# Patient Record
Sex: Female | Born: 1935
Health system: Southern US, Community
[De-identification: ages and names within clinical notes are randomized; demographics above are authoritative.]

## PROBLEM LIST (undated history)

## (undated) DIAGNOSIS — G629 Polyneuropathy, unspecified: Secondary | ICD-10-CM

## (undated) DIAGNOSIS — R350 Frequency of micturition: Secondary | ICD-10-CM

## (undated) DIAGNOSIS — R269 Unspecified abnormalities of gait and mobility: Secondary | ICD-10-CM

## (undated) DIAGNOSIS — E7800 Pure hypercholesterolemia, unspecified: Secondary | ICD-10-CM

## (undated) DIAGNOSIS — F419 Anxiety disorder, unspecified: Secondary | ICD-10-CM

## (undated) DIAGNOSIS — I10 Essential (primary) hypertension: Secondary | ICD-10-CM

## (undated) HISTORY — DX: Pure hypercholesterolemia, unspecified: E78.00

## (undated) HISTORY — DX: Polyneuropathy, unspecified: G62.9

## (undated) HISTORY — DX: Frequency of micturition: R35.0

## (undated) HISTORY — DX: Unspecified abnormalities of gait and mobility: R26.9

## (undated) HISTORY — DX: Essential (primary) hypertension: I10

## (undated) HISTORY — PX: CARPAL TUNNEL RELEASE: SHX101

## (undated) HISTORY — PX: TONSILLECTOMY AND ADENOIDECTOMY: SHX28

## (undated) HISTORY — PX: OTHER SURGICAL HISTORY: SHX169

## (undated) HISTORY — DX: Anxiety disorder, unspecified: F41.9

## (undated) HISTORY — PX: GANGLION CYST EXCISION: SHX1691

## (undated) HISTORY — PX: EYE SURGERY: SHX253

---

## 2002-02-15 ENCOUNTER — Other Ambulatory Visit: Admission: RE | Admit: 2002-02-15 | Discharge: 2002-02-15 | Payer: Self-pay | Admitting: Unknown Physician Specialty

## 2006-04-28 ENCOUNTER — Ambulatory Visit: Payer: Self-pay | Admitting: Internal Medicine

## 2006-04-28 ENCOUNTER — Ambulatory Visit (HOSPITAL_COMMUNITY): Admission: RE | Admit: 2006-04-28 | Discharge: 2006-04-28 | Payer: Self-pay | Admitting: Internal Medicine

## 2013-03-08 ENCOUNTER — Ambulatory Visit: Payer: Medicare Other | Admitting: Neurology

## 2013-03-08 ENCOUNTER — Encounter: Payer: Self-pay | Admitting: Neurology

## 2013-03-08 ENCOUNTER — Ambulatory Visit (INDEPENDENT_AMBULATORY_CARE_PROVIDER_SITE_OTHER): Payer: Medicare Other | Admitting: Neurology

## 2013-03-08 VITALS — BP 161/81 | HR 60 | Ht 60.0 in | Wt 213.0 lb

## 2013-03-08 DIAGNOSIS — R209 Unspecified disturbances of skin sensation: Secondary | ICD-10-CM

## 2013-03-08 DIAGNOSIS — Z90710 Acquired absence of both cervix and uterus: Secondary | ICD-10-CM

## 2013-03-08 DIAGNOSIS — R269 Unspecified abnormalities of gait and mobility: Secondary | ICD-10-CM

## 2013-03-08 DIAGNOSIS — R200 Anesthesia of skin: Secondary | ICD-10-CM

## 2013-03-08 NOTE — Progress Notes (Signed)
[facility]  PATIENT: Nicole Yoder DOB: [birth year]  HISTORICAL    Nicole Yoder is a 77 years old right-handed Caucasian female, referred by her primary care physician Dr. Manuella Ghazi,  and podiatrist Dr. Blanch Media for evaluation of bilateral feet paresthesia, worsening gait difficulty  She had a past medical history of obesity, hypertension, hyperlipidemia, chronic bilateral shoulder pain, limited range of motion of bilateral shoulder  In 2009, she presented with bilateral feet paresthesia numbness tingling of her feet, she was seen by local neurologists, reported abnormal nerve conduction EMG, consistent with a sensory predominant peripheral neuropathy, there was no evidence of diabetes, no etiology found,  Over the past 5 years, her feet paresthesia has gradually getting worse, not involving her whole feet, numbness tingling burning pain, getting worse after standing for 20-30 minutes, or walking.  She also has low back pain, but denies shooting pain to bilateral lower extremity, past one year, she also complains of bilateral hand swelling, joints pain, difficulty to make a tight grip, bilateral fingertips paresthesia,  She has urinary urgency, nocturnal frequent urination, which has improved after taking VESIcare, is no longer taking that,  She complains of neck pain, cracking sounds in her neck when she turns around, reported abnormal MRI of cervical more than 2 years ago, I do not have the report  REVIEW OF SYSTEMS: Full 14 system review of systems performed and notable only for fatigue, spinning sensation, itching, snoring, feeling hot, cold, flushing, joints pain, swelling, cramps, achy muscles,numbness, weakness, dizziness, passing out, snoring, anxiety, too much sleep, decreased energy   ALLERGIES: Allergies  Allergen Reactions  . Celebrex (Celecoxib)   . Codeine   . Ivp Dye (Iodinated Diagnostic Agents)   . Lyrica (Pregabalin)   . Neurontin (Gabapentin)   .  Penicillins   . Statins   . Sulfa Antibiotics   . Tramadol     HOME MEDICATIONS:  PAST MEDICAL HISTORY: Past Medical History  Diagnosis Date  . High blood pressure   . High cholesterol   . Anxiety        Hypothyroidism  PAST SURGICAL HISTORY: Past Surgical History  Procedure Laterality Date  . Eye surgery    . Tonsillectomy and adenoidectomy    . Carpal tunnel release Bilateral   . Ganglion cyst excision    . Vaginal hysterectomy    . Broken wrist    . Broken ribs      FAMILY HISTORY: History reviewed. No pertinent family history.  SOCIAL HISTORY:  History   Social History  . Marital Status: Single    Spouse Name: N/A    Number of Children: 0  . Years of Education: 12   Occupational History  .      retired   Social History Main Topics  . Smoking status: Never Smoker   . Smokeless tobacco: Never Used  . Alcohol Use: No  . Drug Use: No  . Sexually Active: Not on file   Other Topics Concern  . Not on file   Social History Narrative   Patient is retired and lives at home with her friend. Patient has a high school education. Patient is from [place].      Right handed.     PHYSICAL EXAM  Filed Vitals:   03/08/13 1007  BP: 161/81  Pulse: 60  Height: 5' (1.524 m)  Weight: 213 lb (96.616 kg)    Not recorded    Body mass index is 41.6 kg/(m^2).  PHYSICAL EXAMINATOINS:  Generalized: In no acute distress  Neck: Supple, no carotid bruits   Cardiac: Regular rate rhythm  Pulmonary: Clear to auscultation bilaterally  Musculoskeletal: No deformity  Neurological examination  Mentation: Alert oriented to time, place, history taking, and causual conversation, obese  Cranial nerve II-XII: Pupils were equal round reactive to light extraocular movements were full, visual field were full on confrontational test. facial sensation and strength were normal. hearing was intact to finger rubbing bilaterally. Uvula tongue midline.  head turning and  shoulder shrug and were normal and symmetric.Tongue protrusion into cheek strength was normal.  Motor: limited range of motion of bilateral shoulder, no significant bilateral upper and lower extremity proximal and distal weakness  Sensory: length dependent decreased fine touch, pinprick to distal shin,  Absent toe vibratory sensation, vibratory sensation is present at ankle level, and preserved proprioception at toes.  Coordination: Normal finger to nose, heel-to-shin bilaterally there was no truncal ataxia  Gait: Rising up from seated position without assistance, normal stance, wide based, cautious, unsteady gait, Romberg signs: Negative  Deep tendon reflexes: Brachioradialis 2/2, biceps 2/2, triceps 2/2, patellar 2/2, Achilles 2/2, plantar responses were flexor bilaterally.     Meds ordered this encounter  Medications  . sertraline (ZOLOFT) 100 MG tablet    Sig: Take 100 mg by mouth daily.  Marland Kitchen atenolol (TENORMIN) 25 MG tablet    Sig: Take 25 mg by mouth daily.  . Cholecalciferol (VITAMIN D PO)    Sig: Take by mouth daily.  . naproxen (NAPROSYN) 500 MG tablet    Sig: Take 500 mg by mouth 2 (two) times daily with a meal.  . levothyroxine (SYNTHROID, LEVOTHROID) 75 MCG tablet    Sig: Take 75 mcg by mouth daily before breakfast.  . omeprazole (PRILOSEC) 20 MG capsule    Sig: Take 20 mg by mouth daily.  . diclofenac sodium (VOLTAREN) 1 % GEL    Sig: Apply topically as directed.   Assessment and plan:  77 years old Caucasian female, with past medical history of hypertension, obesity hyperlipidemia, presenting with more than 5 years history of bilateral feet paresthesia, progressively worsening, over past 1 year, she also complains of worsening gait difficulty, on examination, she has obesity, length dependent sensory changes, but hyperreflexia,  1 her gait difficulty multifactor , including aging, obesity, deconditioning, peripheral neuropathy, likely a component of cervical spondylitic  myelopathy.  2 complete evaluation with MRI of cervical,  3 EMG nerve conduction study 4.  physical therapy . 5 laboratory evaluations .   [facility] [address] [place], [place] [address] [phone]

## 2013-03-17 DIAGNOSIS — R269 Unspecified abnormalities of gait and mobility: Secondary | ICD-10-CM

## 2013-03-19 ENCOUNTER — Other Ambulatory Visit: Payer: Self-pay | Admitting: Diagnostic Neuroimaging

## 2013-03-19 DIAGNOSIS — Z90710 Acquired absence of both cervix and uterus: Secondary | ICD-10-CM

## 2013-03-20 ENCOUNTER — Encounter (INDEPENDENT_AMBULATORY_CARE_PROVIDER_SITE_OTHER): Payer: Medicare Other | Admitting: Radiology

## 2013-03-20 ENCOUNTER — Ambulatory Visit (INDEPENDENT_AMBULATORY_CARE_PROVIDER_SITE_OTHER): Payer: Medicare Other | Admitting: Neurology

## 2013-03-20 DIAGNOSIS — Z90710 Acquired absence of both cervix and uterus: Secondary | ICD-10-CM

## 2013-03-20 DIAGNOSIS — R209 Unspecified disturbances of skin sensation: Secondary | ICD-10-CM

## 2013-03-20 DIAGNOSIS — R269 Unspecified abnormalities of gait and mobility: Secondary | ICD-10-CM

## 2013-03-20 DIAGNOSIS — R200 Anesthesia of skin: Secondary | ICD-10-CM

## 2013-03-20 DIAGNOSIS — Z0289 Encounter for other administrative examinations: Secondary | ICD-10-CM

## 2013-03-20 NOTE — Progress Notes (Signed)
Quick Note:  Will review film and discuss findings on follow up visit. ______

## 2013-03-21 ENCOUNTER — Telehealth: Payer: Self-pay | Admitting: Neurology

## 2013-03-21 NOTE — Procedures (Signed)
History of present illness:  77 years old female, presenting with more than one year history of bilateral feet paresthesia, nerve conduction EMG to evaluate peripheral neuropathy, or lumbosacral radiculopathy  Nerve conduction study: Bilateral sural sensory responses were normal, right side has normal snap amplitude, left-sided has mildly decreased snap amplitude. Bilateral tibial motor responses were normal. Bilateral peroneal toEDB motor response showed borderline or mildly decreased C. map amplitude. Left median, ulnar sensory and motor responses were normal.  Electromyography:  Selected needle examination was performed at left lower extremity muscles, and that lumbosacral paraspinal muscles.  Needle examination of left tibialis anterior, tibialis posterior, medial gastrocnemius, vastus lateralis, biceps femoris short head, was normal,  There was no spontaneous activity at left lumbosacral paraspinal muscles left L4, L5, S1  In conclusion:  This is a slight abnormal study, there is evidence of slight length dependent axonal peripheral neuropathy, there was no evidence of left lumbosacral radiculopathy.

## 2013-03-21 NOTE — Telephone Encounter (Signed)
Laboratory evaluation reviewed, there was normal CBC, elevated cholesterol 254, LDL 166, normal CMP: TSH, free T4.

## 2013-03-22 ENCOUNTER — Telehealth: Payer: Self-pay | Admitting: Neurology

## 2013-03-22 LAB — IFE AND PE, SERUM
Albumin SerPl Elph-Mcnc: 3.7 g/dL (ref 3.2–5.6)
Albumin/Glob SerPl: 1.4 (ref 0.7–2.0)
Alpha2 Glob SerPl Elph-Mcnc: 0.7 g/dL (ref 0.4–1.2)
B-Globulin SerPl Elph-Mcnc: 1 g/dL (ref 0.6–1.3)
IgM (Immunoglobulin M), Srm: 91 mg/dL (ref 40–230)
Total Protein: 6.5 g/dL (ref 6.0–8.5)

## 2013-03-22 LAB — FOLATE: Folate: 11.1 ng/mL (ref >3.0)

## 2013-03-22 LAB — RPR: RPR: NONREACTIVE

## 2013-03-22 LAB — VITAMIN B12: Vitamin B-12: 218 pg/mL (ref 211–946)

## 2013-03-22 LAB — ANA: Anti Nuclear Antibody(ANA): NEGATIVE

## 2013-03-22 NOTE — Telephone Encounter (Signed)
Please call patient, lab showed mildly elevated crp, otherwise normal, not significant findings.  MRI cervical, degenerative disease, no change in treatment plan

## 2013-03-23 ENCOUNTER — Encounter: Payer: Self-pay | Admitting: Neurology

## 2013-03-26 ENCOUNTER — Telehealth: Payer: Self-pay | Admitting: Neurology

## 2013-03-26 NOTE — Telephone Encounter (Signed)
I called and gave the results of MRI, and labs to sister of pt, Comer Locket.  Pt is to call back if needed.   No other tests at this time.

## 2013-04-25 ENCOUNTER — Other Ambulatory Visit: Payer: Self-pay

## 2013-07-10 ENCOUNTER — Ambulatory Visit (INDEPENDENT_AMBULATORY_CARE_PROVIDER_SITE_OTHER): Payer: Medicare Other | Admitting: Neurology

## 2013-07-10 ENCOUNTER — Telehealth: Payer: Self-pay | Admitting: Neurology

## 2013-07-10 ENCOUNTER — Encounter: Payer: Self-pay | Admitting: Neurology

## 2013-07-10 VITALS — BP 179/76 | HR 65 | Ht 61.0 in | Wt 215.0 lb

## 2013-07-10 DIAGNOSIS — R209 Unspecified disturbances of skin sensation: Secondary | ICD-10-CM

## 2013-07-10 DIAGNOSIS — R269 Unspecified abnormalities of gait and mobility: Secondary | ICD-10-CM

## 2013-07-10 DIAGNOSIS — R200 Anesthesia of skin: Secondary | ICD-10-CM

## 2013-07-10 NOTE — Progress Notes (Signed)
[facility]  PATIENT: Nicole Yoder DOB: [birth year]  HISTORICAL    Symphoni is a 77 years old right-handed Caucasian female, referred by her primary care physician Dr. Manuella Ghazi,  and podiatrist Dr. Blanch Media for evaluation of bilateral feet paresthesia, worsening gait difficulty  She had a past medical history of obesity, hypertension, hyperlipidemia, chronic bilateral shoulder pain, limited range of motion of bilateral shoulder  In 2009, she presented with bilateral feet paresthesia numbness tingling of her feet, she was seen by local neurologists, reported abnormal nerve conduction EMG, consistent with a sensory predominant peripheral neuropathy, there was no evidence of diabetes, no etiology found,  Over the past 5 years, her feet paresthesia has gradually getting worse, not involving her whole feet, numbness tingling burning pain, getting worse after standing for 20-30 minutes, or walking.  She also has low back pain, but denies shooting pain to bilateral lower extremity, past one year, she also complains of bilateral hand swelling, joints pain, difficulty to make a tight grip, bilateral fingertips paresthesia,  She has urinary urgency, nocturnal frequent urination, which has improved after taking VESIcare, is no longer taking that,  She complains of neck pain, cracking sounds in her neck when she turns around, reported abnormal MRI of cervical more than 2 years ago, I do not have the report  UPDATE 07/10/2013:  I have reviewed MRI cervical with her,  C5-6: uncovertebral joint hypertrophy and facet hypertrophy with moderate spinal stenosis and severe biforaminal foraminal stenosis.  C4-5, C6-7: uncovertebral joint hypertrophy and facet hypertrophy with mild-moderate spinal stenosis and severe biforaminal foraminal stenosis.  C3-4: uncovertebral joint hypertrophy and facet hypertrophy with mild spinal stenosis and severe biforaminal foraminal stenosis  She continued to  have mild gait difficulty, left foot hurts.  She denies bowel and bladder incontinence.   REVIEW OF SYSTEMS: Full 14 system review of systems performed and notable only for fatigue, spinning sensation, itching, snoring, feeling hot, cold, flushing, joints pain, swelling, cramps, achy muscles,numbness, weakness, dizziness, passing out, snoring, anxiety, too much sleep, decreased energy   ALLERGIES: Allergies  Allergen Reactions  . Celebrex [Celecoxib]   . Codeine   . Ivp Dye [Iodinated Diagnostic Agents]   . Lyrica [Pregabalin]   . Neurontin [Gabapentin]   . Penicillins   . Statins   . Sulfa Antibiotics   . Tramadol     HOME MEDICATIONS:  PAST MEDICAL HISTORY: Past Medical History  Diagnosis Date  . High blood pressure   . High cholesterol   . Anxiety        Hypothyroidism  PAST SURGICAL HISTORY: Past Surgical History  Procedure Laterality Date  . Eye surgery    . Tonsillectomy and adenoidectomy    . Carpal tunnel release Bilateral   . Ganglion cyst excision    . Vaginal hysterectomy    . Broken wrist    . Broken ribs      FAMILY HISTORY: History reviewed. No pertinent family history.  SOCIAL HISTORY:  History   Social History  . Marital Status: Single    Spouse Name: N/A    Number of Children: 0  . Years of Education: 12   Occupational History  .      retired   Social History Main Topics  . Smoking status: Never Smoker   . Smokeless tobacco: Never Used  . Alcohol Use: No  . Drug Use: No  . Sexual Activity: Not on file   Other Topics Concern  . Not on file   Social History  Narrative   Patient is retired and lives at home with her friend. Patient has a high school education. Patient is from [place].      Right handed.'   Caffeine- sometimes tea.     PHYSICAL EXAM  Filed Vitals:   07/10/13 1130  BP: 179/76  Pulse: 65  Height: 5\' 1"  (1.549 m)  Weight: 215 lb (97.523 kg)    Not recorded    Body mass index is 40.64  kg/(m^2).  PHYSICAL EXAMINATOINS:  Generalized: In no acute distress  Neck: Supple, no carotid bruits   Cardiac: Regular rate rhythm  Pulmonary: Clear to auscultation bilaterally  Musculoskeletal: No deformity  Neurological examination  Mentation: Alert oriented to time, place, history taking, and causual conversation, obese  Cranial nerve II-XII: Pupils were equal round reactive to light extraocular movements were full, visual field were full on confrontational test. facial sensation and strength were normal. hearing was intact to finger rubbing bilaterally. Uvula tongue midline.  head turning and shoulder shrug and were normal and symmetric.Tongue protrusion into cheek strength was normal.  Motor: limited range of motion of bilateral shoulder, no significant bilateral upper and lower extremity proximal and distal weakness  Sensory: length dependent decreased fine touch, pinprick to distal shin,  Absent toe vibratory sensation, vibratory sensation is present at ankle level, and preserved proprioception at toes.  Coordination: Normal finger to nose, heel-to-shin bilaterally there was no truncal ataxia  Gait: Rising up from seated position without assistance, normal stance, wide based, cautious, unsteady gait, Romberg signs: Negative  Deep tendon reflexes: Brachioradialis 3/3  biceps 3/3, triceps 2/2, patellar 3/3, Achilles trace , plantar responses were flexor bilaterally.     Meds ordered this encounter  Medications  . clindamycin (CLEOCIN) 150 MG capsule    Sig: Take 150 mg by mouth daily.   Assessment and plan:  77 years old Caucasian female, with past medical history of hypertension, obesity, hyperlipidemia, presenting with more than 5 years history of bilateral feet paresthesia, progressively worsening, over past 1 year, she also complains of worsening gait difficulty, on examination, she has obesity, length dependent sensory changes, but hyperreflexia, there is evidence  of cervical spondylitic disease, mild to moderate canal stenosis,   1 her gait difficulty multifactor , including aging, obesity, deconditioning, peripheral neuropathy, component of cervical spondylitic myelopathy.  2 . Continue morrhuate exercise, 3 laboratory showed multilevel B12, she should take B12 supplement.      [facility] [address], [place], [place] [address] [phone]

## 2013-07-10 NOTE — Telephone Encounter (Signed)
Please call patient for low b12, she needs overcounter b12 supplement.

## 2013-07-11 NOTE — Telephone Encounter (Signed)
I have called [facility],  she should take over counter vitamin B12 supplement. 1000 microgram one tab qday.

## 2013-07-20 ENCOUNTER — Encounter: Payer: Self-pay | Admitting: Nurse Practitioner

## 2013-07-20 DIAGNOSIS — R269 Unspecified abnormalities of gait and mobility: Secondary | ICD-10-CM

## 2013-07-24 NOTE — Telephone Encounter (Signed)
I called and LMVM for pt to return call.  Continue PT at [facility]?

## 2013-07-25 NOTE — Telephone Encounter (Signed)
Pt returned call yesterday.   I called today and no answer.

## 2013-07-25 NOTE — Telephone Encounter (Signed)
Pt returned call.  She had PT at [facility] and did note some improvement while doing this.   She is asking about more PT.  Looking at Dr. Rhea Belton note she did want her to continue.  Insurance coverage?  I told pt that order can be placed and sent to [facility].  They will confirm if this would be covered.  She was ok to proceed.

## 2013-07-26 ENCOUNTER — Other Ambulatory Visit: Payer: Self-pay

## 2013-07-30 ENCOUNTER — Telehealth: Payer: Self-pay | Admitting: Neurology

## 2013-07-30 NOTE — Telephone Encounter (Signed)
Called and left VM message for more information

## 2013-08-11 ENCOUNTER — Encounter: Payer: Self-pay | Admitting: Nurse Practitioner

## 2014-01-08 ENCOUNTER — Ambulatory Visit (INDEPENDENT_AMBULATORY_CARE_PROVIDER_SITE_OTHER): Payer: Medicare Other | Admitting: Nurse Practitioner

## 2014-01-08 ENCOUNTER — Ambulatory Visit: Payer: Medicare Other | Admitting: Nurse Practitioner

## 2014-01-08 ENCOUNTER — Encounter: Payer: Self-pay | Admitting: Nurse Practitioner

## 2014-01-08 VITALS — BP 151/67 | HR 65 | Ht 61.0 in | Wt 218.0 lb

## 2014-01-08 DIAGNOSIS — R209 Unspecified disturbances of skin sensation: Secondary | ICD-10-CM

## 2014-01-08 DIAGNOSIS — R200 Anesthesia of skin: Secondary | ICD-10-CM

## 2014-01-08 DIAGNOSIS — M4802 Spinal stenosis, cervical region: Secondary | ICD-10-CM | POA: Insufficient documentation

## 2014-01-08 DIAGNOSIS — R269 Unspecified abnormalities of gait and mobility: Secondary | ICD-10-CM

## 2014-01-08 NOTE — Patient Instructions (Signed)
Will set up for PT for cervical region Will set up for sleep study Continue to use cane for ambulation F/U in 6 months

## 2014-01-08 NOTE — Progress Notes (Signed)
[facility]  PATIENT: Nicole Yoder DOB: 1936/06/19   REASON FOR VISIT: Follow up for gait abnormality, peripheral neuropathy, cervical stenosis   HISTORY OF PRESENT ILLNESS: Nicole Yoder, 78 year old female returns for followup. She was last seen in this office 07/10/2013 by  Dr. Krista Blue. She has a five-year history of bilateral feet paresthesias and more difficulty with her gait EMG consistent with sensory predominant peripheral neuropathy without evidence of diabetes, no etiology was found. She has also had low vitamin B12 level and is taking a supplement. She also complains of neck stiffness and has had some problems with her driving and the ability to turn her neck . Her MRI of the neck  reviewed by Dr. Krista Blue shows mild to moderate cervical stenosis. She denies bowel incontinence, she has some stress incontinence. Her paresthesias in the feet are about the same. She had a fall this month and is now using a cane. She gets no regular exercise. She also complains of daytime drowsiness and says she snores at night. She claims she had a sleep study 10 years ago and was told that she needed to lose weight for treatment. She returns for reevaluation  HISTORY: evaluation of bilateral feet paresthesia, worsening gait difficulty  She had a past medical history of obesity, hypertension, hyperlipidemia, chronic bilateral shoulder pain, limited range of motion of bilateral shoulder  In 2009, she presented with bilateral feet paresthesia numbness tingling of her feet, she was seen by local neurologists, reported abnormal nerve conduction EMG, consistent with a sensory predominant peripheral neuropathy, there was no evidence of diabetes, no etiology found,  Over the past 5 years, her feet paresthesia has gradually getting worse, not involving her whole feet, numbness tingling burning pain, getting worse after standing for 20-30 minutes, or walking.  She also has low back pain, but denies  shooting pain to bilateral lower extremity, past one year, she also complains of bilateral hand swelling, joints pain, difficulty to make a tight grip, bilateral fingertips paresthesia,  She has urinary urgency, nocturnal frequent urination, which has improved after taking VESIcare, is no longer taking that,  She complains of neck pain, cracking sounds in her neck when she turns around, reported abnormal MRI of cervical more than 2 years ago, I do not have the report  UPDATE 07/10/2013:  I have reviewed MRI cervical with her, C5-6: uncovertebral joint hypertrophy and facet hypertrophy with moderate spinal stenosis and severe biforaminal foraminal stenosis. C4-5, C6-7: uncovertebral joint hypertrophy and facet hypertrophy with mild-moderate spinal stenosis and severe biforaminal foraminal stenosis. C3-4: uncovertebral joint hypertrophy and facet hypertrophy with mild spinal stenosis and severe biforaminal foraminal stenosis  She continued to have mild gait difficulty, left foot hurts. She denies bowel and bladder incontinence.   REVIEW OF SYSTEMS: Full 14 system review of systems performed and notable only for those listed, all others are neg:  Constitutional: Fatigue and Cardiovascular: Leg swelling Ear/Nose/Throat: N/A  Skin: N/A  Eyes: N/A  Respiratory: N/A  Gastroitestinal: Stress incontinence  Hematology/Lymphatic: N/A  Endocrine: Intolerance to heat  Musculoskeletal: Joint pain walking difficulty, neck pain  Allergy/Immunology: N/A  Neurological: Numbness, weakness Psychiatric: N/A   ALLERGIES: Allergies  Allergen Reactions  . Prednisone Other (See Comments)    High blood pressure   . Celebrex [Celecoxib]   . Codeine   . Ivp Dye [Iodinated Diagnostic Agents]   . Lyrica [Pregabalin]   . Neurontin [Gabapentin]   . Penicillins   . Statins   . Sulfa Antibiotics   .  Tramadol     HOME MEDICATIONS: Outpatient Prescriptions Prior to Visit  Medication Sig Dispense Refill  .  atenolol (TENORMIN) 25 MG tablet Take 25 mg by mouth daily.      . Cholecalciferol (VITAMIN D PO) Take by mouth daily.      . diclofenac sodium (VOLTAREN) 1 % GEL Apply topically as directed.      Marland Kitchen levothyroxine (SYNTHROID, LEVOTHROID) 75 MCG tablet Take 75 mcg by mouth daily before breakfast.      . naproxen (NAPROSYN) 500 MG tablet Take 500 mg by mouth 2 (two) times daily with a meal.      . omeprazole (PRILOSEC) 20 MG capsule Take 20 mg by mouth daily.      . sertraline (ZOLOFT) 100 MG tablet Take 100 mg by mouth daily.      . clindamycin (CLEOCIN) 150 MG capsule Take 150 mg by mouth daily.       No facility-administered medications prior to visit.    PAST MEDICAL HISTORY: Past Medical History  Diagnosis Date  . High blood pressure   . High cholesterol   . Anxiety     PAST SURGICAL HISTORY: Past Surgical History  Procedure Laterality Date  . Eye surgery    . Tonsillectomy and adenoidectomy    . Carpal tunnel release Bilateral   . Ganglion cyst excision    . Vaginal hysterectomy    . Broken wrist    . Broken ribs      FAMILY HISTORY: History reviewed. No pertinent family history.  SOCIAL HISTORY: History   Social History  . Marital Status: Single    Spouse Name: N/A    Number of Children: 0  . Years of Education: 12   Occupational History  .      retired   Social History Main Topics  . Smoking status: Never Smoker   . Smokeless tobacco: Never Used  . Alcohol Use: No  . Drug Use: No  . Sexual Activity: Not on file   Other Topics Concern  . Not on file   Social History Narrative   Patient is retired and lives at home with her friend. Patient has a high school education. Patient is from [place].      Right handed.'   Caffeine- sometimes tea.     PHYSICAL EXAM  Filed Vitals:   01/08/14 0913  BP: 151/67  Pulse: 65  Height: 5\' 1"  (1.549 m)  Weight: 218 lb (98.884 kg)   Body mass index is 41.21 kg/(m^2).  Generalized: Well developed, morbidly  obese female in no acute distress  Head: normocephalic and atraumatic,. mallopatti 4 Neck: Decreased range of motion , no carotid bruits , neck size 17.  Cardiac: Regular rate rhythm, no murmur  Musculoskeletal: No deformity   Neurological examination   Mentation: Alert oriented to time, place, history taking. Follows all commands speech and language fluent. ESS 12.   Cranial nerve II-XII: Pupils were equal round reactive to light extraocular movements were full, visual field were full on confrontational test. Facial sensation and strength were normal. hearing was intact to finger rubbing bilaterally. Uvula tongue midline. Tongue protrusion into cheek strength was normal. Motor: Limited range of motion of bilateral shoulder,  normal bulk and tone, full strength in the BUE, BLE, . No focal weakness Sensory: decreased fine touch and pinprick to distal shin. Absent vibratory to ankles and preserved proprioception.  Coordination: finger-nose-finger, heel-to-shin bilaterally, no dysmetria Reflexes: Brachioradialis 3/3, biceps 3/3, triceps 2/2, patellar 2/2, Achilles trace  plantar responses were flexor bilaterally. Gait and Station: Rising up from seated position without assistance, wide based  stance,  cautious unsteady gait, Romberg negative  DIAGNOSTIC DATA (LABS, IMAGING, TESTING)  ASSESSMENT AND PLAN  78 y.o. year old female  has a past medical history of High blood pressure; High cholesterol; and obesity, bilateral feet paresthesias which have worsened over 5 years with length dependent sensory changes,  hyperreflexia, gait abnormality, daytime drowsiness and cervical spondylitic disease mild to moderate canal stenosis.  Will set up for PT for cervical region Will set up for sleep study Continue to use cane for ambulation F/U in 6 months Nicole Yoder, [facility], [facility] [address] [place], [place] [address] [phone]

## 2014-01-10 ENCOUNTER — Encounter: Payer: Self-pay | Admitting: Nurse Practitioner

## 2014-01-14 ENCOUNTER — Encounter: Payer: Self-pay | Admitting: Nurse Practitioner

## 2014-01-17 ENCOUNTER — Telehealth: Payer: Self-pay | Admitting: Neurology

## 2014-01-17 DIAGNOSIS — G4733 Obstructive sleep apnea (adult) (pediatric): Secondary | ICD-10-CM

## 2014-01-17 DIAGNOSIS — R400 Somnolence: Secondary | ICD-10-CM

## 2014-01-17 NOTE — Telephone Encounter (Signed)
Sleep study request review: This patient has an underlying medical history of BC, pressure, hyperlipidemia, and is referred by Dr. Krista Blue and Cecille Rubin for an attended sleep study due to a report of snoring, prior diagnosis of sleep apnea and excessive daytime somnolence with an Epworth of 19. I will order a split-night sleep study and see the patient in sleep medicine consultation afterwards. Star Age, MD, PhD [facility])

## 2014-01-17 NOTE — Telephone Encounter (Signed)
Nicole Sauer Martin,NP/Dr. Krista Blue.. refers patient for attended sleep study.  Height: 5'1  Weight: 218 lbs.  BMI: 41.21  Past Medical History:  High blood pressure  High cholesterol  Anxiety   Sleep Symptoms:  She also complains of daytime drowsiness and says she snores at night. She claims she had a sleep study 10 years ago and was told that she needed to lose weight for treatment.  Epworth Score: 19 ( I called the patient to get her score).  Medications: atenolol (TENORMIN) 25 MG tablet  Take 25 mg by mouth daily.  .  Cholecalciferol (VITAMIN D PO)  Take by mouth daily.  .  diclofenac sodium (VOLTAREN) 1 % GEL  Apply topically as directed.  Marland Kitchen  levothyroxine (SYNTHROID, LEVOTHROID) 75 MCG tablet  Take 75 mcg by mouth daily before breakfast.  .  naproxen (NAPROSYN) 500 MG tablet  Take 500 mg by mouth 2 (two) times daily with a meal.  .  omeprazole (PRILOSEC) 20 MG capsule  Take 20 mg by mouth daily.  .  sertraline (ZOLOFT) 100 MG tablet  Take 100 mg by mouth daily.  .  clindamycin (CLEOCIN) 150 MG capsule  Take 150 mg by mouth daily.     Insurance: Medicare/[employer]  [name]'s Assessment and Plan: 78 y.o. year old female has a past medical history of High blood pressure; High cholesterol; and obesity, bilateral feet paresthesias which have worsened over 5 years with length dependent sensory changes, hyperreflexia, gait abnormality, daytime drowsiness and cervical spondylitic disease mild to moderate canal stenosis.   Will set up for PT for cervical region  Will set up for sleep study  Continue to use cane for ambulation  F/U in 6 months      Please review patient information and submit instructions for scheduling and orders for sleep technologist.  Thank you!

## 2014-02-06 ENCOUNTER — Telehealth: Payer: Self-pay | Admitting: Nurse Practitioner

## 2014-02-06 NOTE — Telephone Encounter (Signed)
Called [place] at [facility] and she stated that she just wanted Hoyle Sauer, NP be aware that pt's neck is getting better and they are going to continue to work with her but they believe pt might have a rotator cuff tear. They also stated that they had faxed over paperwork and wanted to know if our office has received it yet. FYI

## 2014-02-06 NOTE — Telephone Encounter (Signed)
Reviewed PT POC and faxed back

## 2014-02-06 NOTE — Telephone Encounter (Signed)
Pt is seen there and her neck is getting better but they think she has a rotator cuff tear please call

## 2014-02-14 ENCOUNTER — Telehealth: Payer: Self-pay | Admitting: Nurse Practitioner

## 2014-02-14 NOTE — Telephone Encounter (Signed)
Pt called to cancel her sleep study appointment because she says that her primary care physician told her that she didn't need it.  Advised patient that notification would be sent to the referring provider.

## 2014-06-25 ENCOUNTER — Encounter (INDEPENDENT_AMBULATORY_CARE_PROVIDER_SITE_OTHER): Payer: Self-pay | Admitting: *Deleted

## 2014-07-10 ENCOUNTER — Ambulatory Visit (INDEPENDENT_AMBULATORY_CARE_PROVIDER_SITE_OTHER): Payer: Self-pay | Admitting: Internal Medicine

## 2014-07-11 ENCOUNTER — Ambulatory Visit (INDEPENDENT_AMBULATORY_CARE_PROVIDER_SITE_OTHER): Payer: Medicare Other | Admitting: Nurse Practitioner

## 2014-07-11 ENCOUNTER — Encounter: Payer: Self-pay | Admitting: Nurse Practitioner

## 2014-07-11 VITALS — BP 128/65 | HR 64 | Temp 97.0°F | Ht 61.0 in | Wt 212.0 lb

## 2014-07-11 DIAGNOSIS — R269 Unspecified abnormalities of gait and mobility: Secondary | ICD-10-CM

## 2014-07-11 DIAGNOSIS — M4802 Spinal stenosis, cervical region: Secondary | ICD-10-CM

## 2014-07-11 DIAGNOSIS — R200 Anesthesia of skin: Secondary | ICD-10-CM

## 2014-07-11 NOTE — Patient Instructions (Addendum)
I would like you to start back with Physical Therapy for Gait and balance training.  You may do this in [place].    Continue to be very careful and use your cane at all times for safety.  Please see Dr. Derenda Mis for your hot flashes, as it is unusual to start having them again at your age.  Follow up with Dr. Krista Blue in 3-4 months, sooner as needed.

## 2014-07-11 NOTE — Progress Notes (Signed)
PATIENT: Nicole Yoder DOB: [birth year]  REASON FOR VISIT: Follow up for gait abnormality, peripheral neuropathy, cervical stenosis  HISTORY FROM: patient  HISTORY OF PRESENT ILLNESS: Nicole Yoder, 78 year old female returns for followup. She was last seen in this office 07/10/2013 by Dr. Krista Blue. She has a five-year history of bilateral feet paresthesias and more difficulty with her gait EMG consistent with sensory predominant peripheral neuropathy without evidence of diabetes, no etiology was found.   UPDATE 07/11/14 (LL): Since last visit patient went through physical therapy for her neck stiffness and decreased range of motion which she thinks helps somewhat. The therapist thought she may have a left rotator cuff tear. She was seen by orthopedics who told her she has arthritis in the joint. She continues to feel very off balance and is using a cane now at all times. She cancelled her sleep study because she states that her PCP told her she didn't need it. She states that she really does not want to do it anyway. She states that if she is on her feet prolonged period of time that she feels like her feet will give out under her. She has burning pain in the left foot more than the right but only during the day, not during the nighttime when she's in bed. She states that she sleeps very well. She states she does not exercise, in the past she enjoyed water aerobics classes but now does not want to participate because she fears she could not control her urine incontinence. She denies any fall since last visit. She states she started experiencing hot flashes about a month ago which are intermittent but puzzling because she had gone many years without them and has been off hormone replacement for many years as well. Her TSH was checked recently and normal.  UPDATE 01/08/14 (CM): She has also had low vitamin B12 level and is taking a supplement. She also complains of neck stiffness and has had some  problems with her driving and the ability to turn her neck . Her MRI of the neck reviewed by Dr. Krista Blue shows mild to moderate cervical stenosis. She denies bowel incontinence, she has some stress incontinence. Her paresthesias in the feet are about the same. She had a fall this month and is now using a cane. She gets no regular exercise. She also complains of daytime drowsiness and says she snores at night. She claims she had a sleep study 10 years ago and was told that she needed to lose weight for treatment. She returns for reevaluation.  UPDATE 07/10/2013 (YY):  I have reviewed MRI cervical with her, C5-6: uncovertebral joint hypertrophy and facet hypertrophy with moderate spinal stenosis and severe biforaminal foraminal stenosis. C4-5, C6-7: uncovertebral joint hypertrophy and facet hypertrophy with mild-moderate spinal stenosis and severe biforaminal foraminal stenosis. C3-4: uncovertebral joint hypertrophy and facet hypertrophy with mild spinal stenosis and severe biforaminal foraminal stenosis  She continued to have mild gait difficulty, left foot hurts. She denies bowel and bladder incontinence.   HISTORY: evaluation of bilateral feet paresthesia, worsening gait difficulty  She had a past medical history of obesity, hypertension, hyperlipidemia, chronic bilateral shoulder pain, limited range of motion of bilateral shoulder  In 2009, she presented with bilateral feet paresthesia numbness tingling of her feet, she was seen by local neurologists, reported abnormal nerve conduction EMG, consistent with a sensory predominant peripheral neuropathy, there was no evidence of diabetes, no etiology found,  Over the past 5 years, her feet paresthesia has  gradually getting worse, not involving her whole feet, numbness tingling burning pain, getting worse after standing for 20-30 minutes, or walking.  She also has low back pain, but denies shooting pain to bilateral lower extremity, past one year, she also  complains of bilateral hand swelling, joints pain, difficulty to make a tight grip, bilateral fingertips paresthesia,  She has urinary urgency, nocturnal frequent urination, which has improved after taking VESIcare, is no longer taking that,  She complains of neck pain, cracking sounds in her neck when she turns around, reported abnormal MRI of cervical more than 2 years ago, I do not have the report   REVIEW OF SYSTEMS: Full 14 system review of systems performed and notable only for:    ALLERGIES: Allergies  Allergen Reactions  . Prednisone Other (See Comments)    High blood pressure   . Celebrex [Celecoxib]   . Codeine   . Ivp Dye [Iodinated Diagnostic Agents]   . Lyrica [Pregabalin]   . Neurontin [Gabapentin]   . Penicillins   . Statins   . Sulfa Antibiotics   . Tramadol     HOME MEDICATIONS: Outpatient Prescriptions Prior to Visit  Medication Sig Dispense Refill  . atenolol (TENORMIN) 25 MG tablet Take 25 mg by mouth daily.      . Cholecalciferol (VITAMIN D PO) Take 1,000 Units by mouth daily.       . Cyanocobalamin (VITAMIN B 12) 100 MCG LOZG Take 1,000 mcg by mouth daily.      . diclofenac sodium (VOLTAREN) 1 % GEL Apply topically as directed.      Marland Kitchen levothyroxine (SYNTHROID, LEVOTHROID) 75 MCG tablet Take 75 mcg by mouth daily before breakfast.      . sertraline (ZOLOFT) 100 MG tablet Take 100 mg by mouth daily.      Marland Kitchen allopurinol (ZYLOPRIM) 300 MG tablet       . naproxen (NAPROSYN) 500 MG tablet Take 500 mg by mouth 2 (two) times daily with a meal.      . omeprazole (PRILOSEC) 20 MG capsule Take 20 mg by mouth daily.       No facility-administered medications prior to visit.    PHYSICAL EXAM Filed Vitals:   07/11/14 0959  BP: 128/65  Pulse: 64  Temp: 97 F (36.1 C)  TempSrc: Oral  Height: 5\' 1"  (1.549 m)  Weight: 212 lb (96.163 kg)   Body mass index is 40.08 kg/(m^2).  Generalized: Well developed, morbidly obese female in no acute distress  Head:  normocephalic and atraumatic,. mallopatti 4  Neck: Decreased range of motion, no carotid bruits , neck size 17.  Cardiac: Regular rate rhythm, no murmur  Musculoskeletal: No deformity   Neurological examination  Mentation: Alert oriented to time, place, history taking. Follows all commands speech and language fluent.  Cranial nerve II-XII: Pupils were equal round reactive to light extraocular movements were full, visual field were full on confrontational test. Facial sensation and strength were normal. hearing was intact to finger rubbing bilaterally. Uvula tongue midline. Tongue protrusion into cheek strength was normal.  Motor: Limited range of motion of bilateral shoulder, normal bulk and tone, full strength in the BUE, BLE, . No focal weakness  Sensory: decreased fine touch and pinprick to distal shin. Absent vibratory to ankles and preserved proprioception.  Coordination: finger-nose-finger, heel-to-shin bilaterally, no dysmetria  Reflexes: Brachioradialis 3/3, biceps 3/3, triceps 2/2, patellar 2/2, Achilles trace plantar responses were flexor bilaterally.  Gait and Station: Rising up from seated position without assistance, wide  based stance, cautious unsteady gait, Romberg negative   ASSESSMENT: 78 y.o. year old female has a past medical history of High blood pressure; High cholesterol; and obesity, bilateral feet paresthesias which have worsened over 5 years with length dependent sensory changes, hyperreflexia, gait abnormality, daytime drowsiness and cervical spondylitic disease mild to moderate canal stenosis.   PLAN: I would like you to start back with Physical Therapy for Gait and balance training.  Continue to be very careful and use your cane at all times for safety. Please see your gynecologist Dr. Derenda Mis for your hot flashes, as it is unusual to start having them again at your age. Follow up with Dr. Krista Blue in 3-4 months, sooner as needed.  Orders Placed This Encounter    Procedures  . AMB referral to gait training for fall prevention   LYNN E. LAM, MSN, FNP-BC, A/GNP-C 07/11/2014, 11:06 AM [facility] [address] [place], [place] [address] [phone]  Note: This document was prepared with digital dictation and possible smart phrase technology. Any transcriptional errors that result from this process are unintentional.

## 2014-07-12 ENCOUNTER — Telehealth: Payer: Self-pay | Admitting: Nurse Practitioner

## 2014-07-12 NOTE — Telephone Encounter (Signed)
Patient calling to check on the status of her physical therapy referral to [facility], please return call and advise.

## 2014-07-18 NOTE — Telephone Encounter (Signed)
Spoke with sister (Irene-DPR) and inform that the referral to [facility] hospital has been sent on 07/15/14 and she said that she would give message to patient

## 2014-07-30 ENCOUNTER — Ambulatory Visit (INDEPENDENT_AMBULATORY_CARE_PROVIDER_SITE_OTHER): Payer: Medicare Other | Admitting: Internal Medicine

## 2014-07-30 ENCOUNTER — Encounter (INDEPENDENT_AMBULATORY_CARE_PROVIDER_SITE_OTHER): Payer: Self-pay | Admitting: Internal Medicine

## 2014-07-30 ENCOUNTER — Other Ambulatory Visit (INDEPENDENT_AMBULATORY_CARE_PROVIDER_SITE_OTHER): Payer: Self-pay | Admitting: *Deleted

## 2014-07-30 ENCOUNTER — Encounter (INDEPENDENT_AMBULATORY_CARE_PROVIDER_SITE_OTHER): Payer: Self-pay | Admitting: *Deleted

## 2014-07-30 VITALS — BP 134/70 | HR 72 | Temp 98.1°F | Ht 61.0 in | Wt 212.8 lb

## 2014-07-30 DIAGNOSIS — R1314 Dysphagia, pharyngoesophageal phase: Secondary | ICD-10-CM | POA: Insufficient documentation

## 2014-07-30 DIAGNOSIS — R1310 Dysphagia, unspecified: Secondary | ICD-10-CM

## 2014-07-30 NOTE — Progress Notes (Signed)
   Subjective:    Patient ID: Nicole Yoder, female    DOB: July 11, 1936, 78 y.o.   MRN: [id number]  HPI Referred to our office bvy Dr. Adriana Reams for dysphagia. She tell me she is having trouble swallowing her pills. Occasionally she will have trouble swallowing breads.  She tells me she has no trouble swallowing meats, though she rarely eats it. Symptoms for about a year.  She does have acid reflux 2-3 times a week at least. She avoids fried foods and spicy foods. Appetite is good.She has lost about 8-9 pounds over the past 8 months unintentional. . No abdominal pain.  She usually a BM daily.     Review of Systems Past Medical History  Diagnosis Date  . High blood pressure   . High cholesterol   . Anxiety     Past Surgical History  Procedure Laterality Date  . Eye surgery    . Tonsillectomy and adenoidectomy    . Carpal tunnel release Bilateral   . Ganglion cyst excision    . Broken wrist    . Broken ribs      Allergies  Allergen Reactions  . Prednisone Other (See Comments)    High blood pressure   . Celebrex [Celecoxib]   . Codeine   . Ivp Dye [Iodinated Diagnostic Agents]   . Lyrica [Pregabalin]   . Neurontin [Gabapentin]   . Penicillins   . Statins   . Sulfa Antibiotics   . Tramadol     Current Outpatient Prescriptions on File Prior to Visit  Medication Sig Dispense Refill  . atenolol (TENORMIN) 25 MG tablet Take 25 mg by mouth daily.    . Cholecalciferol (VITAMIN D PO) Take 1,000 Units by mouth daily.     . Cyanocobalamin (VITAMIN B 12) 100 MCG LOZG Take 1,000 mcg by mouth daily.    . diclofenac sodium (VOLTAREN) 1 % GEL Apply topically as directed.    Marland Kitchen levothyroxine (SYNTHROID, LEVOTHROID) 75 MCG tablet Take 75 mcg by mouth daily before breakfast.    . naproxen sodium (ALEVE) 220 MG tablet Take 440 mg by mouth 2 (two) times daily with a meal.     . sertraline (ZOLOFT) 100 MG tablet Take 100 mg by mouth daily.     No current facility-administered  medications on file prior to visit.        Objective:   Physical Exam  Filed Vitals:   07/30/14 1104  Height: 5\' 1"  (1.549 m)  Weight: 212 lb 12.8 oz (96.525 kg)   Alert and oriented. Skin warm and dry. Oral mucosa is moist.   . Sclera anicteric, conjunctivae is pink. Thyroid not enlarged. No cervical lymphadenopathy. Lungs clear. Heart regular rate and rhythm.  Abdomen is soft. Bowel sounds are positive. No hepatomegaly. No abdominal masses felt. No tenderness.  No edema to lower extremities.        Assessment & Plan:  Solid and pill dysphagia. Web needs to be ruled out. EGD/ED GERD Continue the Omeprazole daily 30 minutes before breakfast.

## 2014-07-30 NOTE — Patient Instructions (Signed)
EGD/ED. The risks and benefits such as perforation, bleeding, and infection were reviewed with the patient and is agreeable. 

## 2014-08-22 ENCOUNTER — Ambulatory Visit (HOSPITAL_COMMUNITY)
Admission: RE | Admit: 2014-08-22 | Discharge: 2014-08-22 | Disposition: A | Discharge status: Home / Self Care | Payer: Medicare Other | Source: Ambulatory Visit | Attending: Internal Medicine | Admitting: Internal Medicine

## 2014-08-22 ENCOUNTER — Encounter (HOSPITAL_COMMUNITY)
Admission: RE | Disposition: A | Discharge status: Home / Self Care | Payer: Self-pay | Source: Ambulatory Visit | Attending: Internal Medicine

## 2014-08-22 ENCOUNTER — Encounter (HOSPITAL_COMMUNITY): Payer: Self-pay | Admitting: *Deleted

## 2014-08-22 DIAGNOSIS — I10 Essential (primary) hypertension: Secondary | ICD-10-CM | POA: Diagnosis not present

## 2014-08-22 DIAGNOSIS — K210 Gastro-esophageal reflux disease with esophagitis: Secondary | ICD-10-CM | POA: Insufficient documentation

## 2014-08-22 DIAGNOSIS — K2960 Other gastritis without bleeding: Secondary | ICD-10-CM | POA: Diagnosis not present

## 2014-08-22 DIAGNOSIS — R1310 Dysphagia, unspecified: Secondary | ICD-10-CM | POA: Insufficient documentation

## 2014-08-22 DIAGNOSIS — Z79899 Other long term (current) drug therapy: Secondary | ICD-10-CM | POA: Diagnosis not present

## 2014-08-22 DIAGNOSIS — K2971 Gastritis, unspecified, with bleeding: Secondary | ICD-10-CM

## 2014-08-22 DIAGNOSIS — K289 Gastrojejunal ulcer, unspecified as acute or chronic, without hemorrhage or perforation: Secondary | ICD-10-CM

## 2014-08-22 DIAGNOSIS — K449 Diaphragmatic hernia without obstruction or gangrene: Secondary | ICD-10-CM

## 2014-08-22 DIAGNOSIS — E780 Pure hypercholesterolemia: Secondary | ICD-10-CM | POA: Insufficient documentation

## 2014-08-22 DIAGNOSIS — K2210 Ulcer of esophagus without bleeding: Secondary | ICD-10-CM | POA: Diagnosis not present

## 2014-08-22 SURGERY — EGD (ESOPHAGOGASTRODUODENOSCOPY)
Anesthesia: Moderate Sedation

## 2014-08-22 MED ORDER — STERILE WATER FOR IRRIGATION IR SOLN
Status: DC | PRN
  Administered 2014-08-22: 15:00:00

## 2014-08-22 MED ORDER — BUTAMBEN-TETRACAINE-BENZOCAINE 2-2-14 % EX AERO
INHALATION_SPRAY | CUTANEOUS | Status: DC | PRN
  Administered 2014-08-22: 2 via TOPICAL

## 2014-08-22 MED ORDER — MEPERIDINE HCL 50 MG/ML IJ SOLN
INTRAMUSCULAR | Status: AC
  Filled 2014-08-22: qty 1

## 2014-08-22 MED ORDER — PANTOPRAZOLE SODIUM 40 MG PO TBEC: 40.0000 mg | DELAYED_RELEASE_TABLET | Freq: Every day | ORAL | Status: DC

## 2014-08-22 MED ORDER — MIDAZOLAM HCL 5 MG/5ML IJ SOLN
INTRAMUSCULAR | Status: AC
  Filled 2014-08-22: qty 10

## 2014-08-22 MED ORDER — SODIUM CHLORIDE 0.9 % IV SOLN
INTRAVENOUS | Status: DC
  Administered 2014-08-22: 1000 mL via INTRAVENOUS

## 2014-08-22 MED ORDER — MIDAZOLAM HCL 5 MG/5ML IJ SOLN
INTRAMUSCULAR | Status: DC | PRN
  Administered 2014-08-22 (×2): 2 mg via INTRAVENOUS
  Administered 2014-08-22: 1 mg via INTRAVENOUS

## 2014-08-22 MED ORDER — MEPERIDINE HCL 50 MG/ML IJ SOLN
INTRAMUSCULAR | Status: DC | PRN
  Administered 2014-08-22: 25 mg via INTRAVENOUS

## 2014-08-22 NOTE — H&P (Signed)
Nicole Yoder is an 78 y.o. female.   Chief Complaint: Patient is here for EGD and ED. 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 This symptom has occurred frequently over the last 6 months. She's not had any episode of food impaction. She has heartburn only with certain foods. She has good appetite. She has however cutback and has lost about 10 pounds this year. She denies melena or rectal bleeding.  Past Medical History  Diagnosis Date  . High blood pressure   . High cholesterol   . Anxiety     Past Surgical History  Procedure Laterality Date  . Eye surgery    . Tonsillectomy and adenoidectomy    . Carpal tunnel release Bilateral   . Ganglion cyst excision    . Broken wrist    . Broken ribs      History reviewed. No pertinent family history. Social History:  reports that she has never smoked. She has never used smokeless tobacco. She reports that she does not drink alcohol or use illicit drugs.  Allergies:  Allergies  Allergen Reactions  . Prednisone Other (See Comments)    High blood pressure   . Celebrex [Celecoxib]   . Codeine   . Ivp Dye [Iodinated Diagnostic Agents]   . Lyrica [Pregabalin]   . Neurontin [Gabapentin]   . Penicillins   . Statins   . Sulfa Antibiotics   . Tramadol     Medications Prior to Admission  Medication Sig Dispense Refill  . acetaminophen (TYLENOL) 500 MG tablet Take 650 mg by mouth 2 (two) times daily.    Marland Kitchen atenolol (TENORMIN) 25 MG tablet Take 25 mg by mouth daily.    . Cholecalciferol (VITAMIN D PO) Take 1,000 Units by mouth daily.     . Cyanocobalamin (VITAMIN B 12) 100 MCG LOZG Take 1,000 mcg by mouth daily.    . diclofenac sodium (VOLTAREN) 1 % GEL Apply topically as directed.    Marland Kitchen levothyroxine (SYNTHROID, LEVOTHROID) 75 MCG tablet Take 75 mcg by mouth daily before breakfast.    . naproxen sodium (ALEVE) 220 MG tablet Take 440 mg by mouth  2 (two) times daily with a meal.     . sertraline (ZOLOFT) 100 MG tablet Take 100 mg by mouth daily.      No results found for this or any previous visit (from the past 48 hour(s)). No results found.  ROS  Blood pressure 110/79, pulse 64, temperature 97.7 F (36.5 C), temperature source Oral, resp. rate 20, height 5\' 1"  (1.549 m), weight 211 lb (95.709 kg), SpO2 98 %. Physical Exam  Constitutional: She appears well-developed and well-nourished.  HENT:  Mouth/Throat: Oropharynx is clear and moist.  Eyes: Conjunctivae are normal. No scleral icterus.  Neck: No thyromegaly present.  Cardiovascular: Normal rate, regular rhythm and normal heart sounds.   No murmur heard. Respiratory: Effort normal and breath sounds normal.  GI: Soft. She exhibits no distension and no mass. There is no tenderness.  Musculoskeletal: She exhibits no edema.  Lymphadenopathy:    She has no cervical adenopathy.  Neurological: She is alert.  Skin: Skin is warm and dry.     Assessment/Plan Solid food dysphagia. EGD and ED.  REHMAN,NAJEEB U 08/22/2014, 3:12 PM

## 2014-08-22 NOTE — Discharge Instructions (Signed)
Resume usual medications and diet. Pantoprazole 40 mg by mouth 30 minutes before breakfast daily. Can take Colace 200 mg by mouth daily and Metamucil 4 g by mouth daily at bedtime. No driving for 24 hours. Physician will call with CLOtest result.  Gastrointestinal Endoscopy, Care After Refer to this sheet in the next few weeks. These instructions provide you with information on caring for yourself after your procedure. Your caregiver may also give you more specific instructions. Your treatment has been planned according to current medical practices, but problems sometimes occur. Call your caregiver if you have any problems or questions after your procedure. HOME CARE INSTRUCTIONS  If you were given medicine to help you relax (sedative), do not drive, operate machinery, or sign important documents for 24 hours.  Avoid alcohol and hot or warm beverages for the first 24 hours after the procedure.  Only take over-the-counter or prescription medicines for pain, discomfort, or fever as directed by your caregiver. You may resume taking your normal medicines unless your caregiver tells you otherwise. Ask your caregiver when you may resume taking medicines that may cause bleeding, such as aspirin, clopidogrel, or warfarin.  You may return to your normal diet and activities on the day after your procedure, or as directed by your caregiver. Walking may help to reduce any bloated feeling in your abdomen.  Drink enough fluids to keep your urine clear or pale yellow.  You may gargle with salt water if you have a sore throat. SEEK IMMEDIATE MEDICAL CARE IF:  You have severe nausea or vomiting.  You have severe abdominal pain, abdominal cramps that last longer than 6 hours, or abdominal swelling (distention).  You have severe shoulder or back pain.  You have trouble swallowing.  You have shortness of breath, your breathing is shallow, or you are breathing faster than normal.  You have a fever or a  rapid heartbeat.  You vomit blood or material that looks like coffee grounds.  You have bloody, black, or tarry stools. MAKE SURE YOU:  Understand these instructions.  Will watch your condition.  Will get help right away if you are not doing well or get worse. Document Released: 04/20/2004 Document Revised: 01/21/2014 Document Reviewed: 12/07/2011 [facility] Patient Information 2015 [place], [place]. This information is not intended to replace advice given to you by your health care provider. Make sure you discuss any questions you have with your health care provider.

## 2014-08-22 NOTE — Op Note (Signed)
EGD PROCEDURE REPORT  PATIENT:  Nicole Yoder  MR#:  [id number] Birthdate:  1936/01/30, 78 y.o., female Endoscopist:  Dr. Rogene Houston, MD Referred By:  Dr. Barrington Ellison, MD Procedure Date: 08/22/2014  Procedure:   EGD with ED  Indications:  Patient is 78 year old Caucasian female who presents with intermittent solid food dysphagia. She has heartburn only with certain foods. She denies nausea vomiting abdominal pain or melena.            Informed Consent:  The risks, benefits, alternatives & imponderables which include, but are not limited to, bleeding, infection, perforation, drug reaction and potential missed lesion have been reviewed.  The potential for biopsy, lesion removal, esophageal dilation, etc. have also been discussed.  Questions have been answered.  All parties agreeable.  Please see history & physical in medical record for more information.  Medications:  Demerol 25 mg IV Versed 5 mg IV Cetacaine spray topically for oropharyngeal anesthesia  Description of procedure:  The endoscope was introduced through the mouth and advanced to the second portion of the duodenum without difficulty or limitations. The mucosal surfaces were surveyed very carefully during advancement of the scope and upon withdrawal.  Findings:  Esophagus:  Mucosa of the esophagus was normal. Single linear erosion noted at GE junction. GEJ:  40 cm Hiatus:  38 cm Stomach:  Stomach was empty and distended very well with insufflation. Folds in the proximal stomach were normal. Examination mucosa at gastric body was normal. Patchy and linear erythema noted antral mucosa along with scar at angularis and prepyloric region. Two small ulcers about 3 mm in size also noted in prepyloric region and appeared to be healing. Pyloric channel was patent. Fundus and cardia were unremarkable. Duodenum:  Normal bulbar and post bulbar mucosa.  Therapeutic/Diagnostic Maneuvers Performed:   Esophagus was dilated by passing  56 [place] Maloney dilator to full insertion. Esophageal mucosa was examined post dilation and no mucosal disruption noted. Antral biopsy was taken for CLOtest and endoscope was removed.   Complications:  None  Impression: Erosive reflux esophagitis and small sliding hiatal hernia without evidence of ring or stricture. Antral gastritis with two small scars and two healing ulcers. Antral biopsy taken for CLOtest. Esophagus dilated by passing 56 [place] Maloney dilator to full insertion but no mucosal disruption induced.  Comment; Since patient does not have Schatzki's ring or stricture we may be dealing with esophageal motility disorder. If she does not respond to esophageal dilation will consider further evaluation.  Recommendations:  Continue anti-reflux measures. Pantoprazole 40 mg by mouth every morning. I would be contacting patient with CLOtest results.   Paz Fuentes U  08/22/2014  3:40 PM  CC: Dr. Monico Blitz, MD & Dr. Rayne Du ref. provider found CC  Dr.Ewian Redmond Pulling, MD

## 2014-08-23 LAB — CLOTEST (H. PYLORI), BIOPSY: Helicobacter screen: NEGATIVE — AB

## 2014-10-01 ENCOUNTER — Telehealth (INDEPENDENT_AMBULATORY_CARE_PROVIDER_SITE_OTHER): Payer: Self-pay | Admitting: Internal Medicine

## 2014-10-01 NOTE — Telephone Encounter (Signed)
Feels better. Swallowing is better.

## 2014-10-15 ENCOUNTER — Encounter: Payer: Self-pay | Admitting: Neurology

## 2014-10-15 ENCOUNTER — Ambulatory Visit (INDEPENDENT_AMBULATORY_CARE_PROVIDER_SITE_OTHER): Payer: Medicare Other | Admitting: Neurology

## 2014-10-15 VITALS — BP 162/85 | HR 64 | Ht 61.0 in | Wt 211.0 lb

## 2014-10-15 DIAGNOSIS — R200 Anesthesia of skin: Secondary | ICD-10-CM

## 2014-10-15 DIAGNOSIS — M4802 Spinal stenosis, cervical region: Secondary | ICD-10-CM

## 2014-10-15 DIAGNOSIS — R269 Unspecified abnormalities of gait and mobility: Secondary | ICD-10-CM

## 2014-10-15 DIAGNOSIS — N3941 Urge incontinence: Secondary | ICD-10-CM

## 2014-10-15 NOTE — Progress Notes (Signed)
PATIENT: Nicole Yoder DOB: 06-May-1936  REASON FOR VISIT: Follow up for gait abnormality, peripheral neuropathy, cervical stenosis  HISTORY FROM: patient  HISTORY OF PRESENT ILLNESS: evaluation of bilateral feet paresthesia, worsening gait difficulty, she is referred by her primary care physician Dr. Manuella Ghazi. She lives with her sister, drive here herself.  She had a past medical history of obesity, hypertension, hyperlipidemia, chronic bilateral shoulder pain, limited range of motion of bilateral shoulder  In 2009, she presented with bilateral feet paresthesia numbness tingling of her feet, she was seen by local neurologists, reported abnormal nerve conduction EMG, consistent with a sensory predominant peripheral neuropathy, there was no evidence of diabetes, no etiology found,  Over the past 5 years, her feet paresthesia has gradually getting worse, now involving her whole feet, numbness tingling burning pain, getting worse after standing for 20-30 minutes, or walking.  She also has low back pain, but denies shooting pain to bilateral lower extremity, past one year, she also complains of bilateral hand swelling, joints pain, difficulty to make a tight grip, bilateral fingertips paresthesia,  She has urinary urgency, nocturnal frequent urination, which has improved after taking VESIcare, is no longer taking that,  She complains of neck pain, cracking sounds in her neck when she turns around, reported abnormal MRI of cervical more than 2 years ago, I do not have the report   MRI cervical June 2014, C5-6: uncovertebral joint hypertrophy and facet hypertrophy with moderate spinal stenosis and severe biforaminal foraminal stenosis. C4-5, C6-7: uncovertebral joint hypertrophy and facet hypertrophy with mild-moderate spinal stenosis and severe biforaminal foraminal stenosis. C3-4: uncovertebral joint hypertrophy and facet hypertrophy with mild spinal stenosis and severe biforaminal foraminal  stenosis  She continued to have mild gait difficulty, left foot hurts.    UPDATE Jan 26th 2016:  She complains of bilateral feet swelling, heavy, occasionally left foot pulsating achy pain, she denies significant low back pain, mild urinary urgency,   REVIEW OF SYSTEMS: Full 14 system review of systems performed and notable only for: As above    ALLERGIES: Allergies  Allergen Reactions  . Prednisone Other (See Comments)    High blood pressure   . Celebrex [Celecoxib]   . Codeine   . Ivp Dye [Iodinated Diagnostic Agents]   . Lyrica [Pregabalin]   . Neurontin [Gabapentin]   . Penicillins   . Statins   . Sulfa Antibiotics   . Tramadol     HOME MEDICATIONS: Outpatient Prescriptions Prior to Visit  Medication Sig Dispense Refill  . acetaminophen (TYLENOL) 500 MG tablet Take 650 mg by mouth 2 (two) times daily.    Marland Kitchen atenolol (TENORMIN) 25 MG tablet Take 25 mg by mouth daily.    . Cholecalciferol (VITAMIN D PO) Take 1,000 Units by mouth daily.     . Cyanocobalamin (VITAMIN B 12) 100 MCG LOZG Take 1,000 mcg by mouth daily.    . diclofenac sodium (VOLTAREN) 1 % GEL Apply topically as directed.    Marland Kitchen levothyroxine (SYNTHROID, LEVOTHROID) 75 MCG tablet Take 75 mcg by mouth daily before breakfast.    . sertraline (ZOLOFT) 100 MG tablet Take 100 mg by mouth daily.    . pantoprazole (PROTONIX) 40 MG tablet Take 1 tablet (40 mg total) by mouth daily before breakfast. 30 tablet 5   No facility-administered medications prior to visit.    PHYSICAL EXAM Filed Vitals:   10/15/14 1044  BP: 162/85  Pulse: 64  Height: 5\' 1"  (1.549 m)  Weight: 211 lb (95.709  kg)   Body mass index is 39.89 kg/(m^2).  Generalized: Well developed, morbidly obese female in no acute distress  Head: normocephalic and atraumatic,. mallopatti 4  Neck: Decreased range of motion, no carotid bruits , neck size 17.  Cardiac: Regular rate rhythm, no murmur  Musculoskeletal: No deformity   Neurological examination   Mentation: Alert oriented to time, place, history taking. Follows all commands speech and language fluent.  Cranial nerve II-XII: Pupils were equal round reactive to light extraocular movements were full, visual field were full on confrontational test. Facial sensation and strength were normal. hearing was intact to finger rubbing bilaterally. Uvula tongue midline. Tongue protrusion into cheek strength was normal.  Motor: Limited range of motion of bilateral shoulder, she has bilateral foot swelling, left worse, mild bilateral toe extension weakness, left worse, Sensory: decreased fine touch and pinprick to distal shin. Absent vibratory to ankles and preserved proprioception.  Coordination: finger-nose-finger, heel-to-shin bilaterally, no dysmetria  Reflexes: Brachioradialis 3/3, biceps 3/3, triceps 2/2, patellar 2/2, Achilles trace plantar responses were flexor bilaterally.  Gait and Station: Rising up from seated position without assistance, wide based stance, cautious unsteady gait, Romberg negative   ASSESSMENT: 79 y.o. year old female has a past medical history of High blood pressure; High cholesterol; and obesity, bilateral feet paresthesias which have worsened over 5 years with length dependent sensory changes, hyperreflexia, gait abnormality, mild bilateral toe extension weakness, left worse than right,  Differentiation diagnosis of her gait difficulty including lumbar stenosis, aging, deconditioning, obesity, MRI of lumbar Return to clinic in 1 month  Orders Placed This Encounter  Procedures  . MR Lumbar Spine Wo Contrast    Return in about 1 month (around 11/15/2014).    Marcial Pacas, M.D. Ph.D.  [facility] [place], [place] [address] Phone: [phone] Fax:      [phone]

## 2014-10-22 DIAGNOSIS — R269 Unspecified abnormalities of gait and mobility: Secondary | ICD-10-CM

## 2014-10-25 ENCOUNTER — Other Ambulatory Visit: Payer: Self-pay | Admitting: Diagnostic Neuroimaging

## 2014-10-25 ENCOUNTER — Telehealth: Payer: Self-pay | Admitting: Neurology

## 2014-10-25 DIAGNOSIS — R200 Anesthesia of skin: Secondary | ICD-10-CM

## 2014-10-25 DIAGNOSIS — M4802 Spinal stenosis, cervical region: Secondary | ICD-10-CM

## 2014-10-25 DIAGNOSIS — R269 Unspecified abnormalities of gait and mobility: Secondary | ICD-10-CM

## 2014-10-25 DIAGNOSIS — N3941 Urge incontinence: Secondary | ICD-10-CM

## 2014-10-25 NOTE — Telephone Encounter (Signed)
Spoke to Bland - she is aware of her MRI results and expressed understanding.

## 2014-10-25 NOTE — Telephone Encounter (Signed)
Michelle: Please call patient, MRI lumbar showed degenerative disease, at different levels, most severe at L5-S1,L4-5 I will review MRI films at her follow up visit, no change at treatment plan now.    MRI lumbar spine (without) demonstrating: 1. At L3-4: disc bulging and facet hypertrophy with mild spinal stenosis and mild biforaminal stenosis  2. At L4-5: disc bulging and facet hypertrophy with mild spinal stenosis and mild right and moderate left foraminal stenosis  3. At L5-S1: disc bulging and facet hypertrophy with severe right and moderate left foraminal stenosis  4. Degenerative spondylosis and disc disease from L2-3 to L5-S1.

## 2014-11-18 ENCOUNTER — Ambulatory Visit: Payer: Medicare Other | Admitting: Neurology

## 2014-11-21 ENCOUNTER — Encounter: Payer: Self-pay | Admitting: Neurology

## 2014-11-21 ENCOUNTER — Ambulatory Visit (INDEPENDENT_AMBULATORY_CARE_PROVIDER_SITE_OTHER): Payer: Medicare Other | Admitting: Neurology

## 2014-11-21 VITALS — BP 154/80 | HR 68 | Ht 61.0 in | Wt 206.0 lb

## 2014-11-21 DIAGNOSIS — M4802 Spinal stenosis, cervical region: Secondary | ICD-10-CM

## 2014-11-21 DIAGNOSIS — R200 Anesthesia of skin: Secondary | ICD-10-CM

## 2014-11-21 DIAGNOSIS — M4806 Spinal stenosis, lumbar region: Secondary | ICD-10-CM | POA: Diagnosis not present

## 2014-11-21 DIAGNOSIS — R269 Unspecified abnormalities of gait and mobility: Secondary | ICD-10-CM | POA: Diagnosis not present

## 2014-11-21 DIAGNOSIS — M48061 Spinal stenosis, lumbar region without neurogenic claudication: Secondary | ICD-10-CM

## 2014-11-21 NOTE — Progress Notes (Signed)
PATIENT: Nicole Yoder DOB: 1936-03-15  REASON FOR VISIT: Follow up for gait abnormality, peripheral neuropathy, cervical stenosis  HISTORY FROM: patient  HISTORY OF PRESENT ILLNESS: evaluation of bilateral feet paresthesia, worsening gait difficulty, she is referred by her primary care physician Dr. Manuella Ghazi. She lives with her sister, drive here herself.  She had a past medical history of obesity, hypertension, hyperlipidemia, chronic bilateral shoulder pain, limited range of motion of bilateral shoulder   In 2009, she presented with bilateral feet paresthesia numbness tingling of her feet, she was seen by local neurologists, reported abnormal nerve conduction EMG, consistent with a sensory predominant peripheral neuropathy, there was no evidence of diabetes, no etiology found,  Over the past 5 years, her feet paresthesia has gradually getting worse, now involving her whole feet, numbness tingling burning pain, getting worse after standing for 20-30 minutes, or walking.  She also has low back pain, but denies shooting pain to bilateral lower extremity, past one year, she also complains of bilateral hand swelling, joints pain, difficulty to make a tight grip, bilateral fingertips paresthesia,  She has urinary urgency, nocturnal frequent urination, which has improved after taking VESIcare, is no longer taking that,  She complains of neck pain, cracking sounds in her neck when she turns around, reported abnormal MRI of cervical more than 2 years ago, I do not have the report   MRI cervical June 2014, C5-6: uncovertebral joint hypertrophy and facet hypertrophy with moderate spinal stenosis and severe biforaminal foraminal stenosis. C4-5, C6-7: uncovertebral joint hypertrophy and facet hypertrophy with mild-moderate spinal stenosis and severe biforaminal foraminal stenosis. C3-4: uncovertebral joint hypertrophy and facet hypertrophy with mild spinal stenosis and severe biforaminal foraminal  stenosis  She continued to have mild gait difficulty, left foot hurts.    UPDATE Jan 26th 2016:  She complains of bilateral feet swelling, heavy, occasionally left foot pulsating achy pain, she denies significant low back pain, mild urinary urgency,   UPDATE November 21 2014: MRI lumbar Feb 2016:At L3-4: disc bulging and facet hypertrophy with mild spinal stenosis and mild biforaminal stenosis, L4-5: disc bulging and facet hypertrophy with mild spinal stenosis and mild right and moderate left foraminal stenosis  L5-S1: disc bulging and facet hypertrophy with severe right and moderate left foraminal stenosis. Degenerative spondylosis and disc disease from L2-3 to L5-S1.     REVIEW OF SYSTEMS: Full 14 system review of systems performed and notable only for: As above    ALLERGIES: Allergies  Allergen Reactions  . Prednisone Other (See Comments)    High blood pressure   . Celebrex [Celecoxib]   . Codeine   . Ivp Dye [Iodinated Diagnostic Agents]   . Lyrica [Pregabalin]   . Neurontin [Gabapentin]   . Penicillins   . Statins   . Sulfa Antibiotics   . Tramadol     HOME MEDICATIONS: Outpatient Prescriptions Prior to Visit  Medication Sig Dispense Refill  . acetaminophen (TYLENOL) 500 MG tablet Take 650 mg by mouth 2 (two) times daily.    Marland Kitchen atenolol (TENORMIN) 25 MG tablet Take 25 mg by mouth daily.    . Cholecalciferol (VITAMIN D PO) Take 1,000 Units by mouth daily.     . Cyanocobalamin (VITAMIN B 12) 100 MCG LOZG Take 1,000 mcg by mouth daily.    . diclofenac sodium (VOLTAREN) 1 % GEL Apply topically as directed.    Marland Kitchen levothyroxine (SYNTHROID, LEVOTHROID) 75 MCG tablet Take 75 mcg by mouth daily before breakfast.    . mirabegron  ER (MYRBETRIQ) 25 MG TB24 tablet Take 25 mg by mouth daily.    . sertraline (ZOLOFT) 100 MG tablet Take 100 mg by mouth daily.     No facility-administered medications prior to visit.    PHYSICAL EXAM Filed Vitals:   11/21/14 1025  BP: 154/80  Pulse:  68  Height: 5\' 1"  (1.549 m)  Weight: 206 lb (93.441 kg)   Body mass index is 38.94 kg/(m^2).  PHYSICAL EXAMNIATION:  Gen: NAD, conversant, well nourised, obese, well groomed                     Cardiovascular: Regular rate rhythm, no peripheral edema, warm, nontender. Eyes: Conjunctivae clear without exudates or hemorrhage Neck: Supple, no carotid bruise. Pulmonary: Clear to auscultation bilaterally   NEUROLOGICAL EXAM:  MENTAL STATUS: Speech:    Speech is normal; fluent and spontaneous with normal comprehension.  Cognition:    The patient is oriented to person, place, and time;     recent and remote memory intact;     language fluent;     normal attention, concentration,     fund of knowledge.  CRANIAL NERVES: CN II: Visual fields are full to confrontation. Fundoscopic exam is normal with sharp discs and no vascular changes. Venous pulsations are present bilaterally. Pupils are 4 mm and briskly reactive to light. Visual acuity is 20/20 bilaterally. CN III, IV, VI: extraocular movement are normal. No ptosis. CN V: Facial sensation is intact to pinprick in all 3 divisions bilaterally. Corneal responses are intact.  CN VII: Face is symmetric with normal eye closure and smile. CN VIII: Hearing is normal to rubbing fingers CN IX, X: Palate elevates symmetrically. Phonation is normal. CN XI: Head turning and shoulder shrug are intact CN XII: Tongue is midline with normal movements and no atrophy.  MOTOR: There is no pronator drift of out-stretched arms. Muscle bulk and tone are normal. Muscle strength is normal.   Shoulder abduction Shoulder external rotation Elbow flexion Elbow extension Wrist flexion Wrist extension Finger abduction Hip flexion Knee flexion Knee extension Ankle dorsi flexion Ankle plantar flexion  R 5 5 5 5 5 5 5 5 5 5 5 5   L 5 5 5 5 5 5 5 5 5 5 5 5     REFLEXES: Reflexes are 2+ and symmetric at the biceps, triceps, knees, and ankles. Plantar responses are  flexor.  SENSORY: Light touch, pinprick, position sense, and vibration sense are intact in fingers and toes.  COORDINATION: Rapid alternating movements and fine finger movements are intact. There is no dysmetria on finger-to-nose and heel-knee-shin. There are no abnormal or extraneous movements.   GAIT/STANCE: Posture is normal. Gait is steady with normal steps, base, arm swing, and turning. Heel and toe walking are normal. Tandem gait is normal.  Romberg is absent.     ASSESSMENT: 79 y.o. year old female has a past medical history of High blood pressure; High cholesterol; and obesity, bilateral feet paresthesias which have worsened since 2011 with length dependent sensory changes, hyperreflexia, gait abnormality, mild bilateral toe extension weakness, left worse than right,  Differentiation diagnosis of her gait difficulty including lumbar stenosis, aging, deconditioning, obesity, MRI of lumbar Return to clinic in 1 month     Marcial Pacas, M.D. Ph.D.  [facility] [place], [place] [address] Phone: [phone] Fax:      [phone]

## 2014-11-26 ENCOUNTER — Telehealth: Payer: Self-pay | Admitting: Neurology

## 2014-11-26 NOTE — Telephone Encounter (Signed)
Tammy from [employer] is calling stating she contacted pt and pt states she does not want to try PT at this time.  Just wanted to inform you of this information.  No call back is needed.

## 2015-05-27 ENCOUNTER — Ambulatory Visit: Payer: Medicare Other | Admitting: Nurse Practitioner

## 2015-09-30 DIAGNOSIS — H40053 Ocular hypertension, bilateral: Secondary | ICD-10-CM | POA: Diagnosis not present

## 2015-11-18 DIAGNOSIS — M542 Cervicalgia: Secondary | ICD-10-CM | POA: Diagnosis not present

## 2015-11-18 DIAGNOSIS — I10 Essential (primary) hypertension: Secondary | ICD-10-CM | POA: Diagnosis not present

## 2015-11-18 DIAGNOSIS — M129 Arthropathy, unspecified: Secondary | ICD-10-CM | POA: Diagnosis not present

## 2015-11-18 DIAGNOSIS — Z789 Other specified health status: Secondary | ICD-10-CM | POA: Diagnosis not present

## 2016-01-22 ENCOUNTER — Ambulatory Visit (INDEPENDENT_AMBULATORY_CARE_PROVIDER_SITE_OTHER): Payer: Medicare Other | Admitting: Orthopaedic Surgery

## 2016-01-22 ENCOUNTER — Encounter: Payer: Self-pay | Admitting: Orthopaedic Surgery

## 2016-01-22 VITALS — BP 170/73 | HR 63 | Temp 97.3°F | Ht 61.0 in | Wt 206.0 lb

## 2016-01-22 DIAGNOSIS — M65332 Trigger finger, left middle finger: Secondary | ICD-10-CM | POA: Diagnosis not present

## 2016-01-22 DIAGNOSIS — M79641 Pain in right hand: Secondary | ICD-10-CM | POA: Diagnosis not present

## 2016-01-22 DIAGNOSIS — M79642 Pain in left hand: Secondary | ICD-10-CM | POA: Diagnosis not present

## 2016-01-22 NOTE — Progress Notes (Signed)
Patient Nicole Yoder:[id number] Greg Cutter, female DOB:Nov 04, 1935, 80 y.o. EZ:[id number]  Chief Complaint  Patient presents with  . Hand Pain    Bilateral hand pain , trigger finger on the left     HPI  Nicole Yoder is a 80 y.o. female who has bilateral hand pain, more in the early morning.  She gets less pain and gets better as the morning progresses and after she uses her hands.  She has some swelling.  She has no numbness.   She has been using the Voltaren Gel.  She started having triggering of the left long finger several weeks ago.  It triggers in the morning but not later in the day.  It hurts.  She has no numbness, no redness. She has no trauma.  She has tired heat, ice, rest with no help.  HPI  Body mass index is 38.94 kg/(m^2).Nicole Yoder  Review of Systems  HENT: Negative for congestion.   Respiratory: Negative for cough and shortness of breath.   Cardiovascular: Negative for chest pain and leg swelling.  Endocrine: Positive for cold intolerance.  Musculoskeletal: Positive for myalgias, back pain, joint swelling and arthralgias.  Allergic/Immunologic: Positive for environmental allergies.    Past Medical History  Diagnosis Date  . High blood pressure   . High cholesterol   . Anxiety   . Neuropathy     Past Surgical History  Procedure Laterality Date  . Eye surgery    . Tonsillectomy and adenoidectomy    . Carpal tunnel release Bilateral   . Ganglion cyst excision    . Broken wrist    . Broken ribs      Family History  Problem Relation Age of Onset  . Lung cancer Father   . Heart attack Mother     Social History Social History  Substance Use Topics  . Smoking status: Never Smoker   . Smokeless tobacco: Never Used  . Alcohol Use: No    Allergies  Allergen Reactions  . Prednisone Other (See Comments)    High blood pressure   . Celebrex [Celecoxib]   . Codeine   . Ivp Dye [Iodinated Diagnostic Agents]   . Lyrica [Pregabalin]   . Neurontin [Gabapentin]   .  Penicillins   . Statins   . Sulfa Antibiotics   . Tramadol     Current Outpatient Prescriptions  Medication Sig Dispense Refill  . acetaminophen (TYLENOL) 500 MG tablet Take 650 mg by mouth 2 (two) times daily.    Nicole Yoder atenolol (TENORMIN) 25 MG tablet Take 25 mg by mouth daily.    . Cholecalciferol (VITAMIN D PO) Take 1,000 Units by mouth daily.     . Cyanocobalamin (VITAMIN B 12) 100 MCG LOZG Take 1,000 mcg by mouth daily.    . diclofenac sodium (VOLTAREN) 1 % GEL Apply topically as directed.    Nicole Yoder levothyroxine (SYNTHROID, LEVOTHROID) 75 MCG tablet Take 75 mcg by mouth daily before breakfast.    . meloxicam (MOBIC) 15 MG tablet Take 15 mg by mouth daily.    . sertraline (ZOLOFT) 100 MG tablet Take 100 mg by mouth daily.    . mirabegron ER (MYRBETRIQ) 25 MG TB24 tablet Take 25 mg by mouth daily. Reported on 01/22/2016     No current facility-administered medications for this visit.     Physical Exam  Blood pressure 170/73, pulse 63, temperature 97.3 F (36.3 C), height 5\' 1"  (1.549 m), weight 206 lb (93.441 kg).  Constitutional: overall normal hygiene, normal nutrition, well developed,  normal grooming, normal body habitus. Assistive device:none  Musculoskeletal: gait and station Limp none, muscle tone and strength are normal, no tremors or atrophy is present.  .  Neurological: coordination overall normal.  Deep tendon reflex/nerve stretch intact.  Sensation normal.  Cranial nerves II-XII intact.   Skin:   normal overall no scars, lesions, ulcers or rashes. No psoriasis.  Psychiatric: Alert and oriented x 3.  Recent memory intact, remote memory unclear.  Normal mood and affect. Well groomed.  Good eye contact.  Cardiovascular: overall no swelling, no varicosities, no edema bilaterally, normal temperatures of the legs and arms, no clubbing, cyanosis and good capillary refill.  Lymphatic: palpation is normal.  Right Hand Exam  Right hand exam is normal.  Tenderness  The patient  is experiencing tenderness in the dorsal area.  Range of Motion  The patient has normal right wrist ROM.   Muscle Strength  The patient has normal right wrist strength.  Tests  Phalen's Sign: negative Tinel's Sign (Medial Nerve): negative Finkelstein: negative  Other  Erythema: absent Scars: absent Sensation: normal Pulse: present   Left Hand Exam   Tenderness  The patient is experiencing tenderness in the dorsal area.   Range of Motion  The patient has normal left wrist ROM.  Muscle Strength  The patient has normal left wrist strength.  Tests  Phalen's Sign: negative Tinel's Sign (Medial Nerve): negative Finkelstein: negative  Other  Erythema: absent Scars: absent Sensation: normal Pulse: present  Comments:  She has some slight triggering of the left long finger at the A1 pulley.  She has no redness, no swelling, no wound.  NV is intact.     The patient has been educated about the nature of the problem(s) and counseled on treatment options.  The patient appeared to understand what I have discussed and is in agreement with it.  Encounter Diagnoses  Name Primary?  . Trigger middle finger of left hand Yes  . Bilateral hand pain    PROCEDURE NOTE  The left hand long finger was examined.  She has triggering at the A1 pulley on the palm side.  I explained about injection in this area.  She agrees.  The area was prepped.  I injected 1% Xylocaine and 1 cc of DepoMedrol 40 into the A1 pulley area by sterile technique tolerated well.  A band-aid dressing was applied.  She is to use ice as need and move finger often tonight.  PLAN Call if any problems.  Precautions discussed.  Continue current medications.   Return to clinic 1 month

## 2016-01-29 DIAGNOSIS — J449 Chronic obstructive pulmonary disease, unspecified: Secondary | ICD-10-CM | POA: Diagnosis not present

## 2016-01-29 DIAGNOSIS — M159 Polyosteoarthritis, unspecified: Secondary | ICD-10-CM | POA: Diagnosis not present

## 2016-01-29 DIAGNOSIS — I10 Essential (primary) hypertension: Secondary | ICD-10-CM | POA: Diagnosis not present

## 2016-02-05 ENCOUNTER — Ambulatory Visit: Payer: Medicare Other | Admitting: Orthopaedic Surgery

## 2016-02-10 DIAGNOSIS — J449 Chronic obstructive pulmonary disease, unspecified: Secondary | ICD-10-CM | POA: Diagnosis not present

## 2016-02-10 DIAGNOSIS — Z6838 Body mass index (BMI) 38.0-38.9, adult: Secondary | ICD-10-CM | POA: Diagnosis not present

## 2016-02-10 DIAGNOSIS — J069 Acute upper respiratory infection, unspecified: Secondary | ICD-10-CM | POA: Diagnosis not present

## 2016-02-10 DIAGNOSIS — E7800 Pure hypercholesterolemia, unspecified: Secondary | ICD-10-CM | POA: Diagnosis not present

## 2016-02-10 DIAGNOSIS — Z789 Other specified health status: Secondary | ICD-10-CM | POA: Diagnosis not present

## 2016-02-10 DIAGNOSIS — I10 Essential (primary) hypertension: Secondary | ICD-10-CM | POA: Diagnosis not present

## 2016-02-24 DIAGNOSIS — E039 Hypothyroidism, unspecified: Secondary | ICD-10-CM | POA: Diagnosis not present

## 2016-02-24 DIAGNOSIS — Z299 Encounter for prophylactic measures, unspecified: Secondary | ICD-10-CM | POA: Diagnosis not present

## 2016-02-24 DIAGNOSIS — E559 Vitamin D deficiency, unspecified: Secondary | ICD-10-CM | POA: Diagnosis not present

## 2016-02-24 DIAGNOSIS — Z0000 Encounter for general adult medical examination without abnormal findings: Secondary | ICD-10-CM | POA: Diagnosis not present

## 2016-02-24 DIAGNOSIS — Z1389 Encounter for screening for other disorder: Secondary | ICD-10-CM | POA: Diagnosis not present

## 2016-02-24 DIAGNOSIS — Z1211 Encounter for screening for malignant neoplasm of colon: Secondary | ICD-10-CM | POA: Diagnosis not present

## 2016-02-24 DIAGNOSIS — Z7189 Other specified counseling: Secondary | ICD-10-CM | POA: Diagnosis not present

## 2016-02-24 DIAGNOSIS — M7551 Bursitis of right shoulder: Secondary | ICD-10-CM | POA: Diagnosis not present

## 2016-02-24 DIAGNOSIS — R5383 Other fatigue: Secondary | ICD-10-CM | POA: Diagnosis not present

## 2016-02-24 DIAGNOSIS — E7800 Pure hypercholesterolemia, unspecified: Secondary | ICD-10-CM | POA: Diagnosis not present

## 2016-02-24 DIAGNOSIS — R05 Cough: Secondary | ICD-10-CM | POA: Diagnosis not present

## 2016-02-24 DIAGNOSIS — Z6839 Body mass index (BMI) 39.0-39.9, adult: Secondary | ICD-10-CM | POA: Diagnosis not present

## 2016-02-24 DIAGNOSIS — Z79899 Other long term (current) drug therapy: Secondary | ICD-10-CM | POA: Diagnosis not present

## 2016-03-03 DIAGNOSIS — M792 Neuralgia and neuritis, unspecified: Secondary | ICD-10-CM | POA: Diagnosis not present

## 2016-03-03 DIAGNOSIS — G600 Hereditary motor and sensory neuropathy: Secondary | ICD-10-CM | POA: Diagnosis not present

## 2016-03-03 DIAGNOSIS — E1140 Type 2 diabetes mellitus with diabetic neuropathy, unspecified: Secondary | ICD-10-CM | POA: Diagnosis not present

## 2016-03-03 DIAGNOSIS — I739 Peripheral vascular disease, unspecified: Secondary | ICD-10-CM | POA: Diagnosis not present

## 2016-03-09 DIAGNOSIS — G600 Hereditary motor and sensory neuropathy: Secondary | ICD-10-CM | POA: Diagnosis not present

## 2016-03-09 DIAGNOSIS — M792 Neuralgia and neuritis, unspecified: Secondary | ICD-10-CM | POA: Diagnosis not present

## 2016-03-09 DIAGNOSIS — E1140 Type 2 diabetes mellitus with diabetic neuropathy, unspecified: Secondary | ICD-10-CM | POA: Diagnosis not present

## 2016-03-09 DIAGNOSIS — I739 Peripheral vascular disease, unspecified: Secondary | ICD-10-CM | POA: Diagnosis not present

## 2016-03-11 DIAGNOSIS — E1140 Type 2 diabetes mellitus with diabetic neuropathy, unspecified: Secondary | ICD-10-CM | POA: Diagnosis not present

## 2016-03-11 DIAGNOSIS — I739 Peripheral vascular disease, unspecified: Secondary | ICD-10-CM | POA: Diagnosis not present

## 2016-03-11 DIAGNOSIS — G600 Hereditary motor and sensory neuropathy: Secondary | ICD-10-CM | POA: Diagnosis not present

## 2016-03-11 DIAGNOSIS — M792 Neuralgia and neuritis, unspecified: Secondary | ICD-10-CM | POA: Diagnosis not present

## 2016-03-16 DIAGNOSIS — E1140 Type 2 diabetes mellitus with diabetic neuropathy, unspecified: Secondary | ICD-10-CM | POA: Diagnosis not present

## 2016-03-16 DIAGNOSIS — M792 Neuralgia and neuritis, unspecified: Secondary | ICD-10-CM | POA: Diagnosis not present

## 2016-03-16 DIAGNOSIS — I739 Peripheral vascular disease, unspecified: Secondary | ICD-10-CM | POA: Diagnosis not present

## 2016-03-16 DIAGNOSIS — G600 Hereditary motor and sensory neuropathy: Secondary | ICD-10-CM | POA: Diagnosis not present

## 2016-03-30 ENCOUNTER — Telehealth: Payer: Self-pay | Admitting: Orthopaedic Surgery

## 2016-03-30 DIAGNOSIS — E1140 Type 2 diabetes mellitus with diabetic neuropathy, unspecified: Secondary | ICD-10-CM | POA: Diagnosis not present

## 2016-03-30 DIAGNOSIS — M792 Neuralgia and neuritis, unspecified: Secondary | ICD-10-CM | POA: Diagnosis not present

## 2016-03-30 DIAGNOSIS — G600 Hereditary motor and sensory neuropathy: Secondary | ICD-10-CM | POA: Diagnosis not present

## 2016-03-30 DIAGNOSIS — I739 Peripheral vascular disease, unspecified: Secondary | ICD-10-CM | POA: Diagnosis not present

## 2016-03-30 NOTE — Telephone Encounter (Signed)
Already faxed it to drug store.

## 2016-03-30 NOTE — Telephone Encounter (Signed)
Patient requests refill of: Diclofenac (Voltaren) gel 1% per [facility] faxed request. Pharmacy [phone]

## 2016-03-31 NOTE — Telephone Encounter (Signed)
Contacted patient; notified.

## 2016-04-01 DIAGNOSIS — E1140 Type 2 diabetes mellitus with diabetic neuropathy, unspecified: Secondary | ICD-10-CM | POA: Diagnosis not present

## 2016-04-01 DIAGNOSIS — M792 Neuralgia and neuritis, unspecified: Secondary | ICD-10-CM | POA: Diagnosis not present

## 2016-04-01 DIAGNOSIS — I739 Peripheral vascular disease, unspecified: Secondary | ICD-10-CM | POA: Diagnosis not present

## 2016-04-01 DIAGNOSIS — G600 Hereditary motor and sensory neuropathy: Secondary | ICD-10-CM | POA: Diagnosis not present

## 2016-04-06 DIAGNOSIS — I739 Peripheral vascular disease, unspecified: Secondary | ICD-10-CM | POA: Diagnosis not present

## 2016-04-06 DIAGNOSIS — M792 Neuralgia and neuritis, unspecified: Secondary | ICD-10-CM | POA: Diagnosis not present

## 2016-04-06 DIAGNOSIS — G600 Hereditary motor and sensory neuropathy: Secondary | ICD-10-CM | POA: Diagnosis not present

## 2016-04-06 DIAGNOSIS — E1140 Type 2 diabetes mellitus with diabetic neuropathy, unspecified: Secondary | ICD-10-CM | POA: Diagnosis not present

## 2016-04-08 DIAGNOSIS — M792 Neuralgia and neuritis, unspecified: Secondary | ICD-10-CM | POA: Diagnosis not present

## 2016-04-08 DIAGNOSIS — G600 Hereditary motor and sensory neuropathy: Secondary | ICD-10-CM | POA: Diagnosis not present

## 2016-04-08 DIAGNOSIS — E1140 Type 2 diabetes mellitus with diabetic neuropathy, unspecified: Secondary | ICD-10-CM | POA: Diagnosis not present

## 2016-04-08 DIAGNOSIS — I739 Peripheral vascular disease, unspecified: Secondary | ICD-10-CM | POA: Diagnosis not present

## 2016-04-13 DIAGNOSIS — I739 Peripheral vascular disease, unspecified: Secondary | ICD-10-CM | POA: Diagnosis not present

## 2016-04-13 DIAGNOSIS — M792 Neuralgia and neuritis, unspecified: Secondary | ICD-10-CM | POA: Diagnosis not present

## 2016-04-13 DIAGNOSIS — E1140 Type 2 diabetes mellitus with diabetic neuropathy, unspecified: Secondary | ICD-10-CM | POA: Diagnosis not present

## 2016-04-13 DIAGNOSIS — G600 Hereditary motor and sensory neuropathy: Secondary | ICD-10-CM | POA: Diagnosis not present

## 2016-04-15 DIAGNOSIS — M792 Neuralgia and neuritis, unspecified: Secondary | ICD-10-CM | POA: Diagnosis not present

## 2016-04-15 DIAGNOSIS — I739 Peripheral vascular disease, unspecified: Secondary | ICD-10-CM | POA: Diagnosis not present

## 2016-04-15 DIAGNOSIS — E1140 Type 2 diabetes mellitus with diabetic neuropathy, unspecified: Secondary | ICD-10-CM | POA: Diagnosis not present

## 2016-04-15 DIAGNOSIS — G600 Hereditary motor and sensory neuropathy: Secondary | ICD-10-CM | POA: Diagnosis not present

## 2016-04-20 DIAGNOSIS — E1140 Type 2 diabetes mellitus with diabetic neuropathy, unspecified: Secondary | ICD-10-CM | POA: Diagnosis not present

## 2016-04-20 DIAGNOSIS — I739 Peripheral vascular disease, unspecified: Secondary | ICD-10-CM | POA: Diagnosis not present

## 2016-04-20 DIAGNOSIS — M792 Neuralgia and neuritis, unspecified: Secondary | ICD-10-CM | POA: Diagnosis not present

## 2016-04-20 DIAGNOSIS — G600 Hereditary motor and sensory neuropathy: Secondary | ICD-10-CM | POA: Diagnosis not present

## 2016-04-22 DIAGNOSIS — E1140 Type 2 diabetes mellitus with diabetic neuropathy, unspecified: Secondary | ICD-10-CM | POA: Diagnosis not present

## 2016-04-22 DIAGNOSIS — I739 Peripheral vascular disease, unspecified: Secondary | ICD-10-CM | POA: Diagnosis not present

## 2016-04-22 DIAGNOSIS — M792 Neuralgia and neuritis, unspecified: Secondary | ICD-10-CM | POA: Diagnosis not present

## 2016-04-22 DIAGNOSIS — G600 Hereditary motor and sensory neuropathy: Secondary | ICD-10-CM | POA: Diagnosis not present

## 2016-04-27 DIAGNOSIS — M792 Neuralgia and neuritis, unspecified: Secondary | ICD-10-CM | POA: Diagnosis not present

## 2016-04-27 DIAGNOSIS — E1140 Type 2 diabetes mellitus with diabetic neuropathy, unspecified: Secondary | ICD-10-CM | POA: Diagnosis not present

## 2016-04-27 DIAGNOSIS — I739 Peripheral vascular disease, unspecified: Secondary | ICD-10-CM | POA: Diagnosis not present

## 2016-04-27 DIAGNOSIS — G600 Hereditary motor and sensory neuropathy: Secondary | ICD-10-CM | POA: Diagnosis not present

## 2016-04-30 DIAGNOSIS — I10 Essential (primary) hypertension: Secondary | ICD-10-CM | POA: Diagnosis not present

## 2016-04-30 DIAGNOSIS — M159 Polyosteoarthritis, unspecified: Secondary | ICD-10-CM | POA: Diagnosis not present

## 2016-04-30 DIAGNOSIS — J449 Chronic obstructive pulmonary disease, unspecified: Secondary | ICD-10-CM | POA: Diagnosis not present

## 2016-05-04 DIAGNOSIS — E1140 Type 2 diabetes mellitus with diabetic neuropathy, unspecified: Secondary | ICD-10-CM | POA: Diagnosis not present

## 2016-05-04 DIAGNOSIS — G600 Hereditary motor and sensory neuropathy: Secondary | ICD-10-CM | POA: Diagnosis not present

## 2016-05-04 DIAGNOSIS — M792 Neuralgia and neuritis, unspecified: Secondary | ICD-10-CM | POA: Diagnosis not present

## 2016-05-04 DIAGNOSIS — I739 Peripheral vascular disease, unspecified: Secondary | ICD-10-CM | POA: Diagnosis not present

## 2016-05-06 DIAGNOSIS — G600 Hereditary motor and sensory neuropathy: Secondary | ICD-10-CM | POA: Diagnosis not present

## 2016-05-06 DIAGNOSIS — E1140 Type 2 diabetes mellitus with diabetic neuropathy, unspecified: Secondary | ICD-10-CM | POA: Diagnosis not present

## 2016-05-06 DIAGNOSIS — M792 Neuralgia and neuritis, unspecified: Secondary | ICD-10-CM | POA: Diagnosis not present

## 2016-05-06 DIAGNOSIS — I739 Peripheral vascular disease, unspecified: Secondary | ICD-10-CM | POA: Diagnosis not present

## 2016-05-28 DIAGNOSIS — J449 Chronic obstructive pulmonary disease, unspecified: Secondary | ICD-10-CM | POA: Diagnosis not present

## 2016-05-28 DIAGNOSIS — M542 Cervicalgia: Secondary | ICD-10-CM | POA: Diagnosis not present

## 2016-05-28 DIAGNOSIS — I10 Essential (primary) hypertension: Secondary | ICD-10-CM | POA: Diagnosis not present

## 2016-05-28 DIAGNOSIS — E7800 Pure hypercholesterolemia, unspecified: Secondary | ICD-10-CM | POA: Diagnosis not present

## 2016-06-03 DIAGNOSIS — I10 Essential (primary) hypertension: Secondary | ICD-10-CM | POA: Diagnosis not present

## 2016-06-03 DIAGNOSIS — M159 Polyosteoarthritis, unspecified: Secondary | ICD-10-CM | POA: Diagnosis not present

## 2016-06-03 DIAGNOSIS — J449 Chronic obstructive pulmonary disease, unspecified: Secondary | ICD-10-CM | POA: Diagnosis not present

## 2016-06-09 DIAGNOSIS — Z23 Encounter for immunization: Secondary | ICD-10-CM | POA: Diagnosis not present

## 2016-06-23 DIAGNOSIS — M159 Polyosteoarthritis, unspecified: Secondary | ICD-10-CM | POA: Diagnosis not present

## 2016-06-23 DIAGNOSIS — I10 Essential (primary) hypertension: Secondary | ICD-10-CM | POA: Diagnosis not present

## 2016-07-30 DIAGNOSIS — M159 Polyosteoarthritis, unspecified: Secondary | ICD-10-CM | POA: Diagnosis not present

## 2016-07-30 DIAGNOSIS — I10 Essential (primary) hypertension: Secondary | ICD-10-CM | POA: Diagnosis not present

## 2016-09-06 DIAGNOSIS — I10 Essential (primary) hypertension: Secondary | ICD-10-CM | POA: Diagnosis not present

## 2016-09-06 DIAGNOSIS — M159 Polyosteoarthritis, unspecified: Secondary | ICD-10-CM | POA: Diagnosis not present

## 2016-09-23 DIAGNOSIS — M159 Polyosteoarthritis, unspecified: Secondary | ICD-10-CM | POA: Diagnosis not present

## 2016-09-23 DIAGNOSIS — I10 Essential (primary) hypertension: Secondary | ICD-10-CM | POA: Diagnosis not present

## 2016-10-14 ENCOUNTER — Ambulatory Visit (INDEPENDENT_AMBULATORY_CARE_PROVIDER_SITE_OTHER): Payer: Medicare Other

## 2016-10-14 ENCOUNTER — Ambulatory Visit (INDEPENDENT_AMBULATORY_CARE_PROVIDER_SITE_OTHER): Payer: Medicare Other | Admitting: Orthopaedic Surgery

## 2016-10-14 ENCOUNTER — Encounter: Payer: Self-pay | Admitting: Orthopaedic Surgery

## 2016-10-14 VITALS — BP 156/81 | HR 67 | Temp 97.5°F | Ht 61.0 in | Wt 199.0 lb

## 2016-10-14 DIAGNOSIS — E1140 Type 2 diabetes mellitus with diabetic neuropathy, unspecified: Secondary | ICD-10-CM | POA: Diagnosis not present

## 2016-10-14 DIAGNOSIS — M25511 Pain in right shoulder: Secondary | ICD-10-CM | POA: Diagnosis not present

## 2016-10-14 DIAGNOSIS — M79641 Pain in right hand: Secondary | ICD-10-CM | POA: Diagnosis not present

## 2016-10-14 DIAGNOSIS — M79642 Pain in left hand: Secondary | ICD-10-CM

## 2016-10-14 DIAGNOSIS — G600 Hereditary motor and sensory neuropathy: Secondary | ICD-10-CM | POA: Diagnosis not present

## 2016-10-14 DIAGNOSIS — I739 Peripheral vascular disease, unspecified: Secondary | ICD-10-CM | POA: Diagnosis not present

## 2016-10-14 DIAGNOSIS — M792 Neuralgia and neuritis, unspecified: Secondary | ICD-10-CM | POA: Diagnosis not present

## 2016-10-14 NOTE — Patient Instructions (Signed)
Rx: given for lift chair

## 2016-10-14 NOTE — Progress Notes (Signed)
Patient [name], female DOB:24-Feb-1936, 81 y.o. AOZ:[id number]  Chief Complaint  Patient presents with  . Follow-up    bilateral shoulder pain    HPI  Nicole Yoder is a 81 y.o. female who has had chronic pain in both hands and in the shoulders.  Her hands are unchanged with more pain first thing in the mornings.  She has more pain in the shoulders, more on the right. She has pain with overhead use.  She has no numbness. She has no trauma.  She has no redness. She is taking her medicine and trying to be active. HPI  Body mass index is 37.6 kg/m.  ROS  Review of Systems  HENT: Negative for congestion.   Respiratory: Negative for cough and shortness of breath.   Cardiovascular: Negative for chest pain and leg swelling.  Endocrine: Positive for cold intolerance.  Musculoskeletal: Positive for arthralgias, back pain, joint swelling and myalgias.  Allergic/Immunologic: Positive for environmental allergies.    Past Medical History:  Diagnosis Date  . Anxiety   . High blood pressure   . High cholesterol   . Neuropathy [facility])     Past Surgical History:  Procedure Laterality Date  . broken ribs    . broken wrist    . CARPAL TUNNEL RELEASE Bilateral   . EYE SURGERY    . GANGLION CYST EXCISION    . TONSILLECTOMY AND ADENOIDECTOMY      Family History  Problem Relation Age of Onset  . Lung cancer Father   . Heart attack Mother     Social History Social History  Substance Use Topics  . Smoking status: Never Smoker  . Smokeless tobacco: Never Used  . Alcohol use No    Allergies  Allergen Reactions  . Prednisone Other (See Comments)    High blood pressure   . Celebrex [Celecoxib]   . Codeine   . Ivp Dye [Iodinated Diagnostic Agents]   . Lyrica [Pregabalin]   . Neurontin [Gabapentin]   . Penicillins   . Statins   . Sulfa Antibiotics   . Tramadol     Current Outpatient Prescriptions  Medication Sig Dispense Refill  . acetaminophen (TYLENOL)  500 MG tablet Take 650 mg by mouth 2 (two) times daily.    Marland Kitchen atenolol (TENORMIN) 25 MG tablet Take 25 mg by mouth daily.    . Cholecalciferol (VITAMIN D PO) Take 1,000 Units by mouth daily.     . Cyanocobalamin (VITAMIN B 12) 100 MCG LOZG Take 1,000 mcg by mouth daily.    . diclofenac sodium (VOLTAREN) 1 % GEL Apply topically as directed.    Marland Kitchen levothyroxine (SYNTHROID, LEVOTHROID) 75 MCG tablet Take 75 mcg by mouth daily before breakfast.    . meloxicam (MOBIC) 15 MG tablet Take 15 mg by mouth daily.    . mirabegron ER (MYRBETRIQ) 25 MG TB24 tablet Take 25 mg by mouth daily. Reported on 01/22/2016    . sertraline (ZOLOFT) 100 MG tablet Take 100 mg by mouth daily.     No current facility-administered medications for this visit.      Physical Exam  Blood pressure (!) 156/81, pulse 67, temperature 97.5 F (36.4 C), height 5\' 1"  (1.549 m), weight 199 lb (90.3 kg).  Constitutional: overall normal hygiene, normal nutrition, well developed, normal grooming, normal body habitus. Assistive device:none  Musculoskeletal: gait and station Limp none, muscle tone and strength are normal, no tremors or atrophy is present.  .  Neurological: coordination overall normal.  Deep tendon reflex/nerve stretch intact.  Sensation normal.  Cranial nerves II-XII intact.   Skin:   Normal overall no scars, lesions, ulcers or rashes. No psoriasis.  Psychiatric: Alert and oriented x 3.  Recent memory intact, remote memory unclear.  Normal mood and affect. Well groomed.  Good eye contact.  Cardiovascular: overall no swelling, no varicosities, no edema bilaterally, normal temperatures of the legs and arms, no clubbing, cyanosis and good capillary refill.  Lymphatic: palpation is normal.  Examination of right Upper Extremity is done.  Inspection:   Overall:  Elbow non-tender without crepitus or defects, forearm non-tender without crepitus or defects, wrist non-tender without crepitus or defects, hand  non-tender.    Shoulder: with glenohumeral joint tenderness, without effusion.   Upper arm: with swelling and tenderness   Range of motion:   Overall:  Full range of motion of the elbow, full range of motion of wrist and full range of motion in fingers.   Shoulder:  right  130 degrees forward flexion; 90 degrees abduction; 20 degrees internal rotation, 25 degrees external rotation, 10 degrees extension, 35 degrees adduction.   Stability:   Overall:  Shoulder, elbow and wrist stable   Strength and Tone:   Overall full shoulder muscles strength, full upper arm strength and normal upper arm bulk and tone.  Her hands have some MCP swelling, decreased grip from the arthritis pain. NV is intact.  X-rays were done of the right shoulder, reported separately.  The patient has been educated about the nature of the problem(s) and counseled on treatment options.  The patient appeared to understand what I have discussed and is in agreement with it.  Encounter Diagnoses  Name Primary?  . Pain in joint of right shoulder Yes  . Bilateral hand pain    PROCEDURE NOTE:  The patient request injection, verbal consent was obtained.  The right shoulder was prepped appropriately after time out was performed.   Sterile technique was observed and injection of 1 cc of Depo-Medrol 40 mg with several cc's of plain xylocaine. Anesthesia was provided by ethyl chloride and a 20-gauge needle was used to inject the shoulder area. A posterior approach was used.  The injection was tolerated well.  A band aid dressing was applied.  The patient was advised to apply ice later today and tomorrow to the injection sight as needed.    PLAN Call if any problems.  Precautions discussed.  Continue current medications.   Return to clinic 1 month   Electronically Signed Sanjuana Kava, MD 1/25/20182:38 PM

## 2016-10-25 DIAGNOSIS — E1140 Type 2 diabetes mellitus with diabetic neuropathy, unspecified: Secondary | ICD-10-CM | POA: Diagnosis not present

## 2016-10-25 DIAGNOSIS — M792 Neuralgia and neuritis, unspecified: Secondary | ICD-10-CM | POA: Diagnosis not present

## 2016-10-25 DIAGNOSIS — G600 Hereditary motor and sensory neuropathy: Secondary | ICD-10-CM | POA: Diagnosis not present

## 2016-10-25 DIAGNOSIS — I739 Peripheral vascular disease, unspecified: Secondary | ICD-10-CM | POA: Diagnosis not present

## 2016-10-27 DIAGNOSIS — G600 Hereditary motor and sensory neuropathy: Secondary | ICD-10-CM | POA: Diagnosis not present

## 2016-10-27 DIAGNOSIS — I739 Peripheral vascular disease, unspecified: Secondary | ICD-10-CM | POA: Diagnosis not present

## 2016-10-27 DIAGNOSIS — M792 Neuralgia and neuritis, unspecified: Secondary | ICD-10-CM | POA: Diagnosis not present

## 2016-10-27 DIAGNOSIS — E1140 Type 2 diabetes mellitus with diabetic neuropathy, unspecified: Secondary | ICD-10-CM | POA: Diagnosis not present

## 2016-11-01 DIAGNOSIS — M792 Neuralgia and neuritis, unspecified: Secondary | ICD-10-CM | POA: Diagnosis not present

## 2016-11-01 DIAGNOSIS — I739 Peripheral vascular disease, unspecified: Secondary | ICD-10-CM | POA: Diagnosis not present

## 2016-11-01 DIAGNOSIS — E1140 Type 2 diabetes mellitus with diabetic neuropathy, unspecified: Secondary | ICD-10-CM | POA: Diagnosis not present

## 2016-11-01 DIAGNOSIS — G600 Hereditary motor and sensory neuropathy: Secondary | ICD-10-CM | POA: Diagnosis not present

## 2016-11-03 DIAGNOSIS — M792 Neuralgia and neuritis, unspecified: Secondary | ICD-10-CM | POA: Diagnosis not present

## 2016-11-03 DIAGNOSIS — I739 Peripheral vascular disease, unspecified: Secondary | ICD-10-CM | POA: Diagnosis not present

## 2016-11-03 DIAGNOSIS — E1140 Type 2 diabetes mellitus with diabetic neuropathy, unspecified: Secondary | ICD-10-CM | POA: Diagnosis not present

## 2016-11-03 DIAGNOSIS — G600 Hereditary motor and sensory neuropathy: Secondary | ICD-10-CM | POA: Diagnosis not present

## 2016-11-09 ENCOUNTER — Ambulatory Visit (INDEPENDENT_AMBULATORY_CARE_PROVIDER_SITE_OTHER): Payer: Medicare Other | Admitting: Orthopaedic Surgery

## 2016-11-09 ENCOUNTER — Encounter: Payer: Self-pay | Admitting: Orthopaedic Surgery

## 2016-11-09 VITALS — BP 177/81 | HR 65 | Temp 97.3°F | Ht 61.0 in | Wt 204.0 lb

## 2016-11-09 DIAGNOSIS — M25511 Pain in right shoulder: Secondary | ICD-10-CM

## 2016-11-09 DIAGNOSIS — G600 Hereditary motor and sensory neuropathy: Secondary | ICD-10-CM | POA: Diagnosis not present

## 2016-11-09 DIAGNOSIS — M792 Neuralgia and neuritis, unspecified: Secondary | ICD-10-CM | POA: Diagnosis not present

## 2016-11-09 DIAGNOSIS — I739 Peripheral vascular disease, unspecified: Secondary | ICD-10-CM | POA: Diagnosis not present

## 2016-11-09 DIAGNOSIS — E1140 Type 2 diabetes mellitus with diabetic neuropathy, unspecified: Secondary | ICD-10-CM | POA: Diagnosis not present

## 2016-11-09 NOTE — Progress Notes (Signed)
Patient Nicole Yoder, female DOB:10/12/1935, 81 y.o. WYO:[id number]  Chief Complaint  Patient presents with  . Follow-up    Shoulder pain    HPI  Nicole Yoder is a 81 y.o. female who has more pain of the right shoulder.  The injection last time did not help.  She has pain most of the time now and has crepitus.  She has had PT in the past with minimal help.  She has degenerative changes.  I will get MRI to evaluate the severity of the arthritis.  She may need total shoulder. HPI  Body mass index is 38.55 kg/m.  ROS  Review of Systems  HENT: Negative for congestion.   Respiratory: Negative for cough and shortness of breath.   Cardiovascular: Negative for chest pain and leg swelling.  Endocrine: Positive for cold intolerance.  Musculoskeletal: Positive for arthralgias, back pain, joint swelling and myalgias.  Allergic/Immunologic: Positive for environmental allergies.    Past Medical History:  Diagnosis Date  . Anxiety   . High blood pressure   . High cholesterol   . Neuropathy [facility])     Past Surgical History:  Procedure Laterality Date  . broken ribs    . broken wrist    . CARPAL TUNNEL RELEASE Bilateral   . EYE SURGERY    . GANGLION CYST EXCISION    . TONSILLECTOMY AND ADENOIDECTOMY      Family History  Problem Relation Age of Onset  . Heart attack Mother   . Lung cancer Father     Social History Social History  Substance Use Topics  . Smoking status: Never Smoker  . Smokeless tobacco: Never Used  . Alcohol use No    Allergies  Allergen Reactions  . Prednisone Other (See Comments)    High blood pressure   . Celebrex [Celecoxib]   . Codeine   . Ivp Dye [Iodinated Diagnostic Agents]   . Lyrica [Pregabalin]   . Neurontin [Gabapentin]   . Penicillins   . Statins   . Sulfa Antibiotics   . Tramadol     Current Outpatient Prescriptions  Medication Sig Dispense Refill  . acetaminophen (TYLENOL) 500 MG tablet Take 650 mg by mouth 2 (two)  times daily.    Marland Kitchen atenolol (TENORMIN) 25 MG tablet Take 25 mg by mouth daily.    . Cholecalciferol (VITAMIN D PO) Take 1,000 Units by mouth daily.     . Cyanocobalamin (VITAMIN B 12) 100 MCG LOZG Take 1,000 mcg by mouth daily.    . diclofenac sodium (VOLTAREN) 1 % GEL Apply topically as directed.    Marland Kitchen levothyroxine (SYNTHROID, LEVOTHROID) 75 MCG tablet Take 75 mcg by mouth daily before breakfast.    . meloxicam (MOBIC) 15 MG tablet Take 15 mg by mouth daily.    . mirabegron ER (MYRBETRIQ) 25 MG TB24 tablet Take 25 mg by mouth daily. Reported on 01/22/2016    . sertraline (ZOLOFT) 100 MG tablet Take 100 mg by mouth daily.     No current facility-administered medications for this visit.      Physical Exam  Blood pressure (!) 177/81, pulse 65, temperature 97.3 F (36.3 C), height 5\' 1"  (1.549 m), weight 204 lb (92.5 kg).  Constitutional: overall normal hygiene, normal nutrition, well developed, normal grooming, normal body habitus. Assistive device:none  Musculoskeletal: gait and station Limp none, muscle tone and strength are normal, no tremors or atrophy is present.  .  Neurological: coordination overall normal.  Deep tendon reflex/nerve stretch intact.  Sensation normal.  Cranial nerves II-XII intact.   Skin:   Normal overall no scars, lesions, ulcers or rashes. No psoriasis.  Psychiatric: Alert and oriented x 3.  Recent memory intact, remote memory unclear.  Normal mood and affect. Well groomed.  Good eye contact.  Cardiovascular: overall no swelling, no varicosities, no edema bilaterally, normal temperatures of the legs and arms, no clubbing, cyanosis and good capillary refill.  Lymphatic: palpation is normal.  She has limited painful ROM of the right shoulder.  She has no paresthesias.  She has no redness. She has no neck pain.  The patient has been educated about the nature of the problem(s) and counseled on treatment options.  The patient appeared to understand what I have  discussed and is in agreement with it.  Encounter Diagnosis  Name Primary?  . Pain in joint of right shoulder Yes    PLAN Call if any problems.  Precautions discussed.  Continue current medications.   Return to clinic after MRI of the right shoulder   Electronically Signed Sanjuana Kava, MD 2/20/20182:54 PM

## 2016-11-11 DIAGNOSIS — G600 Hereditary motor and sensory neuropathy: Secondary | ICD-10-CM | POA: Diagnosis not present

## 2016-11-11 DIAGNOSIS — I10 Essential (primary) hypertension: Secondary | ICD-10-CM | POA: Diagnosis not present

## 2016-11-11 DIAGNOSIS — E1140 Type 2 diabetes mellitus with diabetic neuropathy, unspecified: Secondary | ICD-10-CM | POA: Diagnosis not present

## 2016-11-11 DIAGNOSIS — M792 Neuralgia and neuritis, unspecified: Secondary | ICD-10-CM | POA: Diagnosis not present

## 2016-11-11 DIAGNOSIS — M159 Polyosteoarthritis, unspecified: Secondary | ICD-10-CM | POA: Diagnosis not present

## 2016-11-11 DIAGNOSIS — I739 Peripheral vascular disease, unspecified: Secondary | ICD-10-CM | POA: Diagnosis not present

## 2016-11-12 ENCOUNTER — Telehealth: Payer: Self-pay | Admitting: Orthopaedic Surgery

## 2016-11-12 NOTE — Telephone Encounter (Signed)
Patient called wanting to see if her MRI could be set up at [facility] on [facility] in [place], instead of going to the one on [address]. She said it was scheduled for 3/3 and she was going to go ahead and cancel it. She said that driving on [address] makes her nervous.  Please call and advise.

## 2016-11-16 ENCOUNTER — Ambulatory Visit: Payer: Medicare Other | Admitting: Orthopaedic Surgery

## 2016-11-16 DIAGNOSIS — G600 Hereditary motor and sensory neuropathy: Secondary | ICD-10-CM | POA: Diagnosis not present

## 2016-11-16 DIAGNOSIS — I739 Peripheral vascular disease, unspecified: Secondary | ICD-10-CM | POA: Diagnosis not present

## 2016-11-16 DIAGNOSIS — M792 Neuralgia and neuritis, unspecified: Secondary | ICD-10-CM | POA: Diagnosis not present

## 2016-11-16 DIAGNOSIS — E1140 Type 2 diabetes mellitus with diabetic neuropathy, unspecified: Secondary | ICD-10-CM | POA: Diagnosis not present

## 2016-11-16 NOTE — Telephone Encounter (Signed)
The order was faxed to [facility] and the patient was notified.

## 2016-11-18 DIAGNOSIS — E1140 Type 2 diabetes mellitus with diabetic neuropathy, unspecified: Secondary | ICD-10-CM | POA: Diagnosis not present

## 2016-11-18 DIAGNOSIS — I739 Peripheral vascular disease, unspecified: Secondary | ICD-10-CM | POA: Diagnosis not present

## 2016-11-18 DIAGNOSIS — G600 Hereditary motor and sensory neuropathy: Secondary | ICD-10-CM | POA: Diagnosis not present

## 2016-11-18 DIAGNOSIS — M792 Neuralgia and neuritis, unspecified: Secondary | ICD-10-CM | POA: Diagnosis not present

## 2016-11-20 ENCOUNTER — Other Ambulatory Visit: Payer: Medicare Other

## 2016-11-23 DIAGNOSIS — M19011 Primary osteoarthritis, right shoulder: Secondary | ICD-10-CM | POA: Diagnosis not present

## 2016-11-23 DIAGNOSIS — E1140 Type 2 diabetes mellitus with diabetic neuropathy, unspecified: Secondary | ICD-10-CM | POA: Diagnosis not present

## 2016-11-23 DIAGNOSIS — I739 Peripheral vascular disease, unspecified: Secondary | ICD-10-CM | POA: Diagnosis not present

## 2016-11-23 DIAGNOSIS — M792 Neuralgia and neuritis, unspecified: Secondary | ICD-10-CM | POA: Diagnosis not present

## 2016-11-23 DIAGNOSIS — M7551 Bursitis of right shoulder: Secondary | ICD-10-CM | POA: Diagnosis not present

## 2016-11-23 DIAGNOSIS — M75121 Complete rotator cuff tear or rupture of right shoulder, not specified as traumatic: Secondary | ICD-10-CM | POA: Diagnosis not present

## 2016-11-23 DIAGNOSIS — G600 Hereditary motor and sensory neuropathy: Secondary | ICD-10-CM | POA: Diagnosis not present

## 2016-11-23 DIAGNOSIS — M62511 Muscle wasting and atrophy, not elsewhere classified, right shoulder: Secondary | ICD-10-CM | POA: Diagnosis not present

## 2016-11-25 DIAGNOSIS — I739 Peripheral vascular disease, unspecified: Secondary | ICD-10-CM | POA: Diagnosis not present

## 2016-11-25 DIAGNOSIS — M792 Neuralgia and neuritis, unspecified: Secondary | ICD-10-CM | POA: Diagnosis not present

## 2016-11-25 DIAGNOSIS — G600 Hereditary motor and sensory neuropathy: Secondary | ICD-10-CM | POA: Diagnosis not present

## 2016-11-25 DIAGNOSIS — E1140 Type 2 diabetes mellitus with diabetic neuropathy, unspecified: Secondary | ICD-10-CM | POA: Diagnosis not present

## 2016-11-30 DIAGNOSIS — E1140 Type 2 diabetes mellitus with diabetic neuropathy, unspecified: Secondary | ICD-10-CM | POA: Diagnosis not present

## 2016-11-30 DIAGNOSIS — M79671 Pain in right foot: Secondary | ICD-10-CM | POA: Diagnosis not present

## 2016-11-30 DIAGNOSIS — G5790 Unspecified mononeuropathy of unspecified lower limb: Secondary | ICD-10-CM | POA: Diagnosis not present

## 2016-11-30 DIAGNOSIS — I739 Peripheral vascular disease, unspecified: Secondary | ICD-10-CM | POA: Diagnosis not present

## 2016-11-30 DIAGNOSIS — M792 Neuralgia and neuritis, unspecified: Secondary | ICD-10-CM | POA: Diagnosis not present

## 2016-11-30 DIAGNOSIS — G600 Hereditary motor and sensory neuropathy: Secondary | ICD-10-CM | POA: Diagnosis not present

## 2016-12-02 ENCOUNTER — Encounter: Payer: Self-pay | Admitting: Orthopaedic Surgery

## 2016-12-02 ENCOUNTER — Ambulatory Visit (INDEPENDENT_AMBULATORY_CARE_PROVIDER_SITE_OTHER): Payer: Medicare Other | Admitting: Orthopaedic Surgery

## 2016-12-02 VITALS — BP 139/73 | HR 70 | Ht 61.0 in | Wt 200.0 lb

## 2016-12-02 DIAGNOSIS — M792 Neuralgia and neuritis, unspecified: Secondary | ICD-10-CM | POA: Diagnosis not present

## 2016-12-02 DIAGNOSIS — G600 Hereditary motor and sensory neuropathy: Secondary | ICD-10-CM | POA: Diagnosis not present

## 2016-12-02 DIAGNOSIS — E1140 Type 2 diabetes mellitus with diabetic neuropathy, unspecified: Secondary | ICD-10-CM | POA: Diagnosis not present

## 2016-12-02 DIAGNOSIS — M75121 Complete rotator cuff tear or rupture of right shoulder, not specified as traumatic: Secondary | ICD-10-CM

## 2016-12-02 DIAGNOSIS — I739 Peripheral vascular disease, unspecified: Secondary | ICD-10-CM | POA: Diagnosis not present

## 2016-12-02 NOTE — Progress Notes (Signed)
Patient Nicole Yoder, female DOB:11-11-1935, 81 y.o. [id number]:[id number]  Chief Complaint  Patient presents with  . Follow-up    mri review right shoulder    HPI  ANGELLY Yoder is a 81 y.o. female who has chronic pain of the right shoulder.  The medicine and injections have not helped. She had a MRI done at [facility] and it shows a complete rotator cuff tear on the right massive with complete retraction of the muscles as well as biceps tendon not being seen.  She has bursitis and osteoarthritis as well  I have explained the findings to her.   She does not want to consider surgery at this time.  I have recommended PT/OT to her to be done at [facility].  HPI  Body mass index is 37.79 kg/m.  ROS  Review of Systems  HENT: Negative for congestion.   Respiratory: Negative for cough and shortness of breath.   Cardiovascular: Negative for chest pain and leg swelling.  Endocrine: Positive for cold intolerance.  Musculoskeletal: Positive for arthralgias, back pain, joint swelling and myalgias.  Allergic/Immunologic: Positive for environmental allergies.    Past Medical History:  Diagnosis Date  . Anxiety   . High blood pressure   . High cholesterol   . Neuropathy [facility])     Past Surgical History:  Procedure Laterality Date  . broken ribs    . broken wrist    . CARPAL TUNNEL RELEASE Bilateral   . EYE SURGERY    . GANGLION CYST EXCISION    . TONSILLECTOMY AND ADENOIDECTOMY      Family History  Problem Relation Age of Onset  . Heart attack Mother   . Lung cancer Father     Social History Social History  Substance Use Topics  . Smoking status: Never Smoker  . Smokeless tobacco: Never Used  . Alcohol use No    Allergies  Allergen Reactions  . Prednisone Other (See Comments)    High blood pressure   . Celebrex [Celecoxib]   . Codeine   . Ivp Dye [Iodinated Diagnostic Agents]   . Lyrica [Pregabalin]   . Neurontin [Gabapentin]   . Penicillins   .  Statins   . Sulfa Antibiotics   . Tramadol     Current Outpatient Prescriptions  Medication Sig Dispense Refill  . acetaminophen (TYLENOL) 500 MG tablet Take 650 mg by mouth 2 (two) times daily.    Marland Kitchen atenolol (TENORMIN) 25 MG tablet Take 25 mg by mouth daily.    . Cholecalciferol (VITAMIN D PO) Take 1,000 Units by mouth daily.     . Cyanocobalamin (VITAMIN B 12) 100 MCG LOZG Take 1,000 mcg by mouth daily.    . diclofenac sodium (VOLTAREN) 1 % GEL Apply topically as directed.    Marland Kitchen levothyroxine (SYNTHROID, LEVOTHROID) 75 MCG tablet Take 75 mcg by mouth daily before breakfast.    . meloxicam (MOBIC) 15 MG tablet Take 15 mg by mouth daily.    . mirabegron ER (MYRBETRIQ) 25 MG TB24 tablet Take 25 mg by mouth daily. Reported on 01/22/2016    . sertraline (ZOLOFT) 100 MG tablet Take 100 mg by mouth daily.     No current facility-administered medications for this visit.      Physical Exam  Blood pressure 139/73, pulse 70, height 5\' 1"  (1.549 m), weight 200 lb (90.7 kg).  Constitutional: overall normal hygiene, normal nutrition, well developed, normal grooming, normal body habitus. Assistive device:walker  Musculoskeletal: gait and station Limp none, muscle  tone and strength are normal, no tremors or atrophy is present.  .  Neurological: coordination overall normal.  Deep tendon reflex/nerve stretch intact.  Sensation normal.  Cranial nerves II-XII intact.   Skin:   Normal overall no scars, lesions, ulcers or rashes. No psoriasis.  Psychiatric: Alert and oriented x 3.  Recent memory intact, remote memory unclear.  Normal mood and affect. Well groomed.  Good eye contact.  Cardiovascular: overall no swelling, no varicosities, no edema bilaterally, normal temperatures of the legs and arms, no clubbing, cyanosis and good capillary refill.  Lymphatic: palpation is normal.  She has painful limited motion of the right shoulder with no effusion.  NV is intact.  The patient has been educated  about the nature of the problem(s) and counseled on treatment options.  The patient appeared to understand what I have discussed and is in agreement with it.  Encounter Diagnosis  Name Primary?  . Complete tear of right rotator cuff Yes    PLAN Call if any problems.  Precautions discussed.  Continue current medications.   Return to clinic 3 weeks   Begin PT/OT of the right shoulder.  Electronically Signed Sanjuana Kava, MD 3/15/20183:34 PM

## 2016-12-06 DIAGNOSIS — M75121 Complete rotator cuff tear or rupture of right shoulder, not specified as traumatic: Secondary | ICD-10-CM | POA: Diagnosis not present

## 2016-12-06 DIAGNOSIS — M25511 Pain in right shoulder: Secondary | ICD-10-CM | POA: Diagnosis not present

## 2016-12-09 DIAGNOSIS — M25511 Pain in right shoulder: Secondary | ICD-10-CM | POA: Diagnosis not present

## 2016-12-09 DIAGNOSIS — M75121 Complete rotator cuff tear or rupture of right shoulder, not specified as traumatic: Secondary | ICD-10-CM | POA: Diagnosis not present

## 2016-12-13 DIAGNOSIS — M75121 Complete rotator cuff tear or rupture of right shoulder, not specified as traumatic: Secondary | ICD-10-CM | POA: Diagnosis not present

## 2016-12-13 DIAGNOSIS — M25511 Pain in right shoulder: Secondary | ICD-10-CM | POA: Diagnosis not present

## 2016-12-16 DIAGNOSIS — M75121 Complete rotator cuff tear or rupture of right shoulder, not specified as traumatic: Secondary | ICD-10-CM | POA: Diagnosis not present

## 2016-12-16 DIAGNOSIS — M25511 Pain in right shoulder: Secondary | ICD-10-CM | POA: Diagnosis not present

## 2016-12-20 DIAGNOSIS — M75101 Unspecified rotator cuff tear or rupture of right shoulder, not specified as traumatic: Secondary | ICD-10-CM | POA: Diagnosis not present

## 2016-12-20 DIAGNOSIS — M25511 Pain in right shoulder: Secondary | ICD-10-CM | POA: Diagnosis not present

## 2016-12-23 ENCOUNTER — Encounter: Payer: Self-pay | Admitting: Orthopaedic Surgery

## 2016-12-23 ENCOUNTER — Ambulatory Visit (INDEPENDENT_AMBULATORY_CARE_PROVIDER_SITE_OTHER): Payer: Medicare Other | Admitting: Orthopaedic Surgery

## 2016-12-23 VITALS — BP 144/79 | HR 82 | Temp 97.7°F | Ht 61.0 in | Wt 198.0 lb

## 2016-12-23 DIAGNOSIS — M79642 Pain in left hand: Secondary | ICD-10-CM

## 2016-12-23 DIAGNOSIS — M79641 Pain in right hand: Secondary | ICD-10-CM

## 2016-12-23 DIAGNOSIS — M75121 Complete rotator cuff tear or rupture of right shoulder, not specified as traumatic: Secondary | ICD-10-CM

## 2016-12-23 NOTE — Progress Notes (Signed)
Patient Nicole Yoder, female DOB:Nov 18, 1935, 81 y.o. CNO:[id number]  Chief Complaint  Patient presents with  . Follow-up    right shoulder     HPI  Nicole Yoder is a 81 y.o. female who has right shoulder pain and hand pain.  She has been to PT at [facility] and is improving but still has some pain. She sleeps well.  She has no new trauma. HPI  Body mass index is 37.41 kg/m.  ROS  Review of Systems  HENT: Negative for congestion.   Respiratory: Negative for cough and shortness of breath.   Cardiovascular: Negative for chest pain and leg swelling.  Endocrine: Positive for cold intolerance.  Musculoskeletal: Positive for arthralgias, back pain, joint swelling and myalgias.  Allergic/Immunologic: Positive for environmental allergies.    Past Medical History:  Diagnosis Date  . Anxiety   . High blood pressure   . High cholesterol   . Neuropathy [facility])     Past Surgical History:  Procedure Laterality Date  . broken ribs    . broken wrist    . CARPAL TUNNEL RELEASE Bilateral   . EYE SURGERY    . GANGLION CYST EXCISION    . TONSILLECTOMY AND ADENOIDECTOMY      Family History  Problem Relation Age of Onset  . Heart attack Mother   . Lung cancer Father     Social History Social History  Substance Use Topics  . Smoking status: Never Smoker  . Smokeless tobacco: Never Used  . Alcohol use No    Allergies  Allergen Reactions  . Prednisone Other (See Comments)    High blood pressure   . Celebrex [Celecoxib]   . Codeine   . Ivp Dye [Iodinated Diagnostic Agents]   . Lyrica [Pregabalin]   . Neurontin [Gabapentin]   . Penicillins   . Statins   . Sulfa Antibiotics   . Tramadol     Current Outpatient Prescriptions  Medication Sig Dispense Refill  . acetaminophen (TYLENOL) 500 MG tablet Take 650 mg by mouth 2 (two) times daily.    Marland Kitchen atenolol (TENORMIN) 25 MG tablet Take 25 mg by mouth daily.    . Cholecalciferol (VITAMIN D PO) Take 1,000 Units  by mouth daily.     . Cyanocobalamin (VITAMIN B 12) 100 MCG LOZG Take 1,000 mcg by mouth daily.    . diclofenac sodium (VOLTAREN) 1 % GEL Apply topically as directed.    Marland Kitchen levothyroxine (SYNTHROID, LEVOTHROID) 75 MCG tablet Take 75 mcg by mouth daily before breakfast.    . meloxicam (MOBIC) 15 MG tablet Take 15 mg by mouth daily.    . mirabegron ER (MYRBETRIQ) 25 MG TB24 tablet Take 25 mg by mouth daily. Reported on 01/22/2016    . sertraline (ZOLOFT) 100 MG tablet Take 100 mg by mouth daily.     No current facility-administered medications for this visit.      Physical Exam  Blood pressure (!) 144/79, pulse 82, temperature 97.7 F (36.5 C), height 5\' 1"  (1.549 m), weight 198 lb (89.8 kg).  Constitutional: overall normal hygiene, normal nutrition, well developed, normal grooming, normal body habitus. Assistive device:none  Musculoskeletal: gait and station Limp none, muscle tone and strength are normal, no tremors or atrophy is present.  .  Neurological: coordination overall normal.  Deep tendon reflex/nerve stretch intact.  Sensation normal.  Cranial nerves II-XII intact.   Skin:   Normal overall no scars, lesions, ulcers or rashes. No psoriasis.  Psychiatric: Alert and oriented  x 3.  Recent memory intact, remote memory unclear.  Normal mood and affect. Well groomed.  Good eye contact.  Cardiovascular: overall no swelling, no varicosities, no edema bilaterally, normal temperatures of the legs and arms, no clubbing, cyanosis and good capillary refill.  Lymphatic: palpation is normal.  Examination of right Upper Extremity is done.  Inspection:   Overall:  Elbow non-tender without crepitus or defects, forearm non-tender without crepitus or defects, wrist non-tender without crepitus or defects, hand non-tender.    Shoulder: with glenohumeral joint tenderness, without effusion.   Upper arm: without swelling and tenderness   Range of motion:   Overall:  Full range of motion of the  elbow, full range of motion of wrist and full range of motion in fingers.   Shoulder:  right  140 degrees forward flexion; 90 degrees abduction; 25 degrees internal rotation, 25 degrees external rotation, 10 degrees extension, 35 degrees adduction.   Stability:   Overall:  Shoulder, elbow and wrist stable   Strength and Tone:   Overall full shoulder muscles strength, full upper arm strength and normal upper arm bulk and tone.   The patient has been educated about the nature of the problem(s) and counseled on treatment options.  The patient appeared to understand what I have discussed and is in agreement with it.  Encounter Diagnoses  Name Primary?  . Complete tear of right rotator cuff Yes  . Bilateral hand pain     PLAN Call if any problems.  Precautions discussed.  Continue current medications.   Return to clinic 1 month   Finish up PT.  Electronically Signed Sanjuana Kava, MD 4/5/20181:51 PM

## 2016-12-24 DIAGNOSIS — M75101 Unspecified rotator cuff tear or rupture of right shoulder, not specified as traumatic: Secondary | ICD-10-CM | POA: Diagnosis not present

## 2016-12-24 DIAGNOSIS — M25511 Pain in right shoulder: Secondary | ICD-10-CM | POA: Diagnosis not present

## 2016-12-27 DIAGNOSIS — M159 Polyosteoarthritis, unspecified: Secondary | ICD-10-CM | POA: Diagnosis not present

## 2016-12-27 DIAGNOSIS — I10 Essential (primary) hypertension: Secondary | ICD-10-CM | POA: Diagnosis not present

## 2017-01-19 DIAGNOSIS — I10 Essential (primary) hypertension: Secondary | ICD-10-CM | POA: Diagnosis not present

## 2017-01-19 DIAGNOSIS — M159 Polyosteoarthritis, unspecified: Secondary | ICD-10-CM | POA: Diagnosis not present

## 2017-01-27 ENCOUNTER — Ambulatory Visit: Payer: Medicare Other | Admitting: Orthopaedic Surgery

## 2017-01-27 DIAGNOSIS — M792 Neuralgia and neuritis, unspecified: Secondary | ICD-10-CM | POA: Diagnosis not present

## 2017-01-27 DIAGNOSIS — E7800 Pure hypercholesterolemia, unspecified: Secondary | ICD-10-CM | POA: Diagnosis not present

## 2017-01-27 DIAGNOSIS — K219 Gastro-esophageal reflux disease without esophagitis: Secondary | ICD-10-CM | POA: Diagnosis not present

## 2017-01-27 DIAGNOSIS — Z6838 Body mass index (BMI) 38.0-38.9, adult: Secondary | ICD-10-CM | POA: Diagnosis not present

## 2017-01-27 DIAGNOSIS — G8929 Other chronic pain: Secondary | ICD-10-CM | POA: Diagnosis not present

## 2017-01-27 DIAGNOSIS — Z299 Encounter for prophylactic measures, unspecified: Secondary | ICD-10-CM | POA: Diagnosis not present

## 2017-01-27 DIAGNOSIS — E039 Hypothyroidism, unspecified: Secondary | ICD-10-CM | POA: Diagnosis not present

## 2017-01-27 DIAGNOSIS — M109 Gout, unspecified: Secondary | ICD-10-CM | POA: Diagnosis not present

## 2017-01-27 DIAGNOSIS — Z789 Other specified health status: Secondary | ICD-10-CM | POA: Diagnosis not present

## 2017-01-27 DIAGNOSIS — I10 Essential (primary) hypertension: Secondary | ICD-10-CM | POA: Diagnosis not present

## 2017-01-27 DIAGNOSIS — E041 Nontoxic single thyroid nodule: Secondary | ICD-10-CM | POA: Diagnosis not present

## 2017-01-27 DIAGNOSIS — E669 Obesity, unspecified: Secondary | ICD-10-CM | POA: Diagnosis not present

## 2017-02-01 ENCOUNTER — Ambulatory Visit (INDEPENDENT_AMBULATORY_CARE_PROVIDER_SITE_OTHER): Payer: Medicare Other | Admitting: Orthopaedic Surgery

## 2017-02-01 VITALS — BP 134/68 | HR 60 | Temp 97.3°F | Ht 61.0 in | Wt 195.0 lb

## 2017-02-01 DIAGNOSIS — M75121 Complete rotator cuff tear or rupture of right shoulder, not specified as traumatic: Secondary | ICD-10-CM | POA: Diagnosis not present

## 2017-02-01 NOTE — Progress Notes (Signed)
PROCEDURE NOTE:  The patient request injection, verbal consent was obtained.  The right shoulder was prepped appropriately after time out was performed.   Sterile technique was observed and injection of 1 cc of Depo-Medrol 40 mg with several cc's of plain xylocaine. Anesthesia was provided by ethyl chloride and a 20-gauge needle was used to inject the shoulder area. A posterior approach was used.  The injection was tolerated well.  A band aid dressing was applied.  The patient was advised to apply ice later today and tomorrow to the injection sight as needed.  Return in one month.  Call if any problem.  Encounter Diagnosis  Name Primary?  . Complete tear of right rotator cuff Yes

## 2017-02-24 DIAGNOSIS — Z1389 Encounter for screening for other disorder: Secondary | ICD-10-CM | POA: Diagnosis not present

## 2017-02-24 DIAGNOSIS — R5383 Other fatigue: Secondary | ICD-10-CM | POA: Diagnosis not present

## 2017-02-24 DIAGNOSIS — E039 Hypothyroidism, unspecified: Secondary | ICD-10-CM | POA: Diagnosis not present

## 2017-02-24 DIAGNOSIS — E7800 Pure hypercholesterolemia, unspecified: Secondary | ICD-10-CM | POA: Diagnosis not present

## 2017-02-24 DIAGNOSIS — K219 Gastro-esophageal reflux disease without esophagitis: Secondary | ICD-10-CM | POA: Diagnosis not present

## 2017-02-24 DIAGNOSIS — Z0000 Encounter for general adult medical examination without abnormal findings: Secondary | ICD-10-CM | POA: Diagnosis not present

## 2017-02-24 DIAGNOSIS — Z6838 Body mass index (BMI) 38.0-38.9, adult: Secondary | ICD-10-CM | POA: Diagnosis not present

## 2017-02-24 DIAGNOSIS — Z7189 Other specified counseling: Secondary | ICD-10-CM | POA: Diagnosis not present

## 2017-02-24 DIAGNOSIS — I10 Essential (primary) hypertension: Secondary | ICD-10-CM | POA: Diagnosis not present

## 2017-02-24 DIAGNOSIS — E559 Vitamin D deficiency, unspecified: Secondary | ICD-10-CM | POA: Diagnosis not present

## 2017-02-24 DIAGNOSIS — Z1211 Encounter for screening for malignant neoplasm of colon: Secondary | ICD-10-CM | POA: Diagnosis not present

## 2017-02-24 DIAGNOSIS — Z299 Encounter for prophylactic measures, unspecified: Secondary | ICD-10-CM | POA: Diagnosis not present

## 2017-02-24 DIAGNOSIS — Z79899 Other long term (current) drug therapy: Secondary | ICD-10-CM | POA: Diagnosis not present

## 2017-02-24 DIAGNOSIS — F329 Major depressive disorder, single episode, unspecified: Secondary | ICD-10-CM | POA: Diagnosis not present

## 2017-03-01 ENCOUNTER — Ambulatory Visit: Payer: Medicare Other | Admitting: Orthopaedic Surgery

## 2017-03-01 DIAGNOSIS — H43393 Other vitreous opacities, bilateral: Secondary | ICD-10-CM | POA: Diagnosis not present

## 2017-03-01 DIAGNOSIS — H40013 Open angle with borderline findings, low risk, bilateral: Secondary | ICD-10-CM | POA: Diagnosis not present

## 2017-03-01 DIAGNOSIS — Z961 Presence of intraocular lens: Secondary | ICD-10-CM | POA: Diagnosis not present

## 2017-03-08 ENCOUNTER — Ambulatory Visit (INDEPENDENT_AMBULATORY_CARE_PROVIDER_SITE_OTHER): Payer: Medicare Other | Admitting: Orthopaedic Surgery

## 2017-03-08 ENCOUNTER — Encounter: Payer: Self-pay | Admitting: Orthopaedic Surgery

## 2017-03-08 VITALS — BP 150/67 | HR 62 | Ht 61.0 in | Wt 196.0 lb

## 2017-03-08 DIAGNOSIS — M19011 Primary osteoarthritis, right shoulder: Secondary | ICD-10-CM | POA: Diagnosis not present

## 2017-03-08 DIAGNOSIS — M75121 Complete rotator cuff tear or rupture of right shoulder, not specified as traumatic: Secondary | ICD-10-CM

## 2017-03-08 NOTE — Progress Notes (Signed)
Patient Nicole Yoder, female DOB:Nov 15, 1935, 81 y.o. SFK:[id number]  Chief Complaint  Patient presents with  . Shoulder Pain    right    HPI  Nicole Yoder is a 81 y.o. female who has long chronic pain of the right shoulder and significant degenerative changes present.  She has been told in the past about a total shoulder surgery possibility but she has been resistant to have it done.  Over the last few months she realized she can hardly use the right arm without having shoulder pain that lasts for hours.  She is very uncomfortable.  She is getting worse not better.  Medicine and injections have not helped.  Ice, heat or rubs do not help now.  She has no numbness, no trauma, no redness, no swelling.  She has read about total shoulders and has asked multiple questions about it. She talked this over with her family doctor also.  She would now like to consider the surgery.  We had a good long talk.  I no longer do surgery and will need to refer her.  I have suggested Dr. Marlou Sa at [facility] in [place].  She is agreeable to this. HPI  Body mass index is 37.03 kg/m.  ROS  Review of Systems  HENT: Negative for congestion.   Respiratory: Negative for cough and shortness of breath.   Cardiovascular: Negative for chest pain and leg swelling.  Endocrine: Positive for cold intolerance.  Musculoskeletal: Positive for arthralgias, back pain, joint swelling and myalgias.  Allergic/Immunologic: Positive for environmental allergies.    Past Medical History:  Diagnosis Date  . Anxiety   . High blood pressure   . High cholesterol   . Neuropathy     Past Surgical History:  Procedure Laterality Date  . broken ribs    . broken wrist    . CARPAL TUNNEL RELEASE Bilateral   . EYE SURGERY    . GANGLION CYST EXCISION    . TONSILLECTOMY AND ADENOIDECTOMY      Family History  Problem Relation Age of Onset  . Heart attack Mother   . Lung cancer Father     Social  History Social History  Substance Use Topics  . Smoking status: Never Smoker  . Smokeless tobacco: Never Used  . Alcohol use No    Allergies  Allergen Reactions  . Prednisone Other (See Comments)    High blood pressure   . Celebrex [Celecoxib]   . Codeine   . Ivp Dye [Iodinated Diagnostic Agents]   . Lyrica [Pregabalin]   . Neurontin [Gabapentin]   . Penicillins   . Statins   . Sulfa Antibiotics   . Tramadol     Current Outpatient Prescriptions  Medication Sig Dispense Refill  . acetaminophen (TYLENOL) 500 MG tablet Take 650 mg by mouth 2 (two) times daily.    Marland Kitchen atenolol (TENORMIN) 25 MG tablet Take 25 mg by mouth daily.    . Cholecalciferol (VITAMIN D PO) Take 1,000 Units by mouth daily.     . Cyanocobalamin (VITAMIN B 12) 100 MCG LOZG Take 1,000 mcg by mouth daily.    . diclofenac sodium (VOLTAREN) 1 % GEL Apply topically as directed.    Marland Kitchen levothyroxine (SYNTHROID, LEVOTHROID) 75 MCG tablet Take 75 mcg by mouth daily before breakfast.    . meloxicam (MOBIC) 15 MG tablet Take 15 mg by mouth daily.    . mirabegron ER (MYRBETRIQ) 25 MG TB24 tablet Take 25 mg by mouth daily. Reported on 01/22/2016    .  sertraline (ZOLOFT) 100 MG tablet Take 100 mg by mouth daily.     No current facility-administered medications for this visit.      Physical Exam  Blood pressure (!) 150/67, pulse 62, height 5\' 1"  (1.549 m), weight 196 lb (88.9 kg).  Constitutional: overall normal hygiene, normal nutrition, well developed, normal grooming, normal body habitus. Assistive device:none  Musculoskeletal: gait and station Limp none, muscle tone and strength are normal, no tremors or atrophy is present.  .  Neurological: coordination overall normal.  Deep tendon reflex/nerve stretch intact.  Sensation normal.  Cranial nerves II-XII intact.   Skin:   Normal overall no scars, lesions, ulcers or rashes. No psoriasis.  Psychiatric: Alert and oriented x 3.  Recent memory intact, remote memory  unclear.  Normal mood and affect. Well groomed.  Good eye contact.  Cardiovascular: overall no swelling, no varicosities, no edema bilaterally, normal temperatures of the legs and arms, no clubbing, cyanosis and good capillary refill.  Lymphatic: palpation is normal.  She has limited ROM of the right shoulder.  She is unable to move it without pain. She has crepitus. NV is intact and grips are normal.  She has no effusion or redness. ROM of the neck is good.  She has slight pain of the left shoulder.  The patient has been educated about the nature of the problem(s) and counseled on treatment options.  The patient appeared to understand what I have discussed and is in agreement with it.  Encounter Diagnoses  Name Primary?  . Osteoarthritis of right shoulder, unspecified osteoarthritis type Yes  . Complete tear of right rotator cuff     PLAN Call if any problems.  Precautions discussed.  Continue current medications.   Return to clinic to see Dr. Marlou Sa for discussion about a total shoulder and see if she is a candidate for the procedure.   Electronically [place], MD 6/19/20184:03 PM

## 2017-03-15 DIAGNOSIS — E2839 Other primary ovarian failure: Secondary | ICD-10-CM | POA: Diagnosis not present

## 2017-03-31 ENCOUNTER — Ambulatory Visit (INDEPENDENT_AMBULATORY_CARE_PROVIDER_SITE_OTHER): Payer: Medicare Other | Admitting: Orthopedic Surgery

## 2017-03-31 ENCOUNTER — Encounter (INDEPENDENT_AMBULATORY_CARE_PROVIDER_SITE_OTHER): Payer: Self-pay | Admitting: Orthopedic Surgery

## 2017-03-31 DIAGNOSIS — M19011 Primary osteoarthritis, right shoulder: Secondary | ICD-10-CM

## 2017-03-31 DIAGNOSIS — M75121 Complete rotator cuff tear or rupture of right shoulder, not specified as traumatic: Secondary | ICD-10-CM | POA: Diagnosis not present

## 2017-04-01 NOTE — Progress Notes (Signed)
Office Visit Note   Patient: Nicole Yoder           Date of Birth: 1935/12/04           MRN: [id number] Visit Date: 03/31/2017 Requested by: Monico Blitz, MD [place], [place] [address] PCP: Monico Blitz, MD  Subjective: Chief Complaint  Patient presents with  . Right Shoulder - Pain    HPI: Nicole Yoder is a 81 year old patient with right shoulder pain.  She has a 10 year history of pain.  For the past several months her pain has become relatively unbearable and she reports a lot of functional disability with her right arm.  She fell off the porch about 10 years ago and injured her right wrist and hand as well as injured her lung.  She's had an MRI done at [facility] which is reviewed.  It shows irreparable rotator cuff tears and rotator cuff arthropathy and arthritis.  She has had injections into the shoulder which have given her temporary relief.  She has a sister who lives with her at home.  She does not do well with tramadol and codeine.  She uses ice he rubs and creams on the shoulder but still has functional disability and pain.              ROS: All systems reviewed are negative as they relate to the chief complaint within the history of present illness.  Patient denies  fevers or chills.   Assessment & Plan: Visit Diagnoses:  1. Primary osteoarthritis of right shoulder   2. Complete rotator cuff tear or rupture of right shoulder, not specified as traumatic     Plan: Impression is right shoulder rotator cuff arthropathy and arthritis.  Plan is extended discussion today with Nicole Yoder about operative and nonoperative treatment.  In general Nicole Yoder is reasonably healthy.  She is failed conservative management and has limitation of function with less than 70 of abduction and forward flexion in that right arm.  MRI scan shows endstage arthritis in irreparable rotator cuff tears.  I think since he is a reasonable candidate for reverse shoulder replacement.  The risks and  benefits are discussed with the patient including not limited to infection or vessel damage dislocation as well as potential need for more surgery.  Patient understands the risks and benefits and wishes to proceed.  In general I think Nicole Yoder is a good candidate because her pain will be improved and I think her function will also be improved with this procedure.  All questions answered.  Follow-Up Instructions: No Follow-up on file.   Orders:  Orders Placed This Encounter  Procedures  . CT SHOULDER RIGHT WO CONTRAST   No orders of the defined types were placed in this encounter.     Procedures: No procedures performed   Clinical Data: No additional findings.  Objective: Vital Signs: There were no vitals taken for this visit.  Physical Exam:   Constitutional: Patient appears well-developed HEENT:  Head: Normocephalic Eyes:EOM are normal Neck: Normal range of motion Cardiovascular: Normal rate Pulmonary/chest: Effort normal Neurologic: Patient is alert Skin: Skin is warm Psychiatric: Patient has normal mood and affect    Ortho Exam: Orthopedic exam demonstrates good cervical spine range of motion.  5 out of 5 grip EPL FPL interosseous wrist flexion-extension biceps triceps and deltoid strength.  Deltoid is functional on the right left-hand side.  Rotator cuff strength testing is deficient with infraspinous supraspinatus and subscap testing.  She has passive range  of motion to about over 90 of forward flexion and about 80 of passive glenohumeral abduction.  Actively she's much less than that consistent with rotator cuff arthropathy.  Radial pulses intact on the right.  Specialty Comments:  No specialty comments available.  Imaging: No results found.   PMFS History: Patient Active Problem List   Diagnosis Date Noted  . Primary osteoarthritis of right shoulder 03/31/2017  . Complete rotator cuff tear or rupture of right shoulder, not specified as traumatic 03/31/2017    . Trigger middle finger of left hand 01/22/2016  . Bilateral hand pain 01/22/2016  . Dysphagia, pharyngoesophageal phase 07/30/2014  . Spinal stenosis in cervical region 01/08/2014  . Abnormality of gait 03/08/2013  . Morbid obesity ([place]) 03/08/2013  . Numbness 03/08/2013   Past Medical History:  Diagnosis Date  . Anxiety   . High blood pressure   . High cholesterol   . Neuropathy     Family History  Problem Relation Age of Onset  . Heart attack Mother   . Lung cancer Father     Past Surgical History:  Procedure Laterality Date  . broken ribs    . broken wrist    . CARPAL TUNNEL RELEASE Bilateral   . EYE SURGERY    . GANGLION CYST EXCISION    . TONSILLECTOMY AND ADENOIDECTOMY     Social History   Occupational History  .      retired   Social History Main Topics  . Smoking status: Never Smoker  . Smokeless tobacco: Never Used  . Alcohol use No  . Drug use: No  . Sexual activity: Not on file

## 2017-04-07 ENCOUNTER — Telehealth (INDEPENDENT_AMBULATORY_CARE_PROVIDER_SITE_OTHER): Payer: Self-pay | Admitting: Orthopedic Surgery

## 2017-04-07 NOTE — Telephone Encounter (Signed)
Called pt to set up surgery date. Pt did not want to schedule at this time but does want to speak with you after her CT scan she has next week on 7/25. Pt stated she fell last Thursday at home out of the bed. It took 2 people to help her up and her shoulder was in excruciating pain afterwards. She stated that the pain has gotten some better, but not completely. She stated that she wants to talk about other options she may have and ways to manage the pain without surgery. But she would like to wait to speak with you after she has the CT scan completed. CB#: [phone]

## 2017-04-08 ENCOUNTER — Encounter (INDEPENDENT_AMBULATORY_CARE_PROVIDER_SITE_OTHER): Payer: Self-pay | Admitting: Orthopaedic Surgery

## 2017-04-08 NOTE — Telephone Encounter (Signed)
y

## 2017-04-08 NOTE — Telephone Encounter (Signed)
She did not give an exact reason, just that she would like to speak to you about this.

## 2017-04-10 NOTE — Telephone Encounter (Signed)
I called pls cancel surgery - also cancel ct scan thx

## 2017-04-11 NOTE — Telephone Encounter (Signed)
Please see note about cancelling patients CT scan.

## 2017-04-11 NOTE — Telephone Encounter (Signed)
See note about cancelling.

## 2017-04-11 NOTE — Telephone Encounter (Signed)
appt cancelled for CT scan at [facility]

## 2017-04-11 NOTE — Telephone Encounter (Signed)
Please see below about canceling CT scan

## 2017-04-13 ENCOUNTER — Ambulatory Visit (HOSPITAL_COMMUNITY): Payer: Medicare Other

## 2017-04-21 ENCOUNTER — Encounter: Payer: Self-pay | Admitting: Orthopaedic Surgery

## 2017-04-21 ENCOUNTER — Ambulatory Visit (INDEPENDENT_AMBULATORY_CARE_PROVIDER_SITE_OTHER): Payer: Medicare Other

## 2017-04-21 ENCOUNTER — Ambulatory Visit (INDEPENDENT_AMBULATORY_CARE_PROVIDER_SITE_OTHER): Payer: Medicare Other | Admitting: Orthopaedic Surgery

## 2017-04-21 VITALS — BP 150/69 | HR 65 | Temp 96.7°F | Ht 61.0 in | Wt 194.0 lb

## 2017-04-21 DIAGNOSIS — M25521 Pain in right elbow: Secondary | ICD-10-CM

## 2017-04-21 DIAGNOSIS — M25511 Pain in right shoulder: Secondary | ICD-10-CM

## 2017-04-21 NOTE — Progress Notes (Signed)
Patient Nicole Yoder, female DOB:Mar 09, 1936, 81 y.o. [id number]:[id number]  Chief Complaint  Patient presents with  . Shoulder Pain    fell 03/31/17 increased right shoulder pain since the fall and right elbow.    HPI  Nicole Yoder is a 81 y.o. female who fell at her home and hurt her right shoulder and right elbow last week.  She has prior problems with the right shoulder secondary to severe DJD.  She had considered a total shoulder, but has decided not to have it done.  Since this fall, her pain in worse.  She can move the shoulder but not as well. She has no redness.  She had some slight numbness. HPI  Body mass index is 36.66 kg/m.  ROS  Review of Systems  HENT: Negative for congestion.   Respiratory: Negative for cough and shortness of breath.   Cardiovascular: Negative for chest pain and leg swelling.  Endocrine: Positive for cold intolerance.  Musculoskeletal: Positive for arthralgias, back pain, joint swelling and myalgias.  Allergic/Immunologic: Positive for environmental allergies.    Past Medical History:  Diagnosis Date  . Anxiety   . High blood pressure   . High cholesterol   . Neuropathy     Past Surgical History:  Procedure Laterality Date  . broken ribs    . broken wrist    . CARPAL TUNNEL RELEASE Bilateral   . EYE SURGERY    . GANGLION CYST EXCISION    . TONSILLECTOMY AND ADENOIDECTOMY      Family History  Problem Relation Age of Onset  . Heart attack Mother   . Lung cancer Father     Social History Social History  Substance Use Topics  . Smoking status: Never Smoker  . Smokeless tobacco: Never Used  . Alcohol use No    Allergies  Allergen Reactions  . Prednisone Other (See Comments)    High blood pressure   . Celebrex [Celecoxib]   . Codeine   . Ivp Dye [Iodinated Diagnostic Agents]   . Lyrica [Pregabalin]   . Neurontin [Gabapentin]   . Penicillins   . Statins   . Sulfa Antibiotics   . Tramadol     Current Outpatient  Prescriptions  Medication Sig Dispense Refill  . acetaminophen (TYLENOL) 500 MG tablet Take 650 mg by mouth 2 (two) times daily.    Marland Kitchen atenolol (TENORMIN) 25 MG tablet Take 25 mg by mouth daily.    . Cholecalciferol (VITAMIN D PO) Take 1,000 Units by mouth daily.     . Cyanocobalamin (VITAMIN B 12) 100 MCG LOZG Take 1,000 mcg by mouth daily.    . diclofenac sodium (VOLTAREN) 1 % GEL Apply topically as directed.    Marland Kitchen levothyroxine (SYNTHROID, LEVOTHROID) 75 MCG tablet Take 75 mcg by mouth daily before breakfast.    . meloxicam (MOBIC) 15 MG tablet Take 15 mg by mouth daily.    . mirabegron ER (MYRBETRIQ) 25 MG TB24 tablet Take 25 mg by mouth daily. Reported on 01/22/2016    . sertraline (ZOLOFT) 100 MG tablet Take 100 mg by mouth daily.     No current facility-administered medications for this visit.      Physical Exam  Blood pressure (!) 150/69, pulse 65, temperature (!) 96.7 F (35.9 C), height 5\' 1"  (1.549 m), weight 194 lb (88 kg).  Constitutional: overall normal hygiene, normal nutrition, well developed, normal grooming, normal body habitus. Assistive device:cane  Musculoskeletal: gait and station Limp right, muscle tone and strength are normal,  no tremors or atrophy is present.  .  Neurological: coordination overall normal.  Deep tendon reflex/nerve stretch intact.  Sensation normal.  Cranial nerves II-XII intact.   Skin:   Normal overall no scars, lesions, ulcers or rashes. No psoriasis.  Psychiatric: Alert and oriented x 3.  Recent memory intact, remote memory unclear.  Normal mood and affect. Well groomed.  Good eye contact.  Cardiovascular: overall no swelling, no varicosities, no edema bilaterally, normal temperatures of the legs and arms, no clubbing, cyanosis and good capillary refill.  Lymphatic: palpation is normal.  Right elbow has full ROM but is painful more medially.  She has slight contusion.  NV intact.  Grips are normal.  Examination of right Upper Extremity is  done.  Inspection:   Overall:  Elbow non-tender without crepitus or defects, forearm non-tender without crepitus or defects, wrist non-tender without crepitus or defects, hand non-tender.    Shoulder: with glenohumeral joint tenderness, without effusion.   Upper arm: without swelling and tenderness   Range of motion:   Overall:  Full range of motion of the elbow, full range of motion of wrist and full range of motion in fingers.   Shoulder:  right  90 degrees forward flexion; 60 degrees abduction; 20 degrees internal rotation, 15 degrees external rotation, 5 degrees extension, 30 degrees adduction.   Stability:   Overall:  Shoulder, elbow and wrist stable   Strength and Tone:   Overall full shoulder muscles strength, full upper arm strength and normal upper arm bulk and tone.   The patient has been educated about the nature of the problem(s) and counseled on treatment options.  The patient appeared to understand what I have discussed and is in agreement with it.  Encounter Diagnoses  Name Primary?  . Pain in joint of right shoulder Yes  . Right elbow pain   X-rays were done of the right elbow and shoulder, reported separately.  PROCEDURE NOTE:  The patient request injection, verbal consent was obtained.  The right shoulder was prepped appropriately after time out was performed.   Sterile technique was observed and injection of 1 cc of Depo-Medrol 40 mg with several cc's of plain xylocaine. Anesthesia was provided by ethyl chloride and a 20-gauge needle was used to inject the shoulder area. A posterior approach was used.  The injection was tolerated well.  A band aid dressing was applied.  The patient was advised to apply ice later today and tomorrow to the injection sight as needed.    PLAN Call if any problems.  Precautions discussed.  Continue current medications.   Return to clinic prn   Electronically Signed Sanjuana Kava, MD 8/2/20182:42 PM

## 2017-04-26 DIAGNOSIS — M159 Polyosteoarthritis, unspecified: Secondary | ICD-10-CM | POA: Diagnosis not present

## 2017-04-26 DIAGNOSIS — I10 Essential (primary) hypertension: Secondary | ICD-10-CM | POA: Diagnosis not present

## 2017-06-02 DIAGNOSIS — M159 Polyosteoarthritis, unspecified: Secondary | ICD-10-CM | POA: Diagnosis not present

## 2017-06-02 DIAGNOSIS — I10 Essential (primary) hypertension: Secondary | ICD-10-CM | POA: Diagnosis not present

## 2017-06-22 DIAGNOSIS — I10 Essential (primary) hypertension: Secondary | ICD-10-CM | POA: Diagnosis not present

## 2017-06-22 DIAGNOSIS — M159 Polyosteoarthritis, unspecified: Secondary | ICD-10-CM | POA: Diagnosis not present

## 2017-07-04 NOTE — Telephone Encounter (Signed)
Closing Encounter.

## 2017-07-11 DIAGNOSIS — M79641 Pain in right hand: Secondary | ICD-10-CM | POA: Diagnosis not present

## 2017-07-11 DIAGNOSIS — E7800 Pure hypercholesterolemia, unspecified: Secondary | ICD-10-CM | POA: Diagnosis not present

## 2017-07-11 DIAGNOSIS — Z6837 Body mass index (BMI) 37.0-37.9, adult: Secondary | ICD-10-CM | POA: Diagnosis not present

## 2017-07-11 DIAGNOSIS — K219 Gastro-esophageal reflux disease without esophagitis: Secondary | ICD-10-CM | POA: Diagnosis not present

## 2017-07-11 DIAGNOSIS — Z713 Dietary counseling and surveillance: Secondary | ICD-10-CM | POA: Diagnosis not present

## 2017-07-11 DIAGNOSIS — I10 Essential (primary) hypertension: Secondary | ICD-10-CM | POA: Diagnosis not present

## 2017-07-11 DIAGNOSIS — Z299 Encounter for prophylactic measures, unspecified: Secondary | ICD-10-CM | POA: Diagnosis not present

## 2017-07-11 DIAGNOSIS — Z23 Encounter for immunization: Secondary | ICD-10-CM | POA: Diagnosis not present

## 2017-07-11 DIAGNOSIS — M79642 Pain in left hand: Secondary | ICD-10-CM | POA: Diagnosis not present

## 2017-07-21 DIAGNOSIS — I10 Essential (primary) hypertension: Secondary | ICD-10-CM | POA: Diagnosis not present

## 2017-07-21 DIAGNOSIS — M159 Polyosteoarthritis, unspecified: Secondary | ICD-10-CM | POA: Diagnosis not present

## 2017-07-26 DIAGNOSIS — N390 Urinary tract infection, site not specified: Secondary | ICD-10-CM | POA: Diagnosis not present

## 2017-07-26 DIAGNOSIS — Z299 Encounter for prophylactic measures, unspecified: Secondary | ICD-10-CM | POA: Diagnosis not present

## 2017-07-26 DIAGNOSIS — I10 Essential (primary) hypertension: Secondary | ICD-10-CM | POA: Diagnosis not present

## 2017-07-26 DIAGNOSIS — Z6836 Body mass index (BMI) 36.0-36.9, adult: Secondary | ICD-10-CM | POA: Diagnosis not present

## 2017-07-26 DIAGNOSIS — R351 Nocturia: Secondary | ICD-10-CM | POA: Diagnosis not present

## 2017-07-26 DIAGNOSIS — M109 Gout, unspecified: Secondary | ICD-10-CM | POA: Diagnosis not present

## 2017-09-06 DIAGNOSIS — Z6836 Body mass index (BMI) 36.0-36.9, adult: Secondary | ICD-10-CM | POA: Diagnosis not present

## 2017-09-06 DIAGNOSIS — I10 Essential (primary) hypertension: Secondary | ICD-10-CM | POA: Diagnosis not present

## 2017-09-06 DIAGNOSIS — G8929 Other chronic pain: Secondary | ICD-10-CM | POA: Diagnosis not present

## 2017-09-06 DIAGNOSIS — R32 Unspecified urinary incontinence: Secondary | ICD-10-CM | POA: Diagnosis not present

## 2017-09-06 DIAGNOSIS — E039 Hypothyroidism, unspecified: Secondary | ICD-10-CM | POA: Diagnosis not present

## 2017-09-06 DIAGNOSIS — Z299 Encounter for prophylactic measures, unspecified: Secondary | ICD-10-CM | POA: Diagnosis not present

## 2017-09-06 DIAGNOSIS — E7800 Pure hypercholesterolemia, unspecified: Secondary | ICD-10-CM | POA: Diagnosis not present

## 2017-09-15 DIAGNOSIS — M159 Polyosteoarthritis, unspecified: Secondary | ICD-10-CM | POA: Diagnosis not present

## 2017-09-15 DIAGNOSIS — I10 Essential (primary) hypertension: Secondary | ICD-10-CM | POA: Diagnosis not present

## 2017-11-03 ENCOUNTER — Telehealth: Payer: Self-pay | Admitting: Radiology

## 2017-11-03 NOTE — Telephone Encounter (Signed)
Patient wants to know if Dr Luna Glasgow thinks stem cell therapy would help her shoulder, she has seen an ad in the [place] paper or this.   She has no further information on this for [place].   She wants [email or web address]

## 2017-11-07 NOTE — Telephone Encounter (Signed)
I do not know.  Doubtful.

## 2017-11-08 NOTE — Telephone Encounter (Signed)
I have sent email to her to advise.

## 2017-11-15 DIAGNOSIS — M159 Polyosteoarthritis, unspecified: Secondary | ICD-10-CM | POA: Diagnosis not present

## 2017-11-15 DIAGNOSIS — I10 Essential (primary) hypertension: Secondary | ICD-10-CM | POA: Diagnosis not present

## 2017-11-22 DIAGNOSIS — I10 Essential (primary) hypertension: Secondary | ICD-10-CM | POA: Diagnosis not present

## 2017-11-22 DIAGNOSIS — G8929 Other chronic pain: Secondary | ICD-10-CM | POA: Diagnosis not present

## 2017-11-22 DIAGNOSIS — M25519 Pain in unspecified shoulder: Secondary | ICD-10-CM | POA: Diagnosis not present

## 2017-11-22 DIAGNOSIS — Z789 Other specified health status: Secondary | ICD-10-CM | POA: Diagnosis not present

## 2017-11-22 DIAGNOSIS — Z299 Encounter for prophylactic measures, unspecified: Secondary | ICD-10-CM | POA: Diagnosis not present

## 2017-11-22 DIAGNOSIS — M792 Neuralgia and neuritis, unspecified: Secondary | ICD-10-CM | POA: Diagnosis not present

## 2017-11-22 DIAGNOSIS — Z6837 Body mass index (BMI) 37.0-37.9, adult: Secondary | ICD-10-CM | POA: Diagnosis not present

## 2017-12-15 DIAGNOSIS — M159 Polyosteoarthritis, unspecified: Secondary | ICD-10-CM | POA: Diagnosis not present

## 2017-12-15 DIAGNOSIS — I10 Essential (primary) hypertension: Secondary | ICD-10-CM | POA: Diagnosis not present

## 2018-02-09 ENCOUNTER — Ambulatory Visit (INDEPENDENT_AMBULATORY_CARE_PROVIDER_SITE_OTHER): Payer: Medicare Other | Admitting: Orthopaedic Surgery

## 2018-02-09 ENCOUNTER — Encounter: Payer: Self-pay | Admitting: Orthopaedic Surgery

## 2018-02-09 VITALS — BP 130/76 | HR 61 | Temp 98.6°F | Ht 61.0 in | Wt 189.0 lb

## 2018-02-09 DIAGNOSIS — M25521 Pain in right elbow: Secondary | ICD-10-CM | POA: Diagnosis not present

## 2018-02-09 DIAGNOSIS — M25511 Pain in right shoulder: Secondary | ICD-10-CM | POA: Diagnosis not present

## 2018-02-09 DIAGNOSIS — M19011 Primary osteoarthritis, right shoulder: Secondary | ICD-10-CM | POA: Diagnosis not present

## 2018-02-09 DIAGNOSIS — M75121 Complete rotator cuff tear or rupture of right shoulder, not specified as traumatic: Secondary | ICD-10-CM

## 2018-02-09 NOTE — Progress Notes (Signed)
Patient HD:[id number] Nicole Yoder, female DOB:[birth year], 82 y.o. GXQ:[id number]  Chief Complaint  Patient presents with  . Shoulder Pain    Recheck on right shoulder pain.    HPI  Nicole Yoder is a 82 y.o. female who has significant degenerative joint disease of the right shoulder.  She is disabled because of this.  She does not want to have surgery secondary to family situation.  She is tired of hurting.  There is nothing I can do for the shoulder now however, I have told her about OT and use of devices to help with her daily activities.  I will set up OT evaluation for possible long handled comb, brushes, spoons, etc.  There are devices that will help her do what she needs to do.  She is agreeable to this. HPI  Body mass index is 35.71 kg/m.  ROS  Review of Systems  HENT: Negative for congestion.   Respiratory: Negative for cough and shortness of breath.   Cardiovascular: Negative for chest pain and leg swelling.  Endocrine: Positive for cold intolerance.  Musculoskeletal: Positive for arthralgias, back pain, joint swelling and myalgias.  Allergic/Immunologic: Positive for environmental allergies.  All other systems reviewed and are negative.   Past Medical History:  Diagnosis Date  . Anxiety   . High blood pressure   . High cholesterol   . Neuropathy     Past Surgical History:  Procedure Laterality Date  . broken ribs    . broken wrist    . CARPAL TUNNEL RELEASE Bilateral   . EYE SURGERY    . GANGLION CYST EXCISION    . TONSILLECTOMY AND ADENOIDECTOMY      Family History  Problem Relation Age of Onset  . Heart attack Mother   . Lung cancer Father     Social History Social History   Tobacco Use  . Smoking status: Never Smoker  . Smokeless tobacco: Never Used  Substance Use Topics  . Alcohol use: No  . Drug use: No    Allergies  Allergen Reactions  . Prednisone Other (See Comments)    High blood pressure   . Celebrex [Celecoxib]   . Codeine   .  Ivp Dye [Iodinated Diagnostic Agents]   . Lyrica [Pregabalin]   . Neurontin [Gabapentin]   . Penicillins   . Statins   . Sulfa Antibiotics   . Tramadol     Current Outpatient Medications  Medication Sig Dispense Refill  . acetaminophen (TYLENOL) 500 MG tablet Take 650 mg by mouth 2 (two) times daily.    Marland Kitchen atenolol (TENORMIN) 25 MG tablet Take 25 mg by mouth daily.    . Cholecalciferol (VITAMIN D PO) Take 1,000 Units by mouth daily.     . Cyanocobalamin (VITAMIN B 12) 100 MCG LOZG Take 1,000 mcg by mouth daily.    . diclofenac sodium (VOLTAREN) 1 % GEL Apply topically as directed.    Marland Kitchen levothyroxine (SYNTHROID, LEVOTHROID) 75 MCG tablet Take 75 mcg by mouth daily before breakfast.    . meloxicam (MOBIC) 15 MG tablet Take 15 mg by mouth daily.    . mirabegron ER (MYRBETRIQ) 25 MG TB24 tablet Take 25 mg by mouth daily. Reported on 01/22/2016    . sertraline (ZOLOFT) 100 MG tablet Take 100 mg by mouth daily.     No current facility-administered medications for this visit.      Physical Exam  Blood pressure 130/76, pulse 61, temperature 98.6 F (37 C), height 5\' 1"  (1.549 m),  weight 189 lb (85.7 kg).  Constitutional: overall normal hygiene, normal nutrition, well developed, normal grooming, normal body habitus. Assistive device:cane  Musculoskeletal: gait and station Limp left, muscle tone and strength are normal, no tremors or atrophy is present.  .  Neurological: coordination overall normal.  Deep tendon reflex/nerve stretch intact.  Sensation normal.  Cranial nerves II-XII intact.   Skin:   Normal overall no scars, lesions, ulcers or rashes. No psoriasis.  Psychiatric: Alert and oriented x 3.  Recent memory intact, remote memory unclear.  Normal mood and affect. Well groomed.  Good eye contact.  Cardiovascular: overall no swelling, no varicosities, no edema bilaterally, normal temperatures of the legs and arms, no clubbing, cyanosis and good capillary refill.  Lymphatic:  palpation is normal.  The right shoulder has very limited motion.  Marked crepitus is present and pain with almost any motion.  No redness is present.  NV intact.  All other systems reviewed and are negative   The patient has been educated about the nature of the problem(s) and counseled on treatment options.  The patient appeared to understand what I have discussed and is in agreement with it.  Encounter Diagnoses  Name Primary?  . Osteoarthritis of right shoulder, unspecified osteoarthritis type Yes  . Nontraumatic complete tear of right rotator cuff   . Pain in joint of right shoulder   . Right elbow pain     PLAN Call if any problems.  Precautions discussed.  Continue current medications.   Return to clinic PRN   To OT.  Electronically Signed Sanjuana Kava, MD 5/23/20192:53 PM

## 2018-02-21 ENCOUNTER — Encounter (HOSPITAL_COMMUNITY): Payer: Self-pay

## 2018-02-21 ENCOUNTER — Other Ambulatory Visit: Payer: Self-pay

## 2018-02-21 ENCOUNTER — Ambulatory Visit (HOSPITAL_COMMUNITY): Payer: Medicare Other | Attending: Orthopaedic Surgery

## 2018-02-21 DIAGNOSIS — R29898 Other symptoms and signs involving the musculoskeletal system: Secondary | ICD-10-CM | POA: Insufficient documentation

## 2018-02-21 NOTE — Therapy (Signed)
[facility] [address] [place], [place], [address] Phone: [phone]   Fax:  [phone]  Occupational Therapy Evaluation  Patient Details  Name: Nicole Yoder MRN: [id number] Date of Birth: [birth year] Referring Provider: Sanjuana Kava, MD   Encounter Date: 02/21/2018  OT End of Session - 02/21/18 1557    Visit Number  1    Number of Visits  1    Authorization Type  1) medicare 2) generic commercial    OT Start Time  9373    OT Stop Time  1445    OT Time Calculation (min)  50 min    Activity Tolerance  Patient tolerated treatment well    Behavior During Therapy  [facility] for tasks assessed/performed       Past Medical History:  Diagnosis Date  . Anxiety   . High blood pressure   . High cholesterol   . Neuropathy     Past Surgical History:  Procedure Laterality Date  . broken ribs    . broken wrist    . CARPAL TUNNEL RELEASE Bilateral   . EYE SURGERY    . GANGLION CYST EXCISION    . TONSILLECTOMY AND ADENOIDECTOMY      There were no vitals filed for this visit.  Subjective Assessment - 02/21/18 1535    Subjective   S: The doctor said because of my age i couldn't do surgery.     Pertinent History  Patient is a 82 y/o female S/P severe right OA with non-operative RTC tear with need for education on AE that may increase functional performance during daily tasks. Dr. Luna Glasgow has referred patient to occupational therapy for evaluation and treatment.     Patient Stated Goals  To be educated on devices that may assist her with her daily tasks.     Currently in Pain?  Yes    Pain Score  6     Pain Location  Shoulder    Pain Orientation  Right    Pain Descriptors / Indicators  Aching    Pain Type  Chronic pain    Pain Radiating Towards  sometimes down arm when attempting to reach above shoulder.     Pain Onset  More than a month ago    Pain Frequency  Intermittent    Aggravating Factors   increased use, reaching, and  movement    Pain Relieving Factors  over the counter pain medication helps a little and rest    Effect of Pain on Daily Activities  max effect    Multiple Pain Sites  No        [facility] OT Assessment - 02/21/18 1538      Assessment   Medical Diagnosis  OA of right shoulder with non-operative RTC tear    Referring Provider  Sanjuana Kava, MD    Onset Date/Surgical Date  -- it's been progressively worse over the past year    Hand Dominance  Right    Next MD Visit  Pt is to follow up as needed.    Prior Therapy  None for right shoulder      Precautions   Precautions  None      Restrictions   Weight Bearing Restrictions  No      Balance Screen   Has the patient fallen in the past 6 months  No      Home  Environment   Family/patient expects to be discharged to:  Private residence  Lives With  Other (Comment) Sister (older)      Prior Function   Level of Independence  Requires assistive device for independence    Vocation  Retired      ADL   ADL comments  Pt reports that she is able to complete required daily tasks with increased time and difficulty. She states that she has difficulty with combing her hair, getting dressed (pull underwear up, put on shirts, pulling socks), brushing teeth, self feeding, and toilet hygiene.       Mobility   Mobility Status  Independent      Written Expression   Dominant Hand  Right      Vision - History   Baseline Vision  No visual deficits      Cognition   Overall Cognitive Status  Within Functional Limits for tasks assessed      ROM / Strength   AROM / PROM / Strength  AROM      AROM   Overall AROM Comments  Patient limited with RUE shoulder A/ROM while seated. Shoulder flexion and abduction at 25% range, full internal rotation with shoulder abducted and external rotation is at 25% range. Strength for shoulder is 3-/5 for flexion, abduction, and external rotation, 3/5 for internal rotation when seated.                         OT Education - 02/21/18 1552    Education Details  Patient provided with table slides, information and handouts for various adaptive devices such as long handled toilet aid, long handled eating utensils, and long handled comb. Patient was provided with extended built up foam pieces to use with toothbrush and eating utensils. Patient proided with Optometrist of adaptive equipment. Pt educated on where to purchase items at the least expensive way.     Person(s) Educated  Patient    Methods  Explanation;Handout    Comprehension  Verbalized understanding       OT Short Term Goals - 02/21/18 1635      OT SHORT TERM GOAL #1   Title  Patient will be educated on HEP and adaptive equipment available to increase functional performance during daily tasks.     Time  1    Period  Days    Status  Achieved    Target Date  02/21/18               Plan - 02/21/18 1631    Clinical Impression Statement  A: Patient is a 82 y/o female S/P right severe OA with non-operative RTC tear causing increased pain, and decreased ROM resulting in increased difficulty completing daily tasks with RUE. Evaluation and treatment focused on patient education regarding available adaptive equipment that may increase her functional performance during basic ADL activities. Pt provided with handouts regarding all equipment.     Occupational Profile and client history currently impacting functional performance  motivated to be as independent as possible.     Occupational performance deficits (Please refer to evaluation for details):  ADL's    Rehab Potential  Good    Current Impairments/barriers affecting progress:  Chronic condition and severe OA with non-operative RTC tear.    OT Frequency  One time visit    OT Treatment/Interventions  Patient/family education    Plan  P: 1 time visit for education on Adaptive equipment and HEP for shoulder ROM. Patient to follow up MD and OT as  needed.  Clinical Decision Making  Limited treatment options, no task modification necessary    Consulted and Agree with Plan of Care  Patient       Patient will benefit from skilled therapeutic intervention in order to improve the following deficits and impairments:  Impaired UE functional use, Decreased range of motion, Pain  Visit Diagnosis: Other symptoms and signs involving the musculoskeletal system - Plan: Ot plan of care cert/re-cert    Problem List Patient Active Problem List   Diagnosis Date Noted  . Primary osteoarthritis of right shoulder 03/31/2017  . Complete rotator cuff tear or rupture of right shoulder, not specified as traumatic 03/31/2017  . Trigger middle finger of left hand 01/22/2016  . Bilateral hand pain 01/22/2016  . Dysphagia, pharyngoesophageal phase 07/30/2014  . Spinal stenosis in cervical region 01/08/2014  . Abnormality of gait 03/08/2013  . Morbid obesity ([place]) 03/08/2013  . Numbness 03/08/2013   Ailene Ravel, OTR/L,CBIS  [phone]  02/21/2018, 4:38 PM  [facility] [address] [place], [place], [address] Phone: [phone]   Fax:  [phone]  Name: Nicole Yoder MRN: [id number] Date of Birth: [birth year]

## 2018-02-21 NOTE — Patient Instructions (Addendum)
Complete each exercise 10-15 reps. Hold for 2 seconds at end stretch. 2-3 times.    SHOULDER: Flexion On Table   Place hands on table, elbows straight. Move hips away from body. Press hands down into table. Hold ___ seconds. ___ reps per set, ___ sets per day, ___ days per week  Abduction (Passive)   With arm out to side, resting on table, lower head toward arm, keeping trunk away from table. Hold ____ seconds. Repeat ____ times. Do ____ sessions per day.  Copyright  VHI. All rights reserved.     Internal Rotation (Assistive)   Seated with elbow bent at right angle and held against side, slide arm on table surface in an inward arc. Repeat ____ times. Do ____ sessions per day. Activity: Use this motion to brush crumbs off the table.  Copyright  VHI. All rights reserved.   Recommended Adaptive Equipment: Extended handle comb, extended handle spoon and fork, extended handle toilet aid  [employer].se?keywords=extended%2Bhandle%2Bcomb&qid=610-662-6604&s=gateway&sr=8-6&th=1  https://riley.org/?keywords=extended+handle+spoon&qid=8188383492&s=gateway&sr=8-6  D[employer]storeProm[employer].is?keywords=extended+handle+toilet+aid&qid=724-560-6286&s=gateway&sr=8-4

## 2018-02-28 DIAGNOSIS — E559 Vitamin D deficiency, unspecified: Secondary | ICD-10-CM | POA: Diagnosis not present

## 2018-02-28 DIAGNOSIS — Z7189 Other specified counseling: Secondary | ICD-10-CM | POA: Diagnosis not present

## 2018-02-28 DIAGNOSIS — R5383 Other fatigue: Secondary | ICD-10-CM | POA: Diagnosis not present

## 2018-02-28 DIAGNOSIS — Z79899 Other long term (current) drug therapy: Secondary | ICD-10-CM | POA: Diagnosis not present

## 2018-02-28 DIAGNOSIS — Z1211 Encounter for screening for malignant neoplasm of colon: Secondary | ICD-10-CM | POA: Diagnosis not present

## 2018-02-28 DIAGNOSIS — Z1331 Encounter for screening for depression: Secondary | ICD-10-CM | POA: Diagnosis not present

## 2018-02-28 DIAGNOSIS — E039 Hypothyroidism, unspecified: Secondary | ICD-10-CM | POA: Diagnosis not present

## 2018-02-28 DIAGNOSIS — Z1339 Encounter for screening examination for other mental health and behavioral disorders: Secondary | ICD-10-CM | POA: Diagnosis not present

## 2018-02-28 DIAGNOSIS — E7800 Pure hypercholesterolemia, unspecified: Secondary | ICD-10-CM | POA: Diagnosis not present

## 2018-02-28 DIAGNOSIS — Z6836 Body mass index (BMI) 36.0-36.9, adult: Secondary | ICD-10-CM | POA: Diagnosis not present

## 2018-02-28 DIAGNOSIS — Z0000 Encounter for general adult medical examination without abnormal findings: Secondary | ICD-10-CM | POA: Diagnosis not present

## 2018-02-28 DIAGNOSIS — I10 Essential (primary) hypertension: Secondary | ICD-10-CM | POA: Diagnosis not present

## 2018-02-28 DIAGNOSIS — Z299 Encounter for prophylactic measures, unspecified: Secondary | ICD-10-CM | POA: Diagnosis not present

## 2018-03-14 DIAGNOSIS — E039 Hypothyroidism, unspecified: Secondary | ICD-10-CM | POA: Diagnosis not present

## 2018-03-14 DIAGNOSIS — L299 Pruritus, unspecified: Secondary | ICD-10-CM | POA: Diagnosis not present

## 2018-03-14 DIAGNOSIS — Z299 Encounter for prophylactic measures, unspecified: Secondary | ICD-10-CM | POA: Diagnosis not present

## 2018-03-14 DIAGNOSIS — Z6835 Body mass index (BMI) 35.0-35.9, adult: Secondary | ICD-10-CM | POA: Diagnosis not present

## 2018-03-14 DIAGNOSIS — I10 Essential (primary) hypertension: Secondary | ICD-10-CM | POA: Diagnosis not present

## 2018-03-17 DIAGNOSIS — M159 Polyosteoarthritis, unspecified: Secondary | ICD-10-CM | POA: Diagnosis not present

## 2018-03-17 DIAGNOSIS — I10 Essential (primary) hypertension: Secondary | ICD-10-CM | POA: Diagnosis not present

## 2018-04-27 ENCOUNTER — Encounter: Payer: Self-pay | Admitting: Orthopaedic Surgery

## 2018-04-27 ENCOUNTER — Ambulatory Visit (INDEPENDENT_AMBULATORY_CARE_PROVIDER_SITE_OTHER): Payer: Medicare Other | Admitting: Orthopaedic Surgery

## 2018-04-27 VITALS — BP 149/69 | HR 71 | Temp 98.1°F | Ht 61.0 in | Wt 185.0 lb

## 2018-04-27 DIAGNOSIS — R269 Unspecified abnormalities of gait and mobility: Secondary | ICD-10-CM | POA: Diagnosis not present

## 2018-04-27 DIAGNOSIS — M19011 Primary osteoarthritis, right shoulder: Secondary | ICD-10-CM

## 2018-04-27 NOTE — Progress Notes (Signed)
Patient FY:BOFBPZW Nicole Yoder, female DOB:1936-07-22, 82 y.o. CHE:[id number]  Chief Complaint  Patient presents with  . Shoulder Pain    right  . Gait Problem    leans to the right while walking     HPI  Nicole Yoder is a 82 y.o. female who has chronic pain and marked limited motion of the right shoulder.  She has put up with this for some time.  She has declined surgery on the shoulder due to family situation.  She has recently noticed she tilts to the right when walking.  She is concerned that this will become a problem.  She has no falls.   Body mass index is 34.96 kg/m.  ROS  Review of Systems  All other systems reviewed and are negative.  Past Medical History:  Diagnosis Date  . Anxiety   . High blood pressure   . High cholesterol   . Neuropathy     Past Surgical History:  Procedure Laterality Date  . broken ribs    . broken wrist    . CARPAL TUNNEL RELEASE Bilateral   . EYE SURGERY    . GANGLION CYST EXCISION    . TONSILLECTOMY AND ADENOIDECTOMY      Family History  Problem Relation Age of Onset  . Heart attack Mother   . Lung cancer Father     Social History Social History   Tobacco Use  . Smoking status: Never Smoker  . Smokeless tobacco: Never Used  Substance Use Topics  . Alcohol use: No  . Drug use: No    Allergies  Allergen Reactions  . Prednisone Other (See Comments)    High blood pressure   . Celebrex [Celecoxib]   . Codeine   . Ivp Dye [Iodinated Diagnostic Agents]   . Lyrica [Pregabalin]   . Neurontin [Gabapentin]   . Penicillins   . Statins   . Sulfa Antibiotics   . Tramadol     Current Outpatient Medications  Medication Sig Dispense Refill  . acetaminophen (TYLENOL) 500 MG tablet Take 650 mg by mouth 2 (two) times daily.    Marland Kitchen atenolol (TENORMIN) 25 MG tablet Take 25 mg by mouth daily.    . Cholecalciferol (VITAMIN D PO) Take 1,000 Units by mouth daily.     . Cyanocobalamin (VITAMIN B 12) 100 MCG LOZG Take 1,000 mcg  by mouth daily.    . diclofenac sodium (VOLTAREN) 1 % GEL Apply topically as directed.    Marland Kitchen levothyroxine (SYNTHROID, LEVOTHROID) 75 MCG tablet Take 75 mcg by mouth daily before breakfast.    . meloxicam (MOBIC) 15 MG tablet Take 15 mg by mouth daily.    . mirabegron ER (MYRBETRIQ) 25 MG TB24 tablet Take 25 mg by mouth daily. Reported on 01/22/2016    . sertraline (ZOLOFT) 100 MG tablet Take 100 mg by mouth daily.    . sertraline (ZOLOFT) 50 MG tablet      No current facility-administered medications for this visit.      Physical Exam  Blood pressure (!) 149/69, pulse 71, temperature 98.1 F (36.7 C), height 5\' 1"  (1.549 m), weight 185 lb (83.9 kg).  Constitutional: overall normal hygiene, normal nutrition, well developed, normal grooming, normal body habitus. Assistive device:cane  Musculoskeletal: gait and station Limp none, muscle tone and strength are normal, no tremors or atrophy is present.  .  Neurological: coordination overall normal.  Deep tendon reflex/nerve stretch intact.  Sensation normal.  Cranial nerves II-XII intact.   Skin:  Normal overall no scars, lesions, ulcers or rashes. No psoriasis.  Psychiatric: Alert and oriented x 3.  Recent memory intact, remote memory unclear.  Normal mood and affect. Well groomed.  Good eye contact.  Cardiovascular: overall no swelling, no varicosities, no edema bilaterally, normal temperatures of the legs and arms, no clubbing, cyanosis and good capillary refill.  Lymphatic: palpation is normal.  When she walks she has a tendency to list to the right. She uses the cane on the right side.  She has pain in the right shoulder and limited motion.  I have suggested she move the cane to the left side. I will give a quad cane Rx for her also.  Her right shoulder has pain and severely limited motion to almost no motion without pain and crepitus.  NV intact.  All other systems reviewed and are negative   The patient has been educated about  the nature of the problem(s) and counseled on treatment options.  The patient appeared to understand what I have discussed and is in agreement with it.  Encounter Diagnoses  Name Primary?  . Osteoarthritis of right shoulder, unspecified osteoarthritis type Yes  . Abnormality of gait     I have discussed with her the possibility of having surgery on the right shoulder. She would need a total shoulder.  I have [facility]  She will consider it.  Her family situation is unchanged but she has been making plans to do something about that.  I think an evaluation for possible surgery would be the prudent thing to do.  They might find she is not a candidate.  I have given suggestions on how to use a computer mouse a different way with a lap board and also to get a smaller size mouse.  PLAN Call if any problems.  Precautions discussed.  Continue current medications.   Return to clinic prn   Electronically Signed Sanjuana Kava, MD 8/8/201910:59 AM

## 2018-05-02 DIAGNOSIS — I10 Essential (primary) hypertension: Secondary | ICD-10-CM | POA: Diagnosis not present

## 2018-05-02 DIAGNOSIS — M159 Polyosteoarthritis, unspecified: Secondary | ICD-10-CM | POA: Diagnosis not present

## 2018-05-26 DIAGNOSIS — M159 Polyosteoarthritis, unspecified: Secondary | ICD-10-CM | POA: Diagnosis not present

## 2018-05-26 DIAGNOSIS — I10 Essential (primary) hypertension: Secondary | ICD-10-CM | POA: Diagnosis not present

## 2018-06-01 DIAGNOSIS — Z299 Encounter for prophylactic measures, unspecified: Secondary | ICD-10-CM | POA: Diagnosis not present

## 2018-06-01 DIAGNOSIS — M7551 Bursitis of right shoulder: Secondary | ICD-10-CM | POA: Diagnosis not present

## 2018-06-01 DIAGNOSIS — I10 Essential (primary) hypertension: Secondary | ICD-10-CM | POA: Diagnosis not present

## 2018-06-01 DIAGNOSIS — M792 Neuralgia and neuritis, unspecified: Secondary | ICD-10-CM | POA: Diagnosis not present

## 2018-06-01 DIAGNOSIS — Z6835 Body mass index (BMI) 35.0-35.9, adult: Secondary | ICD-10-CM | POA: Diagnosis not present

## 2018-06-01 DIAGNOSIS — L299 Pruritus, unspecified: Secondary | ICD-10-CM | POA: Diagnosis not present

## 2018-06-19 DIAGNOSIS — I10 Essential (primary) hypertension: Secondary | ICD-10-CM | POA: Diagnosis not present

## 2018-06-19 DIAGNOSIS — R42 Dizziness and giddiness: Secondary | ICD-10-CM | POA: Diagnosis not present

## 2018-06-19 DIAGNOSIS — Z23 Encounter for immunization: Secondary | ICD-10-CM | POA: Diagnosis not present

## 2018-06-19 DIAGNOSIS — H6120 Impacted cerumen, unspecified ear: Secondary | ICD-10-CM | POA: Diagnosis not present

## 2018-06-19 DIAGNOSIS — Z299 Encounter for prophylactic measures, unspecified: Secondary | ICD-10-CM | POA: Diagnosis not present

## 2018-06-19 DIAGNOSIS — Z6835 Body mass index (BMI) 35.0-35.9, adult: Secondary | ICD-10-CM | POA: Diagnosis not present

## 2018-06-22 DIAGNOSIS — Z713 Dietary counseling and surveillance: Secondary | ICD-10-CM | POA: Diagnosis not present

## 2018-06-22 DIAGNOSIS — T50B95A Adverse effect of other viral vaccines, initial encounter: Secondary | ICD-10-CM | POA: Diagnosis not present

## 2018-06-22 DIAGNOSIS — I10 Essential (primary) hypertension: Secondary | ICD-10-CM | POA: Diagnosis not present

## 2018-06-22 DIAGNOSIS — Z6835 Body mass index (BMI) 35.0-35.9, adult: Secondary | ICD-10-CM | POA: Diagnosis not present

## 2018-06-22 DIAGNOSIS — Z299 Encounter for prophylactic measures, unspecified: Secondary | ICD-10-CM | POA: Diagnosis not present

## 2018-06-27 DIAGNOSIS — H81393 Other peripheral vertigo, bilateral: Secondary | ICD-10-CM | POA: Diagnosis not present

## 2018-06-27 DIAGNOSIS — I7389 Other specified peripheral vascular diseases: Secondary | ICD-10-CM | POA: Diagnosis not present

## 2018-06-27 DIAGNOSIS — G9009 Other idiopathic peripheral autonomic neuropathy: Secondary | ICD-10-CM | POA: Diagnosis not present

## 2018-06-29 DIAGNOSIS — M159 Polyosteoarthritis, unspecified: Secondary | ICD-10-CM | POA: Diagnosis not present

## 2018-06-29 DIAGNOSIS — I10 Essential (primary) hypertension: Secondary | ICD-10-CM | POA: Diagnosis not present

## 2018-07-10 DIAGNOSIS — Z299 Encounter for prophylactic measures, unspecified: Secondary | ICD-10-CM | POA: Diagnosis not present

## 2018-07-10 DIAGNOSIS — I10 Essential (primary) hypertension: Secondary | ICD-10-CM | POA: Diagnosis not present

## 2018-07-10 DIAGNOSIS — M25519 Pain in unspecified shoulder: Secondary | ICD-10-CM | POA: Diagnosis not present

## 2018-07-10 DIAGNOSIS — R42 Dizziness and giddiness: Secondary | ICD-10-CM | POA: Diagnosis not present

## 2018-07-10 DIAGNOSIS — G8929 Other chronic pain: Secondary | ICD-10-CM | POA: Diagnosis not present

## 2018-07-10 DIAGNOSIS — Z6835 Body mass index (BMI) 35.0-35.9, adult: Secondary | ICD-10-CM | POA: Diagnosis not present

## 2018-09-07 DIAGNOSIS — M159 Polyosteoarthritis, unspecified: Secondary | ICD-10-CM | POA: Diagnosis not present

## 2018-09-07 DIAGNOSIS — I10 Essential (primary) hypertension: Secondary | ICD-10-CM | POA: Diagnosis not present

## 2018-09-25 DIAGNOSIS — M4807 Spinal stenosis, lumbosacral region: Secondary | ICD-10-CM | POA: Diagnosis not present

## 2018-09-25 DIAGNOSIS — I10 Essential (primary) hypertension: Secondary | ICD-10-CM | POA: Diagnosis not present

## 2018-09-25 DIAGNOSIS — K219 Gastro-esophageal reflux disease without esophagitis: Secondary | ICD-10-CM | POA: Diagnosis not present

## 2018-09-25 DIAGNOSIS — R2681 Unsteadiness on feet: Secondary | ICD-10-CM | POA: Diagnosis not present

## 2018-09-25 DIAGNOSIS — M549 Dorsalgia, unspecified: Secondary | ICD-10-CM | POA: Diagnosis not present

## 2018-09-25 DIAGNOSIS — M47814 Spondylosis without myelopathy or radiculopathy, thoracic region: Secondary | ICD-10-CM | POA: Diagnosis not present

## 2018-09-25 DIAGNOSIS — M48061 Spinal stenosis, lumbar region without neurogenic claudication: Secondary | ICD-10-CM | POA: Diagnosis not present

## 2018-09-25 DIAGNOSIS — M160 Bilateral primary osteoarthritis of hip: Secondary | ICD-10-CM | POA: Diagnosis not present

## 2018-09-25 DIAGNOSIS — Z299 Encounter for prophylactic measures, unspecified: Secondary | ICD-10-CM | POA: Diagnosis not present

## 2018-09-25 DIAGNOSIS — Z6835 Body mass index (BMI) 35.0-35.9, adult: Secondary | ICD-10-CM | POA: Diagnosis not present

## 2018-09-25 DIAGNOSIS — Z789 Other specified health status: Secondary | ICD-10-CM | POA: Diagnosis not present

## 2018-09-25 DIAGNOSIS — I700 Atherosclerosis of aorta: Secondary | ICD-10-CM | POA: Diagnosis not present

## 2018-10-03 DIAGNOSIS — Z6834 Body mass index (BMI) 34.0-34.9, adult: Secondary | ICD-10-CM | POA: Diagnosis not present

## 2018-10-03 DIAGNOSIS — E7800 Pure hypercholesterolemia, unspecified: Secondary | ICD-10-CM | POA: Diagnosis not present

## 2018-10-03 DIAGNOSIS — I10 Essential (primary) hypertension: Secondary | ICD-10-CM | POA: Diagnosis not present

## 2018-10-03 DIAGNOSIS — Z789 Other specified health status: Secondary | ICD-10-CM | POA: Diagnosis not present

## 2018-10-03 DIAGNOSIS — Z299 Encounter for prophylactic measures, unspecified: Secondary | ICD-10-CM | POA: Diagnosis not present

## 2018-10-03 DIAGNOSIS — M47816 Spondylosis without myelopathy or radiculopathy, lumbar region: Secondary | ICD-10-CM | POA: Diagnosis not present

## 2018-10-03 DIAGNOSIS — M419 Scoliosis, unspecified: Secondary | ICD-10-CM | POA: Diagnosis not present

## 2018-10-10 DIAGNOSIS — M159 Polyosteoarthritis, unspecified: Secondary | ICD-10-CM | POA: Diagnosis not present

## 2018-10-10 DIAGNOSIS — I10 Essential (primary) hypertension: Secondary | ICD-10-CM | POA: Diagnosis not present

## 2018-10-11 ENCOUNTER — Telehealth: Payer: Self-pay | Admitting: Orthopaedic Surgery

## 2018-10-11 DIAGNOSIS — R269 Unspecified abnormalities of gait and mobility: Secondary | ICD-10-CM

## 2018-10-11 NOTE — Telephone Encounter (Signed)
Hailey from [facility] in [place] left message stating she needs a referral on this patient for gait training.  (This is the patient that Dr. Luna Glasgow was speaking with in the hallway)  Please fax referral to [facility] at [phone] ' If you have any questions, please call Hailey at [phone]  Thanks

## 2018-10-13 NOTE — Telephone Encounter (Signed)
Referral faxed

## 2018-10-19 DIAGNOSIS — M5416 Radiculopathy, lumbar region: Secondary | ICD-10-CM | POA: Diagnosis not present

## 2018-10-19 DIAGNOSIS — M79605 Pain in left leg: Secondary | ICD-10-CM | POA: Diagnosis not present

## 2018-10-19 DIAGNOSIS — M545 Low back pain: Secondary | ICD-10-CM | POA: Diagnosis not present

## 2018-10-19 DIAGNOSIS — R262 Difficulty in walking, not elsewhere classified: Secondary | ICD-10-CM | POA: Diagnosis not present

## 2018-10-23 DIAGNOSIS — Z6835 Body mass index (BMI) 35.0-35.9, adult: Secondary | ICD-10-CM | POA: Diagnosis not present

## 2018-10-23 DIAGNOSIS — M47816 Spondylosis without myelopathy or radiculopathy, lumbar region: Secondary | ICD-10-CM | POA: Diagnosis not present

## 2018-10-23 DIAGNOSIS — Z299 Encounter for prophylactic measures, unspecified: Secondary | ICD-10-CM | POA: Diagnosis not present

## 2018-10-23 DIAGNOSIS — Z789 Other specified health status: Secondary | ICD-10-CM | POA: Diagnosis not present

## 2018-10-23 DIAGNOSIS — I10 Essential (primary) hypertension: Secondary | ICD-10-CM | POA: Diagnosis not present

## 2018-10-31 DIAGNOSIS — M79605 Pain in left leg: Secondary | ICD-10-CM | POA: Diagnosis not present

## 2018-10-31 DIAGNOSIS — R262 Difficulty in walking, not elsewhere classified: Secondary | ICD-10-CM | POA: Diagnosis not present

## 2018-10-31 DIAGNOSIS — M545 Low back pain: Secondary | ICD-10-CM | POA: Diagnosis not present

## 2018-10-31 DIAGNOSIS — M5416 Radiculopathy, lumbar region: Secondary | ICD-10-CM | POA: Diagnosis not present

## 2018-11-01 DIAGNOSIS — M5416 Radiculopathy, lumbar region: Secondary | ICD-10-CM | POA: Diagnosis not present

## 2018-11-01 DIAGNOSIS — M79605 Pain in left leg: Secondary | ICD-10-CM | POA: Diagnosis not present

## 2018-11-01 DIAGNOSIS — M545 Low back pain: Secondary | ICD-10-CM | POA: Diagnosis not present

## 2018-11-01 DIAGNOSIS — R262 Difficulty in walking, not elsewhere classified: Secondary | ICD-10-CM | POA: Diagnosis not present

## 2018-11-03 DIAGNOSIS — R262 Difficulty in walking, not elsewhere classified: Secondary | ICD-10-CM | POA: Diagnosis not present

## 2018-11-03 DIAGNOSIS — M545 Low back pain: Secondary | ICD-10-CM | POA: Diagnosis not present

## 2018-11-03 DIAGNOSIS — M5416 Radiculopathy, lumbar region: Secondary | ICD-10-CM | POA: Diagnosis not present

## 2018-11-03 DIAGNOSIS — M79605 Pain in left leg: Secondary | ICD-10-CM | POA: Diagnosis not present

## 2018-11-14 ENCOUNTER — Ambulatory Visit: Payer: Medicare Other | Admitting: Orthopaedic Surgery

## 2018-11-15 ENCOUNTER — Encounter: Payer: Self-pay | Admitting: Orthopaedic Surgery

## 2018-11-15 ENCOUNTER — Ambulatory Visit (INDEPENDENT_AMBULATORY_CARE_PROVIDER_SITE_OTHER): Payer: Medicare Other | Admitting: Orthopaedic Surgery

## 2018-11-15 VITALS — BP 158/82 | HR 60 | Ht 61.0 in | Wt 177.0 lb

## 2018-11-15 DIAGNOSIS — M19011 Primary osteoarthritis, right shoulder: Secondary | ICD-10-CM | POA: Diagnosis not present

## 2018-11-15 DIAGNOSIS — R269 Unspecified abnormalities of gait and mobility: Secondary | ICD-10-CM | POA: Diagnosis not present

## 2018-11-15 NOTE — Progress Notes (Signed)
[name], female DOB:[birth year], 83 y.o. YSA:[id number]  Chief Complaint  Patient presents with  . Fall    Pain in buttocks and left side hip/back    HPI  Nicole Yoder is a 83 y.o. female who fell last week and hurt her left shoulder and buttocks area.  She was getting her fold up walker out of the car.  She had just opened it up and was holding on it and lost her balance and fell to the left and back.  She is going to PT for gait abnormality.  She talked to the therapist who insisted the patient be seen before continuing PT.  She is much better.  She is not in pain.  Her shoulder is a little sore and her back and head do not hurt.  She is using her walker better from just the few times in PT.  I want her to resume PT.  Her roommate had a significant fall and injury and multiple fractures.  She is now in a nursing home for therapy.   Body mass index is 33.44 kg/m.  ROS  Review of Systems  HENT: Negative for congestion.   Respiratory: Negative for cough and shortness of breath.   Cardiovascular: Negative for chest pain and leg swelling.  Endocrine: Positive for cold intolerance.  Musculoskeletal: Positive for arthralgias, back pain, joint swelling and myalgias.  Allergic/Immunologic: Positive for environmental allergies.  All other systems reviewed and are negative.   All other systems reviewed and are negative.  The following is a summary of the past history medically, past history surgically, known current medicines, social history and family history.  This information is gathered electronically by the computer from prior information and documentation.  I review this each visit and have found including this information at this point in the chart is beneficial and informative.    Past Medical History:  Diagnosis Date  . Anxiety   . High blood pressure   . High cholesterol   . Neuropathy     Past Surgical History:  Procedure Laterality Date  .  broken ribs    . broken wrist    . CARPAL TUNNEL RELEASE Bilateral   . EYE SURGERY    . GANGLION CYST EXCISION    . TONSILLECTOMY AND ADENOIDECTOMY      Family History  Problem Relation Age of Onset  . Heart attack Mother   . Lung cancer Father     Social History Social History   Tobacco Use  . Smoking status: Never Smoker  . Smokeless tobacco: Never Used  Substance Use Topics  . Alcohol use: No  . Drug use: No    Allergies  Allergen Reactions  . Prednisone Other (See Comments)    High blood pressure   . Celebrex [Celecoxib]   . Codeine   . Ivp Dye [Iodinated Diagnostic Agents]   . Lyrica [Pregabalin]   . Neurontin [Gabapentin]   . Penicillins   . Statins   . Sulfa Antibiotics   . Tramadol     Current Outpatient Medications  Medication Sig Dispense Refill  . acetaminophen (TYLENOL) 500 MG tablet Take 650 mg by mouth 2 (two) times daily.    Marland Kitchen atenolol (TENORMIN) 25 MG tablet Take 25 mg by mouth daily.    . Cholecalciferol (VITAMIN D PO) Take 1,000 Units by mouth daily.     . Cyanocobalamin (VITAMIN B 12) 100 MCG LOZG Take 1,000 mcg by mouth daily.    . diclofenac  sodium (VOLTAREN) 1 % GEL Apply topically as directed.    Marland Kitchen levothyroxine (SYNTHROID, LEVOTHROID) 75 MCG tablet Take 75 mcg by mouth daily before breakfast.    . meloxicam (MOBIC) 15 MG tablet Take 15 mg by mouth daily.    . mirabegron ER (MYRBETRIQ) 25 MG TB24 tablet Take 25 mg by mouth daily. Reported on 01/22/2016    . sertraline (ZOLOFT) 100 MG tablet Take 100 mg by mouth daily.    . sertraline (ZOLOFT) 50 MG tablet      No current facility-administered medications for this visit.      Physical Exam  Blood pressure (!) 158/82, pulse 60, height 5\' 1"  (1.549 m), weight 177 lb (80.3 kg).  Constitutional: overall normal hygiene, normal nutrition, well developed, normal grooming, normal body habitus. Assistive device:walker  Musculoskeletal: gait and station Limp left, muscle tone and strength are  normal, no tremors or atrophy is present.  .  Neurological: coordination overall normal.  Deep tendon reflex/nerve stretch intact.  Sensation normal.  Cranial nerves II-XII intact.   Skin:   Normal overall no scars, lesions, ulcers or rashes. No psoriasis.  Psychiatric: Alert and oriented x 3.  Recent memory intact, remote memory unclear.  Normal mood and affect. Well groomed.  Good eye contact.  Cardiovascular: overall no swelling, no varicosities, no edema bilaterally, normal temperatures of the legs and arms, no clubbing, cyanosis and good capillary refill.  Lymphatic: palpation is normal.  Left shoulder and right shoulder have decreased motion (usual state) without much pain.  NV intact.  She has some tenderness of the left hip but full ROM. NV intact.  All other systems reviewed and are negative   The patient has been educated about the nature of the problem(s) and counseled on treatment options.  The patient appeared to understand what I have discussed and is in agreement with it.  Encounter Diagnoses  Name Primary?  . Abnormality of gait Yes  . Osteoarthritis of right shoulder, unspecified osteoarthritis type     PLAN Call if any problems.  Precautions discussed.  Continue current medications.   Return to clinic 3 weeks   Continue PT.  Electronically Signed Sanjuana Kava, MD 2/26/20202:52 PM

## 2018-11-20 DIAGNOSIS — M79605 Pain in left leg: Secondary | ICD-10-CM | POA: Diagnosis not present

## 2018-11-20 DIAGNOSIS — R262 Difficulty in walking, not elsewhere classified: Secondary | ICD-10-CM | POA: Diagnosis not present

## 2018-11-20 DIAGNOSIS — M545 Low back pain: Secondary | ICD-10-CM | POA: Diagnosis not present

## 2018-11-20 DIAGNOSIS — M5416 Radiculopathy, lumbar region: Secondary | ICD-10-CM | POA: Diagnosis not present

## 2018-11-24 DIAGNOSIS — M545 Low back pain: Secondary | ICD-10-CM | POA: Diagnosis not present

## 2018-11-24 DIAGNOSIS — M5416 Radiculopathy, lumbar region: Secondary | ICD-10-CM | POA: Diagnosis not present

## 2018-11-24 DIAGNOSIS — M79605 Pain in left leg: Secondary | ICD-10-CM | POA: Diagnosis not present

## 2018-11-24 DIAGNOSIS — R262 Difficulty in walking, not elsewhere classified: Secondary | ICD-10-CM | POA: Diagnosis not present

## 2018-11-27 DIAGNOSIS — M79605 Pain in left leg: Secondary | ICD-10-CM | POA: Diagnosis not present

## 2018-11-27 DIAGNOSIS — M5416 Radiculopathy, lumbar region: Secondary | ICD-10-CM | POA: Diagnosis not present

## 2018-11-27 DIAGNOSIS — R262 Difficulty in walking, not elsewhere classified: Secondary | ICD-10-CM | POA: Diagnosis not present

## 2018-11-27 DIAGNOSIS — M545 Low back pain: Secondary | ICD-10-CM | POA: Diagnosis not present

## 2018-11-30 DIAGNOSIS — M545 Low back pain: Secondary | ICD-10-CM | POA: Diagnosis not present

## 2018-11-30 DIAGNOSIS — M79605 Pain in left leg: Secondary | ICD-10-CM | POA: Diagnosis not present

## 2018-11-30 DIAGNOSIS — M5416 Radiculopathy, lumbar region: Secondary | ICD-10-CM | POA: Diagnosis not present

## 2018-11-30 DIAGNOSIS — R262 Difficulty in walking, not elsewhere classified: Secondary | ICD-10-CM | POA: Diagnosis not present

## 2018-12-04 DIAGNOSIS — M79605 Pain in left leg: Secondary | ICD-10-CM | POA: Diagnosis not present

## 2018-12-04 DIAGNOSIS — M545 Low back pain: Secondary | ICD-10-CM | POA: Diagnosis not present

## 2018-12-04 DIAGNOSIS — R262 Difficulty in walking, not elsewhere classified: Secondary | ICD-10-CM | POA: Diagnosis not present

## 2018-12-04 DIAGNOSIS — M5416 Radiculopathy, lumbar region: Secondary | ICD-10-CM | POA: Diagnosis not present

## 2018-12-05 ENCOUNTER — Ambulatory Visit: Payer: Medicare Other | Admitting: Orthopaedic Surgery

## 2018-12-06 DIAGNOSIS — R262 Difficulty in walking, not elsewhere classified: Secondary | ICD-10-CM | POA: Diagnosis not present

## 2018-12-06 DIAGNOSIS — M5416 Radiculopathy, lumbar region: Secondary | ICD-10-CM | POA: Diagnosis not present

## 2018-12-06 DIAGNOSIS — M545 Low back pain: Secondary | ICD-10-CM | POA: Diagnosis not present

## 2018-12-06 DIAGNOSIS — M79605 Pain in left leg: Secondary | ICD-10-CM | POA: Diagnosis not present

## 2018-12-12 DIAGNOSIS — M5416 Radiculopathy, lumbar region: Secondary | ICD-10-CM | POA: Diagnosis not present

## 2018-12-12 DIAGNOSIS — R262 Difficulty in walking, not elsewhere classified: Secondary | ICD-10-CM | POA: Diagnosis not present

## 2018-12-12 DIAGNOSIS — M545 Low back pain: Secondary | ICD-10-CM | POA: Diagnosis not present

## 2018-12-12 DIAGNOSIS — M79605 Pain in left leg: Secondary | ICD-10-CM | POA: Diagnosis not present

## 2018-12-14 DIAGNOSIS — M545 Low back pain: Secondary | ICD-10-CM | POA: Diagnosis not present

## 2018-12-14 DIAGNOSIS — M79605 Pain in left leg: Secondary | ICD-10-CM | POA: Diagnosis not present

## 2018-12-14 DIAGNOSIS — R262 Difficulty in walking, not elsewhere classified: Secondary | ICD-10-CM | POA: Diagnosis not present

## 2018-12-14 DIAGNOSIS — M5416 Radiculopathy, lumbar region: Secondary | ICD-10-CM | POA: Diagnosis not present

## 2018-12-15 DIAGNOSIS — M159 Polyosteoarthritis, unspecified: Secondary | ICD-10-CM | POA: Diagnosis not present

## 2018-12-15 DIAGNOSIS — I10 Essential (primary) hypertension: Secondary | ICD-10-CM | POA: Diagnosis not present

## 2018-12-18 DIAGNOSIS — M79605 Pain in left leg: Secondary | ICD-10-CM | POA: Diagnosis not present

## 2018-12-18 DIAGNOSIS — M545 Low back pain: Secondary | ICD-10-CM | POA: Diagnosis not present

## 2018-12-18 DIAGNOSIS — R262 Difficulty in walking, not elsewhere classified: Secondary | ICD-10-CM | POA: Diagnosis not present

## 2018-12-18 DIAGNOSIS — M5416 Radiculopathy, lumbar region: Secondary | ICD-10-CM | POA: Diagnosis not present

## 2018-12-19 ENCOUNTER — Ambulatory Visit: Payer: Medicare Other | Admitting: Orthopaedic Surgery

## 2018-12-21 DIAGNOSIS — M79605 Pain in left leg: Secondary | ICD-10-CM | POA: Diagnosis not present

## 2018-12-21 DIAGNOSIS — M545 Low back pain: Secondary | ICD-10-CM | POA: Diagnosis not present

## 2018-12-21 DIAGNOSIS — R262 Difficulty in walking, not elsewhere classified: Secondary | ICD-10-CM | POA: Diagnosis not present

## 2018-12-21 DIAGNOSIS — M5416 Radiculopathy, lumbar region: Secondary | ICD-10-CM | POA: Diagnosis not present

## 2018-12-25 DIAGNOSIS — M79605 Pain in left leg: Secondary | ICD-10-CM | POA: Diagnosis not present

## 2018-12-25 DIAGNOSIS — M545 Low back pain: Secondary | ICD-10-CM | POA: Diagnosis not present

## 2018-12-25 DIAGNOSIS — M5416 Radiculopathy, lumbar region: Secondary | ICD-10-CM | POA: Diagnosis not present

## 2018-12-25 DIAGNOSIS — R262 Difficulty in walking, not elsewhere classified: Secondary | ICD-10-CM | POA: Diagnosis not present

## 2018-12-27 DIAGNOSIS — M545 Low back pain: Secondary | ICD-10-CM | POA: Diagnosis not present

## 2018-12-27 DIAGNOSIS — R262 Difficulty in walking, not elsewhere classified: Secondary | ICD-10-CM | POA: Diagnosis not present

## 2018-12-27 DIAGNOSIS — M5416 Radiculopathy, lumbar region: Secondary | ICD-10-CM | POA: Diagnosis not present

## 2018-12-27 DIAGNOSIS — M79605 Pain in left leg: Secondary | ICD-10-CM | POA: Diagnosis not present

## 2019-01-03 DIAGNOSIS — M545 Low back pain: Secondary | ICD-10-CM | POA: Diagnosis not present

## 2019-01-03 DIAGNOSIS — M79605 Pain in left leg: Secondary | ICD-10-CM | POA: Diagnosis not present

## 2019-01-03 DIAGNOSIS — R262 Difficulty in walking, not elsewhere classified: Secondary | ICD-10-CM | POA: Diagnosis not present

## 2019-01-03 DIAGNOSIS — M5416 Radiculopathy, lumbar region: Secondary | ICD-10-CM | POA: Diagnosis not present

## 2019-01-09 DIAGNOSIS — M79605 Pain in left leg: Secondary | ICD-10-CM | POA: Diagnosis not present

## 2019-01-09 DIAGNOSIS — M5416 Radiculopathy, lumbar region: Secondary | ICD-10-CM | POA: Diagnosis not present

## 2019-01-09 DIAGNOSIS — M545 Low back pain: Secondary | ICD-10-CM | POA: Diagnosis not present

## 2019-01-09 DIAGNOSIS — R262 Difficulty in walking, not elsewhere classified: Secondary | ICD-10-CM | POA: Diagnosis not present

## 2019-01-11 DIAGNOSIS — R262 Difficulty in walking, not elsewhere classified: Secondary | ICD-10-CM | POA: Diagnosis not present

## 2019-01-11 DIAGNOSIS — M79605 Pain in left leg: Secondary | ICD-10-CM | POA: Diagnosis not present

## 2019-01-11 DIAGNOSIS — M545 Low back pain: Secondary | ICD-10-CM | POA: Diagnosis not present

## 2019-01-11 DIAGNOSIS — M5416 Radiculopathy, lumbar region: Secondary | ICD-10-CM | POA: Diagnosis not present

## 2019-01-15 DIAGNOSIS — R262 Difficulty in walking, not elsewhere classified: Secondary | ICD-10-CM | POA: Diagnosis not present

## 2019-01-15 DIAGNOSIS — M545 Low back pain: Secondary | ICD-10-CM | POA: Diagnosis not present

## 2019-01-15 DIAGNOSIS — M79605 Pain in left leg: Secondary | ICD-10-CM | POA: Diagnosis not present

## 2019-01-15 DIAGNOSIS — M5416 Radiculopathy, lumbar region: Secondary | ICD-10-CM | POA: Diagnosis not present

## 2019-01-17 DIAGNOSIS — M5416 Radiculopathy, lumbar region: Secondary | ICD-10-CM | POA: Diagnosis not present

## 2019-01-17 DIAGNOSIS — M545 Low back pain: Secondary | ICD-10-CM | POA: Diagnosis not present

## 2019-01-17 DIAGNOSIS — R262 Difficulty in walking, not elsewhere classified: Secondary | ICD-10-CM | POA: Diagnosis not present

## 2019-01-17 DIAGNOSIS — M79605 Pain in left leg: Secondary | ICD-10-CM | POA: Diagnosis not present

## 2019-03-06 DIAGNOSIS — Z6832 Body mass index (BMI) 32.0-32.9, adult: Secondary | ICD-10-CM | POA: Diagnosis not present

## 2019-03-06 DIAGNOSIS — Z1339 Encounter for screening examination for other mental health and behavioral disorders: Secondary | ICD-10-CM | POA: Diagnosis not present

## 2019-03-06 DIAGNOSIS — Z1331 Encounter for screening for depression: Secondary | ICD-10-CM | POA: Diagnosis not present

## 2019-03-06 DIAGNOSIS — R5383 Other fatigue: Secondary | ICD-10-CM | POA: Diagnosis not present

## 2019-03-06 DIAGNOSIS — Z299 Encounter for prophylactic measures, unspecified: Secondary | ICD-10-CM | POA: Diagnosis not present

## 2019-03-06 DIAGNOSIS — Z1211 Encounter for screening for malignant neoplasm of colon: Secondary | ICD-10-CM | POA: Diagnosis not present

## 2019-03-06 DIAGNOSIS — Z7189 Other specified counseling: Secondary | ICD-10-CM | POA: Diagnosis not present

## 2019-03-06 DIAGNOSIS — Z0000 Encounter for general adult medical examination without abnormal findings: Secondary | ICD-10-CM | POA: Diagnosis not present

## 2019-03-07 DIAGNOSIS — Z79899 Other long term (current) drug therapy: Secondary | ICD-10-CM | POA: Diagnosis not present

## 2019-03-07 DIAGNOSIS — R5383 Other fatigue: Secondary | ICD-10-CM | POA: Diagnosis not present

## 2019-03-07 DIAGNOSIS — E559 Vitamin D deficiency, unspecified: Secondary | ICD-10-CM | POA: Diagnosis not present

## 2019-03-07 DIAGNOSIS — E039 Hypothyroidism, unspecified: Secondary | ICD-10-CM | POA: Diagnosis not present

## 2019-03-07 DIAGNOSIS — E7800 Pure hypercholesterolemia, unspecified: Secondary | ICD-10-CM | POA: Diagnosis not present

## 2019-03-12 DIAGNOSIS — I10 Essential (primary) hypertension: Secondary | ICD-10-CM | POA: Diagnosis not present

## 2019-03-12 DIAGNOSIS — M159 Polyosteoarthritis, unspecified: Secondary | ICD-10-CM | POA: Diagnosis not present

## 2019-04-12 DIAGNOSIS — I10 Essential (primary) hypertension: Secondary | ICD-10-CM | POA: Diagnosis not present

## 2019-04-12 DIAGNOSIS — M159 Polyosteoarthritis, unspecified: Secondary | ICD-10-CM | POA: Diagnosis not present

## 2019-05-08 DIAGNOSIS — E7800 Pure hypercholesterolemia, unspecified: Secondary | ICD-10-CM | POA: Diagnosis not present

## 2019-05-08 DIAGNOSIS — Z299 Encounter for prophylactic measures, unspecified: Secondary | ICD-10-CM | POA: Diagnosis not present

## 2019-05-08 DIAGNOSIS — Z6831 Body mass index (BMI) 31.0-31.9, adult: Secondary | ICD-10-CM | POA: Diagnosis not present

## 2019-05-08 DIAGNOSIS — I10 Essential (primary) hypertension: Secondary | ICD-10-CM | POA: Diagnosis not present

## 2019-05-08 DIAGNOSIS — Z713 Dietary counseling and surveillance: Secondary | ICD-10-CM | POA: Diagnosis not present

## 2019-06-12 DIAGNOSIS — I10 Essential (primary) hypertension: Secondary | ICD-10-CM | POA: Diagnosis not present

## 2019-06-12 DIAGNOSIS — M159 Polyosteoarthritis, unspecified: Secondary | ICD-10-CM | POA: Diagnosis not present

## 2019-06-19 DIAGNOSIS — E039 Hypothyroidism, unspecified: Secondary | ICD-10-CM | POA: Diagnosis not present

## 2019-06-19 DIAGNOSIS — E7800 Pure hypercholesterolemia, unspecified: Secondary | ICD-10-CM | POA: Diagnosis not present

## 2019-06-19 DIAGNOSIS — Z23 Encounter for immunization: Secondary | ICD-10-CM | POA: Diagnosis not present

## 2019-06-19 DIAGNOSIS — Z299 Encounter for prophylactic measures, unspecified: Secondary | ICD-10-CM | POA: Diagnosis not present

## 2019-06-19 DIAGNOSIS — Z6830 Body mass index (BMI) 30.0-30.9, adult: Secondary | ICD-10-CM | POA: Diagnosis not present

## 2019-06-19 DIAGNOSIS — I10 Essential (primary) hypertension: Secondary | ICD-10-CM | POA: Diagnosis not present

## 2019-07-09 DIAGNOSIS — M159 Polyosteoarthritis, unspecified: Secondary | ICD-10-CM | POA: Diagnosis not present

## 2019-07-09 DIAGNOSIS — I10 Essential (primary) hypertension: Secondary | ICD-10-CM | POA: Diagnosis not present

## 2019-10-10 ENCOUNTER — Encounter: Payer: Self-pay | Admitting: Orthopaedic Surgery

## 2019-10-10 ENCOUNTER — Ambulatory Visit: Payer: Self-pay

## 2019-10-10 ENCOUNTER — Other Ambulatory Visit: Payer: Self-pay

## 2019-10-10 ENCOUNTER — Ambulatory Visit (INDEPENDENT_AMBULATORY_CARE_PROVIDER_SITE_OTHER): Payer: PRIVATE HEALTH INSURANCE | Admitting: Orthopaedic Surgery

## 2019-10-10 VITALS — Ht 61.0 in | Wt 155.0 lb

## 2019-10-10 DIAGNOSIS — M545 Low back pain, unspecified: Secondary | ICD-10-CM

## 2019-10-10 DIAGNOSIS — R269 Unspecified abnormalities of gait and mobility: Secondary | ICD-10-CM

## 2019-10-10 DIAGNOSIS — Z9181 History of falling: Secondary | ICD-10-CM

## 2019-10-10 NOTE — Progress Notes (Signed)
[name], female DOB:[birth year], 84 y.o. FXJ:[id number]  Chief Complaint  Patient presents with  . Back Pain    spine curving/can't keep balance without the walker    HPI  Nicole Yoder is a 84 y.o. female who has problems with balance and falling.  She has a significant list to the right and cannot straighten up when standing.  As she walks she bends to the right more and more then eventually falls unless she is using her walker.  She is gradually getting worse.  When sitting she can bend to the left but not when standing.  She gets rigid.  She has no head injury. She has no spasm.   I have advised she see a neurologist and be evaluated.  She may have a Parkinson type problem.  I have gotten x-rays of the thoracolumbar spine today.  She also has another problem.  She got in the mail a large back brace she did not order.  Medicare paid about $1,300.00 of the price and she, the patient now owes around $242.00.  She was surprised she got it and has no idea where it came from.  She showed me the EOB from Medicare.  I have told her how to investigate this and the office staff will help her.   Body mass index is 29.29 kg/m.  ROS  Review of Systems  HENT: Negative for congestion.   Respiratory: Negative for cough and shortness of breath.   Cardiovascular: Negative for chest pain and leg swelling.  Endocrine: Positive for cold intolerance.  Musculoskeletal: Positive for arthralgias, back pain, joint swelling and myalgias.  Allergic/Immunologic: Positive for environmental allergies.  All other systems reviewed and are negative.   All other systems reviewed and are negative.  The following is a summary of the past history medically, past history surgically, known current medicines, social history and family history.  This information is gathered electronically by the computer from prior information and documentation.  I review this each visit and have found  including this information at this point in the chart is beneficial and informative.    Past Medical History:  Diagnosis Date  . Anxiety   . High blood pressure   . High cholesterol   . Neuropathy     Past Surgical History:  Procedure Laterality Date  . broken ribs    . broken wrist    . CARPAL TUNNEL RELEASE Bilateral   . EYE SURGERY    . GANGLION CYST EXCISION    . TONSILLECTOMY AND ADENOIDECTOMY      Family History  Problem Relation Age of Onset  . Heart attack Mother   . Lung cancer Father     Social History Social History   Tobacco Use  . Smoking status: Never Smoker  . Smokeless tobacco: Never Used  Substance Use Topics  . Alcohol use: No  . Drug use: No    Allergies  Allergen Reactions  . Prednisone Other (See Comments)    High blood pressure   . Celebrex [Celecoxib]   . Codeine   . Ivp Dye [Iodinated Diagnostic Agents]   . Lyrica [Pregabalin]   . Neurontin [Gabapentin]   . Penicillins   . Statins   . Sulfa Antibiotics   . Tramadol     Current Outpatient Medications  Medication Sig Dispense Refill  . acetaminophen (TYLENOL) 500 MG tablet Take 650 mg by mouth 2 (two) times daily.    Marland Kitchen atenolol (TENORMIN) 25 MG tablet Take  25 mg by mouth daily.    . Cholecalciferol (VITAMIN D PO) Take 1,000 Units by mouth daily.     . Cyanocobalamin (VITAMIN B 12) 100 MCG LOZG Take 1,000 mcg by mouth daily.    . diclofenac sodium (VOLTAREN) 1 % GEL Apply topically as directed.    Marland Kitchen levothyroxine (SYNTHROID, LEVOTHROID) 75 MCG tablet Take 75 mcg by mouth daily before breakfast.    . meloxicam (MOBIC) 15 MG tablet Take 15 mg by mouth daily.    . mirabegron ER (MYRBETRIQ) 25 MG TB24 tablet Take 25 mg by mouth daily. Reported on 01/22/2016    . sertraline (ZOLOFT) 100 MG tablet Take 100 mg by mouth daily.    . sertraline (ZOLOFT) 50 MG tablet      No current facility-administered medications for this visit.     Physical Exam  Height 5\' 1"  (1.549 m), weight 155 lb  (70.3 kg).  Constitutional: overall normal hygiene, normal nutrition, well developed, normal grooming, normal body habitus. Assistive device:walker  Musculoskeletal: gait and station Limp to right, muscle tone and strength are abnormal, no tremors or atrophy is present. When standing she bends to the right about a 30 degree angle and cannot straighten up at all.  She bends more to the right with walking a short distance.  When sitting she can bend to the left.  She has no spasm. She has marked limitations of using the shoulders, more on the right, from severe degenerative joint disease.  Prior x-rays show very little joint present and almost complete loss of humeral head. .  Neurological: coordination overall abnormal.  Deep tendon reflex/nerve stretch intact.  Sensation normal.  Cranial nerves II-XII intact.   Skin:   Normal overall no scars, lesions, ulcers or rashes. No psoriasis.  Psychiatric: Alert and oriented x 3.  Recent memory intact, remote memory unclear.  Normal mood and affect. Well groomed.  Good eye contact.  Cardiovascular: overall no swelling, no varicosities, no edema bilaterally, normal temperatures of the legs and arms, no clubbing, cyanosis and good capillary refill.  Lymphatic: palpation is normal.  All other systems reviewed and are negative   The patient has been educated about the nature of the problem(s) and counseled on treatment options.  The patient appeared to understand what I have discussed and is in agreement with it.  Encounter Diagnoses  Name Primary?  . Abnormality of gait Yes  . Lumbar pain   . History of falling    X-rays were done of the thoracolumbar spine, reported separately. She has degenerative changes of the lumbar spine more at L4-L5 and L3-L4 with a gentle to the right scoliosis based at L3.  No fracture is noted.  PLAN Call if any problems.  Precautions discussed.  Continue current medications.   She will need to see  neurologist  Return to clinic to see neurologist for balance and stiffness problems.   Electronically Signed Sanjuana Kava, MD 1/20/202111:01 AM

## 2019-10-19 DIAGNOSIS — I10 Essential (primary) hypertension: Secondary | ICD-10-CM | POA: Diagnosis not present

## 2019-10-19 DIAGNOSIS — Z6829 Body mass index (BMI) 29.0-29.9, adult: Secondary | ICD-10-CM | POA: Diagnosis not present

## 2019-10-19 DIAGNOSIS — K219 Gastro-esophageal reflux disease without esophagitis: Secondary | ICD-10-CM | POA: Diagnosis not present

## 2019-10-19 DIAGNOSIS — Z299 Encounter for prophylactic measures, unspecified: Secondary | ICD-10-CM | POA: Diagnosis not present

## 2019-10-19 DIAGNOSIS — Z789 Other specified health status: Secondary | ICD-10-CM | POA: Diagnosis not present

## 2019-10-31 ENCOUNTER — Telehealth: Payer: Self-pay | Admitting: Neurology

## 2019-10-31 ENCOUNTER — Ambulatory Visit (INDEPENDENT_AMBULATORY_CARE_PROVIDER_SITE_OTHER): Payer: Medicare Other | Admitting: Neurology

## 2019-10-31 ENCOUNTER — Other Ambulatory Visit: Payer: Self-pay

## 2019-10-31 ENCOUNTER — Encounter: Payer: Self-pay | Admitting: Neurology

## 2019-10-31 VITALS — BP 161/75 | HR 58 | Temp 97.3°F | Ht 61.0 in | Wt 158.0 lb

## 2019-10-31 DIAGNOSIS — G8929 Other chronic pain: Secondary | ICD-10-CM

## 2019-10-31 DIAGNOSIS — M5440 Lumbago with sciatica, unspecified side: Secondary | ICD-10-CM

## 2019-10-31 DIAGNOSIS — R202 Paresthesia of skin: Secondary | ICD-10-CM

## 2019-10-31 DIAGNOSIS — R32 Unspecified urinary incontinence: Secondary | ICD-10-CM | POA: Diagnosis not present

## 2019-10-31 DIAGNOSIS — R269 Unspecified abnormalities of gait and mobility: Secondary | ICD-10-CM | POA: Insufficient documentation

## 2019-10-31 NOTE — Telephone Encounter (Signed)
Medicare/[facility] order sent to [facility]. No auth they will reach out to the patient to schedule.

## 2019-10-31 NOTE — Progress Notes (Signed)
PATIENT: Nicole Yoder DOB: 08-15-1936  Chief Complaint  Patient presents with  . Gait Problem    Reports worsening gait. Feels stiffness and weakness in legs, right side worse than left. She walks stooped over towards the left. She uses a rolling walker for assistance. She recently completed PT but she did not see any real benefit.   . Orthopaedics    Sanjuana Kava, MD - referring provider  . PCP    Monico Blitz, MD     HISTORICAL  Nicole Yoder is a 84 year old female, seen in request by orthopedic surgeon Dr. Sanjuana Kava, her primary care physician Dr. Monico Blitz, for evaluation of gait abnormality, initial evaluation was on October 31, 2019,  I have reviewed and summarized the referring note from the referring physician.  She had a past medical history of hypertension, multiple degenerative joint disease, peripheral neuropathy, presenting with gradual onset gait abnormality, progressively worse since 2017  She began to notice bilateral feet paresthesia around 2017, gradually getting worse, now to bilateral ankle level now, she has constant bilateral below ankle numbness,  She used to work at a desk job, had multiple bilateral carpal tunnel syndrome, presented with intermittent bilateral fingertips paresthesia, which is still present.  She had a severe multiple joints arthritis, severe bilateral shoulder pain, limited range of motion, she began to rely on cane around 2017, now rely on her walker, she also complains of severe low back pain, radiating pain to bilateral lower extremity, could no longer standing up eating her potato, she also complains of worsening urinary urgency, incontinence.   She also complains of visual hallucinations, seeing people in her room, she carried on a conversation with them, she lives with her elderly sister since she moved from [place], she retired from [employer] work  REVIEW OF SYSTEMS: Full 14 system  review of systems performed and notable only for as above All other review of systems were negative.  ALLERGIES: Allergies  Allergen Reactions  . Prednisone Other (See Comments)    High blood pressure   . Celebrex [Celecoxib]   . Codeine   . Lyrica [Pregabalin]   . Neurontin [Gabapentin]   . Penicillins   . Statins Other (See Comments)    Muscle aches   . Sulfa Antibiotics   . Tramadol Other (See Comments)    hallucinations  . Ivp Dye [Iodinated Diagnostic Agents] Rash    HOME MEDICATIONS: Current Outpatient Medications  Medication Sig Dispense Refill  . acetaminophen (TYLENOL) 500 MG tablet Take 650 mg by mouth 2 (two) times daily.    Marland Kitchen atenolol (TENORMIN) 25 MG tablet Take 25 mg by mouth daily.    . Cholecalciferol (VITAMIN D PO) Take 1,000 Units by mouth daily.     . Cyanocobalamin (VITAMIN B 12) 100 MCG LOZG Take 1,000 mcg by mouth daily.    . diclofenac sodium (VOLTAREN) 1 % GEL Apply 2 g topically as needed.     Marland Kitchen levothyroxine (SYNTHROID, LEVOTHROID) 75 MCG tablet Take 75 mcg by mouth daily before breakfast.    . meloxicam (MOBIC) 15 MG tablet Take 15 mg by mouth as needed.     . mirabegron ER (MYRBETRIQ) 25 MG TB24 tablet Take 25 mg by mouth daily. Reported on 01/22/2016     No current facility-administered medications for this visit.    PAST MEDICAL HISTORY: Past Medical History:  Diagnosis Date  . Anxiety   . Gait abnormality   . High blood pressure   .  High cholesterol   . Neuropathy   . Urinary frequency     PAST SURGICAL HISTORY: Past Surgical History:  Procedure Laterality Date  . broken ribs    . broken wrist    . CARPAL TUNNEL RELEASE Bilateral   . EYE SURGERY    . GANGLION CYST EXCISION    . TONSILLECTOMY AND ADENOIDECTOMY      FAMILY HISTORY: Family History  Problem Relation Age of Onset  . Heart attack Mother   . Lung cancer Father     SOCIAL HISTORY: Social History   Socioeconomic History  . Marital status: Single    Spouse  name: Not on file  . Number of children: 0  . Years of education: 44  . Highest education level: Not on file  Occupational History  . Occupation: Retired    Comment: retired  Tobacco Use  . Smoking status: Never Smoker  . Smokeless tobacco: Never Used  Substance and Sexual Activity  . Alcohol use: No  . Drug use: No  . Sexual activity: Not on file  Other Topics Concern  . Not on file  Social History Narrative   Patient is retired and lives at home with her friend. Patient has a high school education. Patient is from [place].      Right handed.   Caffeine- sometimes tea.   Social Determinants of Health   Financial Resource Strain:   . Difficulty of Paying Living Expenses: Not on file  Food Insecurity:   . Worried About Charity fundraiser in the Last Year: Not on file  . Ran Out of Food in the Last Year: Not on file  Transportation Needs:   . Lack of Transportation (Medical): Not on file  . Lack of Transportation (Non-Medical): Not on file  Physical Activity:   . Days of Exercise per Week: Not on file  . Minutes of Exercise per Session: Not on file  Stress:   . Feeling of Stress : Not on file  Social Connections:   . Frequency of Communication with Friends and Family: Not on file  . Frequency of Social Gatherings with Friends and Family: Not on file  . Attends Religious Services: Not on file  . Active Member of Clubs or Organizations: Not on file  . Attends Archivist Meetings: Not on file  . Marital Status: Not on file  Intimate Partner Violence:   . Fear of Current or Ex-Partner: Not on file  . Emotionally Abused: Not on file  . Physically Abused: Not on file  . Sexually Abused: Not on file     PHYSICAL EXAM   Vitals:   10/31/19 1342  BP: (!) 161/75  Pulse: (!) 58  Temp: (!) 97.3 F (36.3 C)  Weight: 158 lb (71.7 kg)  Height: 5\' 1"  (1.549 m)    Not recorded      Body mass index is 29.85 kg/m.  PHYSICAL EXAMNIATION:  Gen: NAD,  conversant, well nourised, well groomed                     Cardiovascular: Regular rate rhythm, no peripheral edema, warm, nontender. Eyes: Conjunctivae clear without exudates or hemorrhage Neck: Supple, no carotid bruits. Pulmonary: Clear to auscultation bilaterally   NEUROLOGICAL EXAM:  MENTAL STATUS: Speech:    Speech is normal; fluent and spontaneous with normal comprehension.  Cognition:     Orientation to time, place and person     Normal recent and remote memory  Normal Attention span and concentration     Normal Language, naming, repeating,spontaneous speech     Fund of knowledge   CRANIAL NERVES: CN II: Visual fields are full to confrontation. Pupils are round equal and briskly reactive to light. CN III, IV, VI: extraocular movement are normal. No ptosis. CN V: Facial sensation is intact to light touch CN VII: Face is symmetric with normal eye closure  CN VIII: Hearing is normal to causal conversation. CN IX, X: Phonation is normal. CN XI: Head turning and shoulder shrug are intact  MOTOR: Severe limited range of motion of bilateral shoulder, but felt there was no significant bilateral upper extremity proximal distal muscle weakness, there was no significant bilateral lower extremity proximal or distal muscle weakness.  REFLEXES: Reflexes are 3 and symmetric at the biceps, triceps, knees, and ankles. Plantar responses are extensor bilaterally  SENSORY: Length dependent decreased to light touch, pinprick and vibratory sensation to knee level  COORDINATION: There is no trunk or limb dysmetria noted.  GAIT/STANCE: She needs multiple attempts, rely on her walker to get up from seated position, leaning to her whole body towards the right side, scoliosis, unsteady gait  DIAGNOSTIC DATA (LABS, IMAGING, TESTING) - I reviewed patient records, labs, notes, testing and imaging myself where available.   ASSESSMENT AND PLAN  LATERRIA LASOTA is a 84 y.o. female     Progressive gait abnormality Worsening urinary incontinence  Likely multifactorial, bilateral shoulder pain, low back pain, peripheral neuropathy,  Need to rule out cervical, thoracic myelopathy, lumbar radiculopathy  Proceed with MRI of cervical, thoracic, lumbar spine,  EMG nerve conduction study  Ambulatory referral to Home physical therapy  Laboratory evaluation for etiology of peripheral neuropathy  Marcial Pacas, M.D. Ph.D.  [facility] [address] [place],  [address] Ph: [phone] Fax: [phone]  CC: Sanjuana Kava, MDShah, Weldon Picking, MD

## 2019-11-01 ENCOUNTER — Telehealth: Payer: Self-pay | Admitting: Neurology

## 2019-11-01 LAB — CBC WITH DIFFERENTIAL/PLATELET
Basophils Absolute: 0 10*3/uL (ref 0.0–0.2)
Basos: 0 %
EOS (ABSOLUTE): 0.2 10*3/uL (ref 0.0–0.4)
Eos: 3 %
Hematocrit: 38.6 % (ref 34.0–46.6)
Hemoglobin: 12.5 g/dL (ref 11.1–15.9)
Immature Grans (Abs): 0 10*3/uL (ref 0.0–0.1)
Immature Granulocytes: 0 %
Lymphocytes Absolute: 1.7 10*3/uL (ref 0.7–3.1)
Lymphs: 33 %
MCH: 28.9 pg (ref 26.6–33.0)
MCHC: 32.4 g/dL (ref 31.5–35.7)
MCV: 89 fL (ref 79–97)
Monocytes Absolute: 0.3 10*3/uL (ref 0.1–0.9)
Monocytes: 6 %
Neutrophils Absolute: 2.8 10*3/uL (ref 1.4–7.0)
Neutrophils: 58 %
Platelets: 216 10*3/uL (ref 150–450)
RBC: 4.33 x10E6/uL (ref 3.77–5.28)
RDW: 13.9 % (ref 11.7–15.4)
WBC: 5 10*3/uL (ref 3.4–10.8)

## 2019-11-01 LAB — COMPREHENSIVE METABOLIC PANEL
ALT: 13 IU/L (ref 0–32)
AST: 16 IU/L (ref 0–40)
Albumin/Globulin Ratio: 1.9 (ref 1.2–2.2)
Albumin: 4.3 g/dL (ref 3.6–4.6)
Alkaline Phosphatase: 107 IU/L (ref 39–117)
BUN/Creatinine Ratio: 29 — ABNORMAL HIGH (ref 12–28)
BUN: 32 mg/dL — ABNORMAL HIGH (ref 8–27)
Bilirubin Total: 0.4 mg/dL (ref 0.0–1.2)
CO2: 21 mmol/L (ref 20–29)
Calcium: 9.4 mg/dL (ref 8.7–10.3)
Chloride: 110 mmol/L — ABNORMAL HIGH (ref 96–106)
Creatinine, Ser: 1.12 mg/dL — ABNORMAL HIGH (ref 0.57–1.00)
GFR calc Af Amer: 53 mL/min/{1.73_m2} — ABNORMAL LOW (ref >59)
GFR calc non Af Amer: 46 mL/min/{1.73_m2} — ABNORMAL LOW (ref >59)
Globulin, Total: 2.3 g/dL (ref 1.5–4.5)
Glucose: 72 mg/dL (ref 65–99)
Potassium: 5 mmol/L (ref 3.5–5.2)
Sodium: 142 mmol/L (ref 134–144)
Total Protein: 6.6 g/dL (ref 6.0–8.5)

## 2019-11-01 LAB — CK: Total CK: 32 U/L (ref 26–161)

## 2019-11-01 LAB — HGB A1C W/O EAG: Hgb A1c MFr Bld: 5.3 % (ref 4.8–5.6)

## 2019-11-01 LAB — SEDIMENTATION RATE: Sed Rate: 26 mm/hr (ref 0–40)

## 2019-11-01 LAB — RPR: RPR Ser Ql: NONREACTIVE

## 2019-11-01 LAB — ANA W/REFLEX IF POSITIVE: Anti Nuclear Antibody (ANA): NEGATIVE

## 2019-11-01 LAB — FOLATE: Folate: 10.3 ng/mL (ref >3.0)

## 2019-11-01 LAB — TSH: TSH: 3.19 u[IU]/mL (ref 0.450–4.500)

## 2019-11-01 LAB — C-REACTIVE PROTEIN: CRP: 9 mg/L (ref 0–10)

## 2019-11-01 LAB — RHEUMATOID FACTOR: Rheumatoid fact SerPl-aCnc: <10 IU/mL (ref 0.0–13.9)

## 2019-11-01 LAB — VITAMIN B12: Vitamin B-12: 1685 pg/mL — ABNORMAL HIGH (ref 232–1245)

## 2019-11-01 LAB — VITAMIN D 25 HYDROXY (VIT D DEFICIENCY, FRACTURES): Vit D, 25-Hydroxy: 37 ng/mL (ref 30.0–100.0)

## 2019-11-01 NOTE — Telephone Encounter (Signed)
Please call patient, laboratory evaluations showed mild elevated creatinine, with decreased kidney function, GFR was 46, normal being 59, also elevated BUN, chloride, she should increase water intake  Rest of the laboratory evaluation showed no significant abnormalities.

## 2019-11-01 NOTE — Telephone Encounter (Signed)
I spoke to the patient and she verbalized understanding of her lab results. She was in agreement to increase her water intake.

## 2019-11-05 ENCOUNTER — Telehealth: Payer: Self-pay | Admitting: Neurology

## 2019-11-05 NOTE — Telephone Encounter (Signed)
Patient called wanting to know why she cant be scheduled with [facility] for her MRI states she verified with insurance and they are covered

## 2019-11-06 NOTE — Telephone Encounter (Signed)
Faxed MRI orders to [facility] on [address] they will reach out to the patient to schedule.

## 2019-11-07 DIAGNOSIS — Z23 Encounter for immunization: Secondary | ICD-10-CM | POA: Diagnosis not present

## 2019-11-09 ENCOUNTER — Telehealth: Payer: Self-pay

## 2019-11-09 NOTE — Telephone Encounter (Signed)
MRI of what?  Brain or neck?  Please get last EMG report and I can answer that better.

## 2019-11-09 NOTE — Telephone Encounter (Signed)
Patient left voicemail asking if you think she needs to go through any MRI and NCV testing again. Her neurologist Dr. Krista Blue is suggesting these she said. Stated she knew you would be frank with her about things like this.  Please advise

## 2019-11-19 ENCOUNTER — Institutional Professional Consult (permissible substitution): Payer: Medicare Other | Admitting: Neurology

## 2019-11-20 ENCOUNTER — Telehealth: Payer: Self-pay | Admitting: Neurology

## 2019-11-20 NOTE — Telephone Encounter (Signed)
Nicole Yoder!   Hope you are staying safe in this weather. I have spoken to Nicole Yoder and she wants to cancel the referral until her insurance is straightened out. It is a mess! She thought she had Medicare and she really has [employer]. She does not remember changing. She is going to let you know when she gets it straighten out so that she can have it reordered at that point.   Let me know if there is someone ese we can assist with   Nicole Yoder  [phone]

## 2019-11-24 ENCOUNTER — Other Ambulatory Visit: Payer: PRIVATE HEALTH INSURANCE

## 2019-11-24 ENCOUNTER — Inpatient Hospital Stay: Admission: RE | Admit: 2019-11-24 | Payer: PRIVATE HEALTH INSURANCE | Source: Ambulatory Visit

## 2019-12-05 DIAGNOSIS — Z23 Encounter for immunization: Secondary | ICD-10-CM | POA: Diagnosis not present

## 2019-12-12 ENCOUNTER — Encounter: Payer: PRIVATE HEALTH INSURANCE | Admitting: Neurology

## 2020-01-24 ENCOUNTER — Encounter: Payer: Self-pay | Admitting: Orthopaedic Surgery

## 2020-01-24 ENCOUNTER — Ambulatory Visit (INDEPENDENT_AMBULATORY_CARE_PROVIDER_SITE_OTHER): Payer: Medicare Other | Admitting: Orthopaedic Surgery

## 2020-01-24 ENCOUNTER — Other Ambulatory Visit: Payer: Self-pay

## 2020-01-24 DIAGNOSIS — Z9181 History of falling: Secondary | ICD-10-CM | POA: Diagnosis not present

## 2020-01-24 DIAGNOSIS — M19011 Primary osteoarthritis, right shoulder: Secondary | ICD-10-CM

## 2020-01-24 DIAGNOSIS — M545 Low back pain, unspecified: Secondary | ICD-10-CM

## 2020-01-24 DIAGNOSIS — R269 Unspecified abnormalities of gait and mobility: Secondary | ICD-10-CM | POA: Diagnosis not present

## 2020-01-24 NOTE — Progress Notes (Signed)
Virtual Visit via Telephone Note  I connected with@ on 01/24/20 at 10:20 AM EDT by telephone and verified that I am speaking with the correct person using two identifiers.  Location: Patient: home Provider: office   I discussed the limitations, risks, security and privacy concerns of performing an evaluation and management service by telephone and the availability of in person appointments. I also discussed with the patient that there may be a patient responsible charge related to this service. The patient expressed understanding and agreed to proceed.   History of Present Illness: I had a long talk with her about her current situation.  She has severe degenerative joint disease of the right shoulder and has very limited motion.  She also has a balance issue and "lists" to the right when walking.  She has been going to PT.  PT is concerned she has radiculopathy to the right hand from a problem in her neck or arm.  EMGs were arranged via her family doctor but she canceled them.  Her hand pain is getting worse.  She is having a hard time managing her household work.  She is very independent and wants to "hang on" as long as she can.  We had a frank discussion about the possibility and probability that she needs to consider assisted living.  She listened to it and will consider it. She will look at various options.  I have recommended she have the EMGs done.  She will call and arrange this.  I will see her or talk to her as needed.   Observations/Objective: Per above.  Assessment and Plan: Encounter Diagnoses  Name Primary?  . Lumbar pain Yes  . Abnormality of gait   . History of falling   . Osteoarthritis of right shoulder, unspecified osteoarthritis type      Follow Up Instructions: To call as needed.   I discussed the assessment and treatment plan with the patient. The patient was provided an opportunity to ask questions and all were answered. The patient agreed with the plan  and demonstrated an understanding of the instructions.   The patient was advised to call back or seek an in-person evaluation if the symptoms worsen or if the condition fails to improve as anticipated.  I provided 15 minutes of non-face-to-face time during this encounter.   Sanjuana Kava, MD

## 2020-01-30 ENCOUNTER — Telehealth: Payer: Self-pay | Admitting: Orthopaedic Surgery

## 2020-01-30 NOTE — Telephone Encounter (Signed)
Nicole Yoder called this morning stating that she has an appointment for EMG/NCV scheduled for June 9th.  She is going to [facility] to have this done.  She asks about an order being sent to have this done.  She said she was unsure if her PCP was to send order or if you were going to send one.  She said she would rather that you send it so that she can make sure that you get the results back.  Can you have Nicole Yoder send order for this patient?  Thanks

## 2020-01-30 NOTE — Telephone Encounter (Signed)
Get the EMGs.

## 2020-01-31 ENCOUNTER — Other Ambulatory Visit: Payer: Self-pay | Admitting: *Deleted

## 2020-01-31 ENCOUNTER — Other Ambulatory Visit: Payer: Self-pay | Admitting: Orthopaedic Surgery

## 2020-01-31 DIAGNOSIS — M25511 Pain in right shoulder: Secondary | ICD-10-CM

## 2020-02-17 DIAGNOSIS — M159 Polyosteoarthritis, unspecified: Secondary | ICD-10-CM | POA: Diagnosis not present

## 2020-02-17 DIAGNOSIS — I10 Essential (primary) hypertension: Secondary | ICD-10-CM | POA: Diagnosis not present

## 2020-02-27 ENCOUNTER — Telehealth: Payer: Self-pay | Admitting: Neurology

## 2020-02-27 ENCOUNTER — Ambulatory Visit (INDEPENDENT_AMBULATORY_CARE_PROVIDER_SITE_OTHER): Payer: Medicare Other | Admitting: Neurology

## 2020-02-27 DIAGNOSIS — R269 Unspecified abnormalities of gait and mobility: Secondary | ICD-10-CM

## 2020-02-27 DIAGNOSIS — R2689 Other abnormalities of gait and mobility: Secondary | ICD-10-CM | POA: Diagnosis not present

## 2020-02-27 DIAGNOSIS — R32 Unspecified urinary incontinence: Secondary | ICD-10-CM

## 2020-02-27 DIAGNOSIS — R202 Paresthesia of skin: Secondary | ICD-10-CM

## 2020-02-27 DIAGNOSIS — M542 Cervicalgia: Secondary | ICD-10-CM

## 2020-02-27 DIAGNOSIS — M5440 Lumbago with sciatica, unspecified side: Secondary | ICD-10-CM

## 2020-02-27 MED ORDER — ALPRAZOLAM 0.5 MG PO TABS: ORAL_TABLET | ORAL | 0 refills | Status: DC

## 2020-02-27 NOTE — Progress Notes (Addendum)
PATIENT: Nicole Yoder DOB: Oct 04, 1935  No chief complaint on file.    HISTORICAL  Nicole Yoder is a 84 year old female, seen in request by orthopedic surgeon Dr. Sanjuana Kava, her primary care physician Dr. Monico Blitz, for evaluation of gait abnormality, initial evaluation was on October 31, 2019,  I have reviewed and summarized the referring note from the referring physician.  She had a past medical history of hypertension, multiple degenerative joint disease, peripheral neuropathy, presenting with gradual onset gait abnormality, progressively worse since 2017  She began to notice bilateral feet paresthesia around 2017, gradually getting worse, now to bilateral ankle level now, she has constant bilateral below ankle numbness,  She used to work at a desk job, had multiple bilateral carpal tunnel syndrome, presented with intermittent bilateral fingertips paresthesia, which is still present.  She had a severe multiple joints arthritis, severe bilateral shoulder pain, limited range of motion, she began to rely on cane around 2017, now rely on her walker, she also complains of severe low back pain, radiating pain to bilateral lower extremity, could no longer standing up eating her potato, she also complains of worsening urinary urgency, incontinence.  She also complains of visual hallucinations, seeing people in her room, she carried on a conversation with them, she lives with her elderly sister since she moved from [place], she retired from [employer] work  UPDATE February 27 2020: Laboratory evaluation in February 2021 showed normal or negative RPR, folic acid, C-reactive protein, TSH, CPK, ESR, ANA, CBC, vitamin D, rheumatoid factor, A1c was 5.3, normal vitamin B12, CMP showed mild elevated creatinine 1.12, with mildly decreased GFR of 53  She continue complains of progressive worsening gait abnormality, significant bilateral shoulder arthritis,  limited range of motion of bilateral shoulder, also has worsening kyphosis, limited range of motion of her neck, cracking sound when she moves her neck, occasionally feels like warming water running through her body with neck movement,  She continue complains of worsening urinary incontinence for 2 years, has to use a pad in bed at nighttime, worsening bilateral upper and lower extremity paresthesia  EMG nerve conduction study today on February 27, 2020 showed mild axonal peripheral neuropathy, mild chronic neuropathic changes involving left L4-L5 S1 myotomes, no evidence of active process.  There was no evidence of left upper extremity focal neuropathy, left cervical radiculopathy.  REVIEW OF SYSTEMS: Full 14 system review of systems performed and notable only for as above All other review of systems were negative.  ALLERGIES: Allergies  Allergen Reactions  . Prednisone Other (See Comments)    High blood pressure   . Celebrex [Celecoxib]   . Codeine   . Lyrica [Pregabalin]   . Neurontin [Gabapentin]   . Penicillins   . Statins Other (See Comments)    Muscle aches   . Sulfa Antibiotics   . Tramadol Other (See Comments)    hallucinations  . Ivp Dye [Iodinated Diagnostic Agents] Rash    HOME MEDICATIONS: Current Outpatient Medications  Medication Sig Dispense Refill  . acetaminophen (TYLENOL) 500 MG tablet Take 650 mg by mouth 2 (two) times daily.    Marland Kitchen atenolol (TENORMIN) 25 MG tablet Take 25 mg by mouth daily.    . Cholecalciferol (VITAMIN D PO) Take 1,000 Units by mouth daily.     . Cyanocobalamin (VITAMIN B 12) 100 MCG LOZG Take 1,000 mcg by mouth daily.    . diclofenac sodium (VOLTAREN) 1 % GEL Apply 2 g topically as needed.     Marland Kitchen  levothyroxine (SYNTHROID, LEVOTHROID) 75 MCG tablet Take 75 mcg by mouth daily before breakfast.    . meloxicam (MOBIC) 15 MG tablet Take 15 mg by mouth as needed.     . mirabegron ER (MYRBETRIQ) 25 MG TB24 tablet Take 25 mg by mouth daily. Reported on  01/22/2016     No current facility-administered medications for this visit.    PAST MEDICAL HISTORY: Past Medical History:  Diagnosis Date  . Anxiety   . Gait abnormality   . High blood pressure   . High cholesterol   . Neuropathy   . Urinary frequency     PAST SURGICAL HISTORY: Past Surgical History:  Procedure Laterality Date  . broken ribs    . broken wrist    . CARPAL TUNNEL RELEASE Bilateral   . EYE SURGERY    . GANGLION CYST EXCISION    . TONSILLECTOMY AND ADENOIDECTOMY      FAMILY HISTORY: Family History  Problem Relation Age of Onset  . Heart attack Mother   . Lung cancer Father     SOCIAL HISTORY: Social History   Socioeconomic History  . Marital status: Single    Spouse name: Not on file  . Number of children: 0  . Years of education: 56  . Highest education level: Not on file  Occupational History  . Occupation: Retired    Comment: retired  Tobacco Use  . Smoking status: Never Smoker  . Smokeless tobacco: Never Used  Substance and Sexual Activity  . Alcohol use: No  . Drug use: No  . Sexual activity: Not on file  Other Topics Concern  . Not on file  Social History Narrative   Patient is retired and lives at home with her friend. Patient has a high school education. Patient is from [place].      Right handed.   Caffeine- sometimes tea.   Social Determinants of Health   Financial Resource Strain:   . Difficulty of Paying Living Expenses:   Food Insecurity:   . Worried About Charity fundraiser in the Last Year:   . Arboriculturist in the Last Year:   Transportation Needs:   . Film/video editor (Medical):   Marland Kitchen Lack of Transportation (Non-Medical):   Physical Activity:   . Days of Exercise per Week:   . Minutes of Exercise per Session:   Stress:   . Feeling of Stress :   Social Connections:   . Frequency of Communication with Friends and Family:   . Frequency of Social Gatherings with Friends and Family:   . Attends Religious  Services:   . Active Member of Clubs or Organizations:   . Attends Archivist Meetings:   Marland Kitchen Marital Status:   Intimate Partner Violence:   . Fear of Current or Ex-Partner:   . Emotionally Abused:   Marland Kitchen Physically Abused:   . Sexually Abused:      PHYSICAL EXAM   There were no vitals filed for this visit.  Not recorded      There is no height or weight on file to calculate BMI.  PHYSICAL EXAMNIATION:  Gen: NAD, conversant, well nourised, well groomed        NEUROLOGICAL EXAM:  MENTAL STATUS: Speech/cognition: Awake, alert, oriented to history taking and casual conversation   CRANIAL NERVES: CN II: Visual fields are full to confrontation. Pupils are round equal and briskly reactive to light. CN III, IV, VI: extraocular movement are normal. No ptosis. CN V:  Facial sensation is intact to light touch CN VII: Face is symmetric with normal eye closure  CN VIII: Hearing is normal to causal conversation. CN IX, X: Phonation is normal. CN XI: Head turning and shoulder shrug are intact  MOTOR: Severe limited range of motion of bilateral shoulder, but felt there was no significant bilateral upper extremity proximal and distal muscle weakness, there was no significant bilateral lower extremity proximal or distal muscle weakness.  REFLEXES: Reflexes are 3 and symmetric at the biceps, triceps, knees, and ankles. Plantar responses are extensor bilaterally  SENSORY: Length dependent decreased to light touch, pinprick and vibratory sensation to knee level  COORDINATION: There is no trunk or limb dysmetria noted.  GAIT/STANCE: She needs multiple attempts, rely on her walker to get up from seated position, leaning to her whole body towards the right side, significant kyphosis to the left side of her thoracic spine, unsteady gait  DIAGNOSTIC DATA (LABS, IMAGING, TESTING) - I reviewed patient records, labs, notes, testing and imaging myself where available.   ASSESSMENT  AND PLAN  RAMI WADDLE is a 84 y.o. female    Progressive gait abnormality Worsening urinary incontinence Bilateral upper and lower extremity paresthesia  Her gait abnormality are multifactorial, significant bilateral shoulder pain, limited range of motion of bilateral shoulder, low back pain, bilateral feet paresthesia  Need to rule out cervical, thoracic myelopathy, lumbar radiculopathy  Proceed with MRI of cervical, thoracic, lumbar spine, she needs open MRI, premedicated with Xanax,  EMG nerve conduction study on February 27, 2020 showed mild peripheral neuropathy, mild chronic left lumbosacral radiculopathy  Laboratory evaluation for etiology of peripheral neuropathy was essentially normal  Marcial Pacas, M.D. Ph.D.  [facility] [address] [place], [place] [address] Ph: [phone] Fax: [phone]  CC: Sanjuana Kava, MDShah, Weldon Picking, MD

## 2020-02-27 NOTE — Procedures (Signed)
Full Name: Nicole Yoder Gender: Female MRN #: [id number] Date of Birth: [birth year]    Visit Date: 02/27/2020 10:34 Age: 84 Years Examining Physician: Marcial Pacas, MD  Referring Physician: Marcial Pacas, MD Height: 5 feet 1 inch History: 84 year old female with history of significant kyphosis, presenting with progressive gait abnormality, bilateral shoulder pain, upper and lower extremity paresthesia, worsening urinary incontinence.  Summary of the tests: Nerve conduction study: Bilateral sural, superficial peroneal sensory responses showed mild to moderately decreased snap amplitude.  Bilateral tibial, peroneal to EDB motor responses were normal.  Left ulnar sensory responses were normal.  Electromyography:  Selected needle examination of left upper, lower extremity muscles; left cervical and lumbosacral paraspinal muscles were performed.  There is evidence of mild chronic neuropathic changes involving left L4, L5, S1 myotomes.  There is no evidence of active denervation at left lumbosacral paraspinal muscles.   Conclusion: This is an abnormal study.  There is electrodiagnostic evidence of mild axonal sensorimotor polyneuropathy.  There is also evidence of chronic mild left lumbosacral radiculopathy, there is no evidence of active process.    ------------------------------- Marcial Pacas, M.D. PhD  [facility] [address], [place] [address] Tel: [phone] Fax: [phone]  Verbal informed consent was obtained from the patient, patient was informed of potential risk of procedure, including bruising, bleeding, hematoma formation, infection, muscle weakness, muscle pain, numbness, among others.         [place]    Nerve / Sites Muscle Latency Ref. Amplitude Ref. Rel Amp Segments Distance Velocity Ref. Area    ms ms mV mV %  cm m/s m/s mVms  R Peroneal - EDB     Ankle EDB 4.7 ?6.5 2.5 ?2.0 100 Ankle - EDB 9   9.8     Fib head EDB 9.9  2.1  86.1 Fib  head - Ankle 24 46 ?44 9.0     Pop fossa EDB 12.2  2.1  97.6 Pop fossa - Fib head 10 45 ?44 8.3         Pop fossa - Ankle      L Peroneal - EDB     Ankle EDB 4.5 ?6.5 2.7 ?2.0 100 Ankle - EDB 9   10.8     Fib head EDB 9.9  2.4  88.5 Fib head - Ankle 24 44 ?44 10.2     Pop fossa EDB 12.2  2.4  102 Pop fossa - Fib head 10 44 ?44 9.2         Pop fossa - Ankle      R Tibial - AH     Ankle AH 3.9 ?5.8 4.1 ?4.0 100 Ankle - AH 9   14.4     Pop fossa AH 12.1  3.4  82.6 Pop fossa - Ankle 34 42 ?41 13.5  L Tibial - AH     Ankle AH 4.7 ?5.8 4.0 ?4.0 100 Ankle - AH 9   10.4     Pop fossa AH 12.6  2.9  73.1 Pop fossa - Ankle 35 44 ?41 11.1             SNC    Nerve / Sites Rec. Site Peak Lat Ref.  Amp Ref. Segments Distance    ms ms V V  cm  R Sural - Ankle (Calf)     Calf Ankle 4.2 ?4.4 2 ?6 Calf - Ankle 14  L Sural - Ankle (Calf)     Calf Ankle  4.4 ?4.4 2 ?6 Calf - Ankle 14  R Superficial peroneal - Ankle     Lat leg Ankle 4.1 ?4.4 2 ?6 Lat leg - Ankle 14  L Superficial peroneal - Ankle     Lat leg Ankle 4.0 ?4.4 5 ?6 Lat leg - Ankle 14  L Ulnar - Orthodromic, (Dig V, Mid palm)     Dig V Wrist 3.1 ?3.1 6 ?5 Dig V - Wrist 5               F  Wave    Nerve F Lat Ref.   ms ms  R Tibial - AH 51.5 ?56.0  L Tibial - AH 50.2 ?56.0         EMG Summary Table    Spontaneous MUAP Recruitment  Muscle IA Fib PSW Fasc Other Amp Dur. Poly Pattern  L. Tibialis anterior Normal None None None _______ Normal Normal Normal Reduced  L. Tibialis posterior Normal None None None _______ Normal Normal Normal Reduced  L. Peroneus longus Normal None None None _______ Normal Normal Normal Reduced  L. Gastrocnemius (Medial head) Normal None None None _______ Normal Normal Normal Reduced  L. Vastus lateralis Normal None None None _______ Normal Normal Normal Reduced  L. Lumbar paraspinals (mid) Normal None None None _______ Normal Normal Normal Normal  L. Lumbar paraspinals (low) Normal None None None _______  Normal Normal Normal Normal  L. First dorsal interosseous Normal None None None _______ Normal Normal Normal Normal  L. Pronator teres Normal None None None _______ Normal Normal Normal Normal  L. Biceps brachii Normal None None None _______ Normal Normal Normal Normal  L. Deltoid Normal None None None _______ Normal Normal Normal Normal  L. Triceps brachii Normal None None None _______ Normal Normal Normal Normal  L. Cervical paraspinals Normal None None None _______ Normal Normal Normal Normal

## 2020-02-27 NOTE — Telephone Encounter (Signed)
She need open MRI, prefer facility on [address], premedicate with xanax.

## 2020-02-27 NOTE — Telephone Encounter (Signed)
medicare/medico no auth order faxed to [facility]. they will reach out to the patient to schedule.

## 2020-02-27 NOTE — Progress Notes (Signed)
EMG report is under procedure 

## 2020-03-13 DIAGNOSIS — M47814 Spondylosis without myelopathy or radiculopathy, thoracic region: Secondary | ICD-10-CM | POA: Diagnosis not present

## 2020-03-13 DIAGNOSIS — M4713 Other spondylosis with myelopathy, cervicothoracic region: Secondary | ICD-10-CM | POA: Diagnosis not present

## 2020-03-13 DIAGNOSIS — M47816 Spondylosis without myelopathy or radiculopathy, lumbar region: Secondary | ICD-10-CM | POA: Diagnosis not present

## 2020-03-13 DIAGNOSIS — M5124 Other intervertebral disc displacement, thoracic region: Secondary | ICD-10-CM | POA: Diagnosis not present

## 2020-03-13 DIAGNOSIS — M419 Scoliosis, unspecified: Secondary | ICD-10-CM | POA: Diagnosis not present

## 2020-03-13 DIAGNOSIS — M5001 Cervical disc disorder with myelopathy,  high cervical region: Secondary | ICD-10-CM | POA: Diagnosis not present

## 2020-03-13 DIAGNOSIS — M4712 Other spondylosis with myelopathy, cervical region: Secondary | ICD-10-CM | POA: Diagnosis not present

## 2020-03-19 DIAGNOSIS — Z1339 Encounter for screening examination for other mental health and behavioral disorders: Secondary | ICD-10-CM | POA: Diagnosis not present

## 2020-03-19 DIAGNOSIS — Z0000 Encounter for general adult medical examination without abnormal findings: Secondary | ICD-10-CM | POA: Diagnosis not present

## 2020-03-19 DIAGNOSIS — M549 Dorsalgia, unspecified: Secondary | ICD-10-CM | POA: Diagnosis not present

## 2020-03-19 DIAGNOSIS — Z6829 Body mass index (BMI) 29.0-29.9, adult: Secondary | ICD-10-CM | POA: Diagnosis not present

## 2020-03-19 DIAGNOSIS — M159 Polyosteoarthritis, unspecified: Secondary | ICD-10-CM | POA: Diagnosis not present

## 2020-03-19 DIAGNOSIS — R5383 Other fatigue: Secondary | ICD-10-CM | POA: Diagnosis not present

## 2020-03-19 DIAGNOSIS — Z299 Encounter for prophylactic measures, unspecified: Secondary | ICD-10-CM | POA: Diagnosis not present

## 2020-03-19 DIAGNOSIS — Z1331 Encounter for screening for depression: Secondary | ICD-10-CM | POA: Diagnosis not present

## 2020-03-19 DIAGNOSIS — Z7189 Other specified counseling: Secondary | ICD-10-CM | POA: Diagnosis not present

## 2020-03-19 DIAGNOSIS — I10 Essential (primary) hypertension: Secondary | ICD-10-CM | POA: Diagnosis not present

## 2020-03-19 DIAGNOSIS — Z1211 Encounter for screening for malignant neoplasm of colon: Secondary | ICD-10-CM | POA: Diagnosis not present

## 2020-03-19 DIAGNOSIS — E039 Hypothyroidism, unspecified: Secondary | ICD-10-CM | POA: Diagnosis not present

## 2020-03-20 ENCOUNTER — Telehealth: Payer: Self-pay | Admitting: Neurology

## 2020-03-20 NOTE — Telephone Encounter (Signed)
I called the patient's home number and reviewed the MRI results. Her follow up has been scheduled for 04/16/20. She will check-in at 2pm.

## 2020-03-20 NOTE — Telephone Encounter (Signed)
I personally reviewed her MRI of cervical spine without contrast on March 13, 2020 from [facility] Multilevel degenerative changes, severe central canal stenosis at C4-5, with impression of cervical cord, no cord signal change   MRI of lumbar spine showed multilevel degenerative changes, with superimposed scoliosis, most advanced at L4-5, L5-S1,  Please give her a follow-up visit for [place] to review MRIs together,

## 2020-04-14 NOTE — Telephone Encounter (Signed)
I spoke to the patient and she has been rescheduled for appt to review her MRI scans.

## 2020-04-14 NOTE — Telephone Encounter (Signed)
Pt has called stating she was unaware the appointment was cancelled.  She declined the next available appointment date which is late Sept.  Pt is asking if she can just be called briefly with the results to her MRI.  Please call

## 2020-04-16 ENCOUNTER — Ambulatory Visit: Payer: PRIVATE HEALTH INSURANCE | Admitting: Neurology

## 2020-04-16 NOTE — Telephone Encounter (Signed)
Pt has an upset stomach and will not be able to come into the office today.  Pt is asking if Dr Krista Blue would allow this to be a telephone visit or if Dr Krista Blue would prefer pt r/s to come into the office.  Please call

## 2020-04-16 NOTE — Telephone Encounter (Addendum)
I spoke to the patient. She wanted to reschedule her appt to an office visit. She declined several morning appts and accepted 1pm on 05/29/20. She will arrive early for check-in.

## 2020-04-18 DIAGNOSIS — M159 Polyosteoarthritis, unspecified: Secondary | ICD-10-CM | POA: Diagnosis not present

## 2020-04-18 DIAGNOSIS — I10 Essential (primary) hypertension: Secondary | ICD-10-CM | POA: Diagnosis not present

## 2020-05-19 DIAGNOSIS — M159 Polyosteoarthritis, unspecified: Secondary | ICD-10-CM | POA: Diagnosis not present

## 2020-05-19 DIAGNOSIS — I10 Essential (primary) hypertension: Secondary | ICD-10-CM | POA: Diagnosis not present

## 2020-05-29 ENCOUNTER — Encounter: Payer: Self-pay | Admitting: Neurology

## 2020-05-29 ENCOUNTER — Other Ambulatory Visit: Payer: Self-pay

## 2020-05-29 ENCOUNTER — Ambulatory Visit (INDEPENDENT_AMBULATORY_CARE_PROVIDER_SITE_OTHER): Payer: Medicare Other | Admitting: Neurology

## 2020-05-29 VITALS — BP 148/70 | HR 58 | Ht 61.0 in | Wt 149.0 lb

## 2020-05-29 DIAGNOSIS — M4802 Spinal stenosis, cervical region: Secondary | ICD-10-CM

## 2020-05-29 DIAGNOSIS — R32 Unspecified urinary incontinence: Secondary | ICD-10-CM

## 2020-05-29 DIAGNOSIS — R202 Paresthesia of skin: Secondary | ICD-10-CM | POA: Diagnosis not present

## 2020-05-29 DIAGNOSIS — M48061 Spinal stenosis, lumbar region without neurogenic claudication: Secondary | ICD-10-CM

## 2020-05-29 DIAGNOSIS — R269 Unspecified abnormalities of gait and mobility: Secondary | ICD-10-CM

## 2020-05-29 MED ORDER — DULOXETINE HCL 20 MG PO CPEP: 20.0000 mg | ORAL_CAPSULE | Freq: Every day | ORAL | 11 refills | Status: DC

## 2020-05-29 NOTE — Progress Notes (Signed)
PATIENT: Nicole Yoder DOB: Jan 21, 1936  Chief Complaint  Patient presents with  . Gait Abnormality/Paresthesia    She is here today to review her MRI scans of cervical, thoracic and lumbar.     HISTORICAL  Nicole Yoder is a 84 year old female, seen in request by orthopedic surgeon Dr. Sanjuana Kava, her primary care physician Dr. Monico Blitz, for evaluation of gait abnormality, initial evaluation was on October 31, 2019,  I have reviewed and summarized the referring note from the referring physician.  She had a past medical history of hypertension, multiple degenerative joint disease, peripheral neuropathy, presenting with gradual onset gait abnormality, progressively worse since 2017  She began to notice bilateral feet paresthesia around 2017, gradually getting worse, now to bilateral ankle level now, she has constant bilateral below ankle numbness,  She used to work at a desk job, had multiple bilateral carpal tunnel syndrome, presented with intermittent bilateral fingertips paresthesia, which is still present.  She had a severe multiple joints arthritis, severe bilateral shoulder pain, limited range of motion, she began to rely on cane around 2017, now rely on her walker, she also complains of severe low back pain, radiating pain to bilateral lower extremity, could no longer standing up eating her potato, she also complains of worsening urinary urgency, incontinence.  She also complains of visual hallucinations, seeing people in her room, she carried on a conversation with them, she lives with her elderly sister since she moved from [place], she retired from [employer] work  UPDATE February 27 2020: Laboratory evaluation in February 2021 showed normal or negative RPR, folic acid, C-reactive protein, TSH, CPK, ESR, ANA, CBC, vitamin D, rheumatoid factor, A1c was 5.3, normal vitamin B12, CMP showed mild elevated creatinine 1.12, with mildly decreased  GFR of 53  She continue complains of progressive worsening gait abnormality, significant bilateral shoulder arthritis, limited range of motion of bilateral shoulder, also has worsening kyphosis, limited range of motion of her neck, cracking sound when she moves her neck, occasionally feels like warming water running through her body with neck movement,  She continue complains of worsening urinary incontinence for 2 years, has to use a pad in bed at nighttime, worsening bilateral upper and lower extremity paresthesia  EMG nerve conduction study today on February 27, 2020 showed mild axonal peripheral neuropathy, mild chronic neuropathic changes involving left L4-L5 S1 myotomes, no evidence of active process.  There was no evidence of left upper extremity focal neuropathy, left cervical radiculopathy.  UPDATE Sept 9 2021: She complains of gradual worsening functional status, diffuse body achy pain, especially low back pain, right shoulder pain, worsening gait abnormality, radiating pain to left lower extremity, left leg, foot numbness, sometimes it  is not there  I personally reviewed MRIs from [facility] in June 2021,  MRI cervical spine, severe central canal stenosis C4-5, compression of cervical cord, no cord signal abnormality, multiple levels multilevel foraminal narrowing  MRI of thoracic spine, no acute abnormality, thoracic degenerative changes, no evidence of canal or foraminal narrowing, moderate to severe intrahepatic, and extrahepatic biliary ductal dilatation  MRI of lumbar spine, multilevel degenerative changes, superimposed on scoliosis, multilevel central canal and subarticular stenosis, most advanced at L4-5, L5-S1, with variable degree of foraminal narrowing REVIEW OF SYSTEMS: Full 14 system review of systems performed and notable only for as above All other review of systems were negative.  ALLERGIES: Allergies  Allergen Reactions  . Prednisone Other (See Comments)    High  blood pressure   . Celebrex [Celecoxib]   . Codeine   . Lyrica [Pregabalin]   . Neurontin [Gabapentin]   . Penicillins   . Statins Other (See Comments)    Muscle aches   . Sulfa Antibiotics   . Tramadol Other (See Comments)    hallucinations  . Ivp Dye [Iodinated Diagnostic Agents] Rash    HOME MEDICATIONS: Current Outpatient Medications  Medication Sig Dispense Refill  . acetaminophen (TYLENOL) 500 MG tablet Take 650 mg by mouth 2 (two) times daily.    Marland Kitchen atenolol (TENORMIN) 25 MG tablet Take 25 mg by mouth daily.    . Cholecalciferol (VITAMIN D PO) Take 1,000 Units by mouth daily.     . Cyanocobalamin (VITAMIN B 12) 100 MCG LOZG Take 1,000 mcg by mouth daily.    . diclofenac sodium (VOLTAREN) 1 % GEL Apply 2 g topically as needed.     Marland Kitchen escitalopram (LEXAPRO) 10 MG tablet Take 10 mg by mouth daily.    Marland Kitchen levothyroxine (SYNTHROID, LEVOTHROID) 75 MCG tablet Take 75 mcg by mouth daily before breakfast.    . meloxicam (MOBIC) 15 MG tablet Take 15 mg by mouth as needed.     . mirabegron ER (MYRBETRIQ) 25 MG TB24 tablet Take 25 mg by mouth daily. Reported on 01/22/2016     No current facility-administered medications for this visit.    PAST MEDICAL HISTORY: Past Medical History:  Diagnosis Date  . Anxiety   . Gait abnormality   . High blood pressure   . High cholesterol   . Neuropathy   . Urinary frequency     PAST SURGICAL HISTORY: Past Surgical History:  Procedure Laterality Date  . broken ribs    . broken wrist    . CARPAL TUNNEL RELEASE Bilateral   . EYE SURGERY    . GANGLION CYST EXCISION    . TONSILLECTOMY AND ADENOIDECTOMY      FAMILY HISTORY: Family History  Problem Relation Age of Onset  . Heart attack Mother   . Lung cancer Father     SOCIAL HISTORY: Social History   Socioeconomic History  . Marital status: Single    Spouse name: Not on file  . Number of children: 0  . Years of education: 66  . Highest education level: Not on file  Occupational  History  . Occupation: Retired    Comment: retired  Tobacco Use  . Smoking status: Never Smoker  . Smokeless tobacco: Never Used  Substance and Sexual Activity  . Alcohol use: No  . Drug use: No  . Sexual activity: Not on file  Other Topics Concern  . Not on file  Social History Narrative   Patient is retired and lives at home with her friend. Patient has a high school education. Patient is from [place].      Right handed.   Caffeine- sometimes tea.   Social Determinants of Health   Financial Resource Strain:   . Difficulty of Paying Living Expenses: Not on file  Food Insecurity:   . Worried About Charity fundraiser in the Last Year: Not on file  . Ran Out of Food in the Last Year: Not on file  Transportation Needs:   . Lack of Transportation (Medical): Not on file  . Lack of Transportation (Non-Medical): Not on file  Physical Activity:   . Days of Exercise per Week: Not on file  . Minutes of Exercise per Session: Not on file  Stress:   . Feeling  of Stress : Not on file  Social Connections:   . Frequency of Communication with Friends and Family: Not on file  . Frequency of Social Gatherings with Friends and Family: Not on file  . Attends Religious Services: Not on file  . Active Member of Clubs or Organizations: Not on file  . Attends Archivist Meetings: Not on file  . Marital Status: Not on file  Intimate Partner Violence:   . Fear of Current or Ex-Partner: Not on file  . Emotionally Abused: Not on file  . Physically Abused: Not on file  . Sexually Abused: Not on file     PHYSICAL EXAM   Vitals:   05/29/20 1257  BP: (!) 148/70  Pulse: (!) 58  Weight: 149 lb (67.6 kg)  Height: $Remove'5\' 1"'USWOEnr$  (1.549 m)   Not recorded     Body mass index is 28.15 kg/m.  PHYSICAL EXAMNIATION:  Gen: NAD, conversant, well nourised, well groomed        NEUROLOGICAL EXAM:  MENTAL STATUS: Speech/cognition: Awake, alert, oriented to history taking and casual  conversation   CRANIAL NERVES: CN II: Visual fields are full to confrontation. Pupils are round equal and briskly reactive to light. CN III, IV, VI: extraocular movement are normal. No ptosis. CN V: Facial sensation is intact to light touch CN VII: Face is symmetric with normal eye closure  CN VIII: Hearing is normal to causal conversation. CN IX, X: Phonation is normal. CN XI: Head turning and shoulder shrug are intact  MOTOR: Severe limited range of motion of bilateral shoulder, but felt there was no significant bilateral upper extremity proximal and distal muscle weakness, there was no significant bilateral lower extremity proximal weakness, mild bilateral toe extension weakness  REFLEXES: Reflexes are 3 and symmetric at the biceps, triceps, knees, and ankles. Plantar responses are extensor bilaterally  SENSORY: Length dependent decreased to light touch, pinprick and vibratory sensation to knee level  COORDINATION: There is no trunk or limb dysmetria noted.  GAIT/STANCE: She needs multiple attempts, rely on her walker to get up from seated position, leaning to her whole body towards the right side, significant kyphosis to the left side of her thoracic spine, unsteady gait  DIAGNOSTIC DATA (LABS, IMAGING, TESTING) - I reviewed patient records, labs, notes, testing and imaging myself where available.   ASSESSMENT AND PLAN  Nicole Yoder is a 84 y.o. female    Progressive gait abnormality Worsening urinary incontinence Bilateral upper and lower extremity paresthesia  Her gait abnormality are multifactorial, significant scoliosis, evidence of cervical spondylitic myelopathy, bilateral lumbar radiculopathy, limitation from the pain,  EMG nerve conduction study on February 27, 2020 showed mild peripheral neuropathy, mild chronic left lumbosacral radiculopathy  Laboratory evaluation for etiology of peripheral neuropathy was essentially normal  She just finished physical therapy,  provide limited help, she does not want to consider epidural injection or surgical options at this point,  Also complains of depression, lack of motivation, especially morning times, tried Lexapro without significant benefit, will add on Cymbalta 20 mg daily  Marcial Pacas, M.D. Ph.D.  [facility] [address] [place], [place] [address] Ph: [phone] Fax: [phone]  CC: Sanjuana Kava, MDShah, Weldon Picking, MD

## 2020-06-19 DIAGNOSIS — M159 Polyosteoarthritis, unspecified: Secondary | ICD-10-CM | POA: Diagnosis not present

## 2020-06-19 DIAGNOSIS — I10 Essential (primary) hypertension: Secondary | ICD-10-CM | POA: Diagnosis not present

## 2020-07-18 DIAGNOSIS — I10 Essential (primary) hypertension: Secondary | ICD-10-CM | POA: Diagnosis not present

## 2020-07-18 DIAGNOSIS — M159 Polyosteoarthritis, unspecified: Secondary | ICD-10-CM | POA: Diagnosis not present

## 2020-08-09 DIAGNOSIS — Z23 Encounter for immunization: Secondary | ICD-10-CM | POA: Diagnosis not present

## 2020-08-19 DIAGNOSIS — I10 Essential (primary) hypertension: Secondary | ICD-10-CM | POA: Diagnosis not present

## 2020-08-19 DIAGNOSIS — M159 Polyosteoarthritis, unspecified: Secondary | ICD-10-CM | POA: Diagnosis not present

## 2020-09-18 DIAGNOSIS — I10 Essential (primary) hypertension: Secondary | ICD-10-CM | POA: Diagnosis not present

## 2020-09-18 DIAGNOSIS — M159 Polyosteoarthritis, unspecified: Secondary | ICD-10-CM | POA: Diagnosis not present

## 2020-11-27 ENCOUNTER — Ambulatory Visit: Payer: Self-pay | Admitting: Neurology

## 2021-01-14 ENCOUNTER — Telehealth: Payer: Self-pay | Admitting: Neurology

## 2021-01-14 ENCOUNTER — Other Ambulatory Visit: Payer: Self-pay | Admitting: *Deleted

## 2021-01-14 NOTE — Telephone Encounter (Signed)
Pt has called to report that she believes DULoxetine (CYMBALTA) 20 MG capsule is helping her but it makes her very anxious and she does not like the way it makes her feel.  Please call to discuss other options

## 2021-01-14 NOTE — Telephone Encounter (Addendum)
I spoke to the patient. She was started on duloxetine 20mg  in Sept 2020 and stopped the medication one month ago. For the last several months, she has been feeling more anxious, depressed, crying frequently, lack of enthusiasm for anything, unmotivated. She feels isolated since the pandemic. No is longer going to social events or volunteering. Her sister, Nicole Yoder, lives with her. She was previously taking Lexapro then changed to Cymbalta. No real benefit with either medication. Feels her mental state has become much worse. Attributes right shoulder pain causing limited ROM being part of the problem. She has difficulty doing things for herself. She has not discussed these issues with her PCP but desires an evaluation. I did a conference call with Dr. Trena Platt office while she was on the phone with me. I assisted her in getting an appt set up with him this week (on 01/16/21). She appreciated the help and felt relieved to have taken the first step in addressing these concerns.

## 2021-01-14 NOTE — Addendum Note (Signed)
Addended by: Noberto Retort C on: 01/14/2021 02:54 PM   Modules accepted: Orders

## 2021-01-16 DIAGNOSIS — Z299 Encounter for prophylactic measures, unspecified: Secondary | ICD-10-CM | POA: Diagnosis not present

## 2021-01-16 DIAGNOSIS — M25519 Pain in unspecified shoulder: Secondary | ICD-10-CM | POA: Diagnosis not present

## 2021-01-16 DIAGNOSIS — I10 Essential (primary) hypertension: Secondary | ICD-10-CM | POA: Diagnosis not present

## 2021-05-18 ENCOUNTER — Telehealth: Payer: Self-pay | Admitting: Neurology

## 2021-05-18 NOTE — Telephone Encounter (Signed)
I called the patient back. She is no longer taking any medications from our practice. Last seen 05/29/20. She was previously on duloxetine but stopped the medication in March 2022. States Dr. Manuella Ghazi made some medication changes for her but she is unsure what she is taking now. Feels like her medication changes have caused her hallucinations to be worse. She is going to call Dr. Trena Platt office to get further medical advice. She is aware we are happy to see her here, if her PCP feels she needs an updated neurological evaluation.

## 2021-05-18 NOTE — Telephone Encounter (Signed)
Pt states that this new medication Dr Krista Blue has her on is causing horrible hallucinations, pt states she is seeing people crawling in and out of her window.  Pt also states she feels horrible.  Please call.

## 2021-06-02 DIAGNOSIS — Z20822 Contact with and (suspected) exposure to covid-19: Secondary | ICD-10-CM | POA: Diagnosis not present

## 2021-06-19 DIAGNOSIS — I10 Essential (primary) hypertension: Secondary | ICD-10-CM | POA: Diagnosis not present

## 2021-07-06 DIAGNOSIS — Z20828 Contact with and (suspected) exposure to other viral communicable diseases: Secondary | ICD-10-CM | POA: Diagnosis not present

## 2021-08-03 DIAGNOSIS — D329 Benign neoplasm of meninges, unspecified: Secondary | ICD-10-CM | POA: Diagnosis not present

## 2021-08-03 DIAGNOSIS — Z79899 Other long term (current) drug therapy: Secondary | ICD-10-CM | POA: Diagnosis not present

## 2021-08-03 DIAGNOSIS — R410 Disorientation, unspecified: Secondary | ICD-10-CM | POA: Diagnosis not present

## 2021-08-03 DIAGNOSIS — I6622 Occlusion and stenosis of left posterior cerebral artery: Secondary | ICD-10-CM | POA: Diagnosis not present

## 2021-08-03 DIAGNOSIS — Z20822 Contact with and (suspected) exposure to covid-19: Secondary | ICD-10-CM | POA: Diagnosis not present

## 2021-08-03 DIAGNOSIS — Z66 Do not resuscitate: Secondary | ICD-10-CM | POA: Diagnosis not present

## 2021-08-03 DIAGNOSIS — R262 Difficulty in walking, not elsewhere classified: Secondary | ICD-10-CM | POA: Diagnosis not present

## 2021-08-03 DIAGNOSIS — R5383 Other fatigue: Secondary | ICD-10-CM | POA: Diagnosis not present

## 2021-08-03 DIAGNOSIS — R2689 Other abnormalities of gait and mobility: Secondary | ICD-10-CM | POA: Diagnosis not present

## 2021-08-03 DIAGNOSIS — R404 Transient alteration of awareness: Secondary | ICD-10-CM | POA: Diagnosis not present

## 2021-08-03 DIAGNOSIS — I771 Stricture of artery: Secondary | ICD-10-CM | POA: Diagnosis not present

## 2021-08-03 DIAGNOSIS — Z9181 History of falling: Secondary | ICD-10-CM | POA: Diagnosis not present

## 2021-08-03 DIAGNOSIS — I10 Essential (primary) hypertension: Secondary | ICD-10-CM | POA: Diagnosis not present

## 2021-08-03 DIAGNOSIS — R4701 Aphasia: Secondary | ICD-10-CM | POA: Diagnosis not present

## 2021-08-03 DIAGNOSIS — R251 Tremor, unspecified: Secondary | ICD-10-CM | POA: Diagnosis not present

## 2021-08-03 DIAGNOSIS — R29810 Facial weakness: Secondary | ICD-10-CM | POA: Diagnosis not present

## 2021-08-03 DIAGNOSIS — R569 Unspecified convulsions: Secondary | ICD-10-CM | POA: Diagnosis not present

## 2021-08-03 DIAGNOSIS — I639 Cerebral infarction, unspecified: Secondary | ICD-10-CM | POA: Diagnosis not present

## 2021-08-03 DIAGNOSIS — R531 Weakness: Secondary | ICD-10-CM | POA: Diagnosis not present

## 2021-08-03 DIAGNOSIS — Z7409 Other reduced mobility: Secondary | ICD-10-CM | POA: Diagnosis not present

## 2021-08-03 DIAGNOSIS — Z880 Allergy status to penicillin: Secondary | ICD-10-CM | POA: Diagnosis not present

## 2021-08-03 DIAGNOSIS — E039 Hypothyroidism, unspecified: Secondary | ICD-10-CM | POA: Diagnosis not present

## 2021-08-04 DIAGNOSIS — E038 Other specified hypothyroidism: Secondary | ICD-10-CM | POA: Diagnosis not present

## 2021-08-04 DIAGNOSIS — R569 Unspecified convulsions: Secondary | ICD-10-CM | POA: Diagnosis not present

## 2021-08-04 DIAGNOSIS — I10 Essential (primary) hypertension: Secondary | ICD-10-CM | POA: Diagnosis not present

## 2021-08-05 DIAGNOSIS — R29898 Other symptoms and signs involving the musculoskeletal system: Secondary | ICD-10-CM | POA: Diagnosis not present

## 2021-08-05 DIAGNOSIS — R531 Weakness: Secondary | ICD-10-CM | POA: Diagnosis not present

## 2021-08-05 DIAGNOSIS — E038 Other specified hypothyroidism: Secondary | ICD-10-CM | POA: Diagnosis not present

## 2021-08-05 DIAGNOSIS — G9389 Other specified disorders of brain: Secondary | ICD-10-CM | POA: Diagnosis not present

## 2021-08-05 DIAGNOSIS — I10 Essential (primary) hypertension: Secondary | ICD-10-CM | POA: Diagnosis not present

## 2021-08-06 DIAGNOSIS — I10 Essential (primary) hypertension: Secondary | ICD-10-CM | POA: Diagnosis not present

## 2021-08-06 DIAGNOSIS — E038 Other specified hypothyroidism: Secondary | ICD-10-CM | POA: Diagnosis not present

## 2021-08-07 DIAGNOSIS — E038 Other specified hypothyroidism: Secondary | ICD-10-CM | POA: Diagnosis not present

## 2021-08-07 DIAGNOSIS — I10 Essential (primary) hypertension: Secondary | ICD-10-CM | POA: Diagnosis not present

## 2021-08-09 DIAGNOSIS — M1990 Unspecified osteoarthritis, unspecified site: Secondary | ICD-10-CM | POA: Diagnosis not present

## 2021-08-09 DIAGNOSIS — N1832 Chronic kidney disease, stage 3b: Secondary | ICD-10-CM | POA: Diagnosis not present

## 2021-08-09 DIAGNOSIS — R6889 Other general symptoms and signs: Secondary | ICD-10-CM | POA: Diagnosis not present

## 2021-08-09 DIAGNOSIS — L299 Pruritus, unspecified: Secondary | ICD-10-CM | POA: Diagnosis not present

## 2021-08-09 DIAGNOSIS — E039 Hypothyroidism, unspecified: Secondary | ICD-10-CM | POA: Diagnosis not present

## 2021-08-09 DIAGNOSIS — Z7401 Bed confinement status: Secondary | ICD-10-CM | POA: Diagnosis not present

## 2021-08-09 DIAGNOSIS — R42 Dizziness and giddiness: Secondary | ICD-10-CM | POA: Diagnosis not present

## 2021-08-09 DIAGNOSIS — E559 Vitamin D deficiency, unspecified: Secondary | ICD-10-CM | POA: Diagnosis not present

## 2021-08-09 DIAGNOSIS — R569 Unspecified convulsions: Secondary | ICD-10-CM | POA: Diagnosis not present

## 2021-08-09 DIAGNOSIS — Z79899 Other long term (current) drug therapy: Secondary | ICD-10-CM | POA: Diagnosis not present

## 2021-08-09 DIAGNOSIS — D519 Vitamin B12 deficiency anemia, unspecified: Secondary | ICD-10-CM | POA: Diagnosis not present

## 2021-08-09 DIAGNOSIS — R0902 Hypoxemia: Secondary | ICD-10-CM | POA: Diagnosis not present

## 2021-08-09 DIAGNOSIS — I10 Essential (primary) hypertension: Secondary | ICD-10-CM | POA: Diagnosis not present

## 2021-08-09 DIAGNOSIS — N184 Chronic kidney disease, stage 4 (severe): Secondary | ICD-10-CM | POA: Diagnosis not present

## 2021-08-09 DIAGNOSIS — I639 Cerebral infarction, unspecified: Secondary | ICD-10-CM | POA: Diagnosis not present

## 2021-08-09 DIAGNOSIS — Z8673 Personal history of transient ischemic attack (TIA), and cerebral infarction without residual deficits: Secondary | ICD-10-CM | POA: Diagnosis not present

## 2021-08-09 DIAGNOSIS — R232 Flushing: Secondary | ICD-10-CM | POA: Diagnosis not present

## 2021-08-09 DIAGNOSIS — E785 Hyperlipidemia, unspecified: Secondary | ICD-10-CM | POA: Diagnosis not present

## 2021-08-09 DIAGNOSIS — R531 Weakness: Secondary | ICD-10-CM | POA: Diagnosis not present

## 2021-08-09 DIAGNOSIS — Z20828 Contact with and (suspected) exposure to other viral communicable diseases: Secondary | ICD-10-CM | POA: Diagnosis not present

## 2021-08-10 DIAGNOSIS — I639 Cerebral infarction, unspecified: Secondary | ICD-10-CM | POA: Diagnosis not present

## 2021-08-10 DIAGNOSIS — R569 Unspecified convulsions: Secondary | ICD-10-CM | POA: Diagnosis not present

## 2021-08-10 DIAGNOSIS — R531 Weakness: Secondary | ICD-10-CM | POA: Diagnosis not present

## 2021-08-11 DIAGNOSIS — Z79899 Other long term (current) drug therapy: Secondary | ICD-10-CM | POA: Diagnosis not present

## 2021-08-11 DIAGNOSIS — E785 Hyperlipidemia, unspecified: Secondary | ICD-10-CM | POA: Diagnosis not present

## 2021-08-12 DIAGNOSIS — E039 Hypothyroidism, unspecified: Secondary | ICD-10-CM | POA: Diagnosis not present

## 2021-08-12 DIAGNOSIS — I10 Essential (primary) hypertension: Secondary | ICD-10-CM | POA: Diagnosis not present

## 2021-08-12 DIAGNOSIS — R569 Unspecified convulsions: Secondary | ICD-10-CM | POA: Diagnosis not present

## 2021-08-14 DIAGNOSIS — E559 Vitamin D deficiency, unspecified: Secondary | ICD-10-CM | POA: Diagnosis not present

## 2021-08-14 DIAGNOSIS — R42 Dizziness and giddiness: Secondary | ICD-10-CM | POA: Diagnosis not present

## 2021-08-14 DIAGNOSIS — N184 Chronic kidney disease, stage 4 (severe): Secondary | ICD-10-CM | POA: Diagnosis not present

## 2021-08-17 DIAGNOSIS — L299 Pruritus, unspecified: Secondary | ICD-10-CM | POA: Diagnosis not present

## 2021-08-17 DIAGNOSIS — E785 Hyperlipidemia, unspecified: Secondary | ICD-10-CM | POA: Diagnosis not present

## 2021-08-17 DIAGNOSIS — R232 Flushing: Secondary | ICD-10-CM | POA: Diagnosis not present

## 2021-08-21 DIAGNOSIS — Z20828 Contact with and (suspected) exposure to other viral communicable diseases: Secondary | ICD-10-CM | POA: Diagnosis not present

## 2021-08-24 DIAGNOSIS — N1832 Chronic kidney disease, stage 3b: Secondary | ICD-10-CM | POA: Diagnosis not present

## 2021-08-31 DIAGNOSIS — E875 Hyperkalemia: Secondary | ICD-10-CM | POA: Diagnosis not present

## 2021-09-01 DIAGNOSIS — E875 Hyperkalemia: Secondary | ICD-10-CM | POA: Diagnosis not present

## 2021-09-01 DIAGNOSIS — N1832 Chronic kidney disease, stage 3b: Secondary | ICD-10-CM | POA: Diagnosis not present

## 2021-09-02 DIAGNOSIS — R262 Difficulty in walking, not elsewhere classified: Secondary | ICD-10-CM | POA: Diagnosis not present

## 2021-09-02 DIAGNOSIS — R569 Unspecified convulsions: Secondary | ICD-10-CM | POA: Diagnosis not present

## 2021-09-02 DIAGNOSIS — M1990 Unspecified osteoarthritis, unspecified site: Secondary | ICD-10-CM | POA: Diagnosis not present

## 2021-09-03 DIAGNOSIS — R262 Difficulty in walking, not elsewhere classified: Secondary | ICD-10-CM | POA: Diagnosis not present

## 2021-09-03 DIAGNOSIS — M1990 Unspecified osteoarthritis, unspecified site: Secondary | ICD-10-CM | POA: Diagnosis not present

## 2021-09-03 DIAGNOSIS — R569 Unspecified convulsions: Secondary | ICD-10-CM | POA: Diagnosis not present

## 2021-09-04 DIAGNOSIS — R569 Unspecified convulsions: Secondary | ICD-10-CM | POA: Diagnosis not present

## 2021-09-04 DIAGNOSIS — E875 Hyperkalemia: Secondary | ICD-10-CM | POA: Diagnosis not present

## 2021-09-04 DIAGNOSIS — R262 Difficulty in walking, not elsewhere classified: Secondary | ICD-10-CM | POA: Diagnosis not present

## 2021-09-04 DIAGNOSIS — M1990 Unspecified osteoarthritis, unspecified site: Secondary | ICD-10-CM | POA: Diagnosis not present

## 2021-09-07 DIAGNOSIS — M170 Bilateral primary osteoarthritis of knee: Secondary | ICD-10-CM | POA: Diagnosis not present

## 2021-09-07 DIAGNOSIS — N1832 Chronic kidney disease, stage 3b: Secondary | ICD-10-CM | POA: Diagnosis not present

## 2021-09-07 DIAGNOSIS — R569 Unspecified convulsions: Secondary | ICD-10-CM | POA: Diagnosis not present

## 2021-09-07 DIAGNOSIS — M1990 Unspecified osteoarthritis, unspecified site: Secondary | ICD-10-CM | POA: Diagnosis not present

## 2021-09-07 DIAGNOSIS — Z79899 Other long term (current) drug therapy: Secondary | ICD-10-CM | POA: Diagnosis not present

## 2021-09-07 DIAGNOSIS — R262 Difficulty in walking, not elsewhere classified: Secondary | ICD-10-CM | POA: Diagnosis not present

## 2021-09-08 DIAGNOSIS — R262 Difficulty in walking, not elsewhere classified: Secondary | ICD-10-CM | POA: Diagnosis not present

## 2021-09-08 DIAGNOSIS — R569 Unspecified convulsions: Secondary | ICD-10-CM | POA: Diagnosis not present

## 2021-09-08 DIAGNOSIS — M1990 Unspecified osteoarthritis, unspecified site: Secondary | ICD-10-CM | POA: Diagnosis not present

## 2021-09-09 DIAGNOSIS — R569 Unspecified convulsions: Secondary | ICD-10-CM | POA: Diagnosis not present

## 2021-09-09 DIAGNOSIS — R262 Difficulty in walking, not elsewhere classified: Secondary | ICD-10-CM | POA: Diagnosis not present

## 2021-09-09 DIAGNOSIS — M1990 Unspecified osteoarthritis, unspecified site: Secondary | ICD-10-CM | POA: Diagnosis not present

## 2021-09-10 DIAGNOSIS — M1990 Unspecified osteoarthritis, unspecified site: Secondary | ICD-10-CM | POA: Diagnosis not present

## 2021-09-10 DIAGNOSIS — R262 Difficulty in walking, not elsewhere classified: Secondary | ICD-10-CM | POA: Diagnosis not present

## 2021-09-10 DIAGNOSIS — R569 Unspecified convulsions: Secondary | ICD-10-CM | POA: Diagnosis not present

## 2021-09-11 DIAGNOSIS — R569 Unspecified convulsions: Secondary | ICD-10-CM | POA: Diagnosis not present

## 2021-09-11 DIAGNOSIS — R262 Difficulty in walking, not elsewhere classified: Secondary | ICD-10-CM | POA: Diagnosis not present

## 2021-09-11 DIAGNOSIS — M1990 Unspecified osteoarthritis, unspecified site: Secondary | ICD-10-CM | POA: Diagnosis not present

## 2021-09-14 DIAGNOSIS — N1832 Chronic kidney disease, stage 3b: Secondary | ICD-10-CM | POA: Diagnosis not present

## 2021-09-14 DIAGNOSIS — M1990 Unspecified osteoarthritis, unspecified site: Secondary | ICD-10-CM | POA: Diagnosis not present

## 2021-09-14 DIAGNOSIS — R569 Unspecified convulsions: Secondary | ICD-10-CM | POA: Diagnosis not present

## 2021-09-14 DIAGNOSIS — R262 Difficulty in walking, not elsewhere classified: Secondary | ICD-10-CM | POA: Diagnosis not present

## 2021-09-15 DIAGNOSIS — R262 Difficulty in walking, not elsewhere classified: Secondary | ICD-10-CM | POA: Diagnosis not present

## 2021-09-15 DIAGNOSIS — R569 Unspecified convulsions: Secondary | ICD-10-CM | POA: Diagnosis not present

## 2021-09-15 DIAGNOSIS — M1990 Unspecified osteoarthritis, unspecified site: Secondary | ICD-10-CM | POA: Diagnosis not present

## 2021-09-16 ENCOUNTER — Other Ambulatory Visit: Payer: Self-pay | Admitting: Nephrology

## 2021-09-16 ENCOUNTER — Other Ambulatory Visit (HOSPITAL_COMMUNITY): Payer: Self-pay | Admitting: Nephrology

## 2021-09-16 DIAGNOSIS — E8722 Chronic metabolic acidosis: Secondary | ICD-10-CM | POA: Diagnosis not present

## 2021-09-16 DIAGNOSIS — R262 Difficulty in walking, not elsewhere classified: Secondary | ICD-10-CM | POA: Diagnosis not present

## 2021-09-16 DIAGNOSIS — N170 Acute kidney failure with tubular necrosis: Secondary | ICD-10-CM

## 2021-09-16 DIAGNOSIS — N184 Chronic kidney disease, stage 4 (severe): Secondary | ICD-10-CM

## 2021-09-16 DIAGNOSIS — I129 Hypertensive chronic kidney disease with stage 1 through stage 4 chronic kidney disease, or unspecified chronic kidney disease: Secondary | ICD-10-CM

## 2021-09-16 DIAGNOSIS — M1990 Unspecified osteoarthritis, unspecified site: Secondary | ICD-10-CM | POA: Diagnosis not present

## 2021-09-16 DIAGNOSIS — R569 Unspecified convulsions: Secondary | ICD-10-CM | POA: Diagnosis not present

## 2021-09-16 DIAGNOSIS — E872 Acidosis, unspecified: Secondary | ICD-10-CM

## 2021-09-17 DIAGNOSIS — M1990 Unspecified osteoarthritis, unspecified site: Secondary | ICD-10-CM | POA: Diagnosis not present

## 2021-09-17 DIAGNOSIS — R569 Unspecified convulsions: Secondary | ICD-10-CM | POA: Diagnosis not present

## 2021-09-17 DIAGNOSIS — R262 Difficulty in walking, not elsewhere classified: Secondary | ICD-10-CM | POA: Diagnosis not present

## 2021-09-18 DIAGNOSIS — R262 Difficulty in walking, not elsewhere classified: Secondary | ICD-10-CM | POA: Diagnosis not present

## 2021-09-18 DIAGNOSIS — R569 Unspecified convulsions: Secondary | ICD-10-CM | POA: Diagnosis not present

## 2021-09-18 DIAGNOSIS — M1990 Unspecified osteoarthritis, unspecified site: Secondary | ICD-10-CM | POA: Diagnosis not present

## 2021-09-21 DIAGNOSIS — R262 Difficulty in walking, not elsewhere classified: Secondary | ICD-10-CM | POA: Diagnosis not present

## 2021-09-21 DIAGNOSIS — R569 Unspecified convulsions: Secondary | ICD-10-CM | POA: Diagnosis not present

## 2021-09-21 DIAGNOSIS — M1990 Unspecified osteoarthritis, unspecified site: Secondary | ICD-10-CM | POA: Diagnosis not present

## 2021-09-22 DIAGNOSIS — R262 Difficulty in walking, not elsewhere classified: Secondary | ICD-10-CM | POA: Diagnosis not present

## 2021-09-22 DIAGNOSIS — R569 Unspecified convulsions: Secondary | ICD-10-CM | POA: Diagnosis not present

## 2021-09-22 DIAGNOSIS — M1990 Unspecified osteoarthritis, unspecified site: Secondary | ICD-10-CM | POA: Diagnosis not present

## 2021-09-23 DIAGNOSIS — I10 Essential (primary) hypertension: Secondary | ICD-10-CM | POA: Diagnosis not present

## 2021-09-23 DIAGNOSIS — R262 Difficulty in walking, not elsewhere classified: Secondary | ICD-10-CM | POA: Diagnosis not present

## 2021-09-23 DIAGNOSIS — E039 Hypothyroidism, unspecified: Secondary | ICD-10-CM | POA: Diagnosis not present

## 2021-09-23 DIAGNOSIS — E785 Hyperlipidemia, unspecified: Secondary | ICD-10-CM | POA: Diagnosis not present

## 2021-09-23 DIAGNOSIS — R569 Unspecified convulsions: Secondary | ICD-10-CM | POA: Diagnosis not present

## 2021-09-23 DIAGNOSIS — N1832 Chronic kidney disease, stage 3b: Secondary | ICD-10-CM | POA: Diagnosis not present

## 2021-09-23 DIAGNOSIS — M1990 Unspecified osteoarthritis, unspecified site: Secondary | ICD-10-CM | POA: Diagnosis not present

## 2021-09-24 DIAGNOSIS — M1990 Unspecified osteoarthritis, unspecified site: Secondary | ICD-10-CM | POA: Diagnosis not present

## 2021-09-24 DIAGNOSIS — R569 Unspecified convulsions: Secondary | ICD-10-CM | POA: Diagnosis not present

## 2021-09-24 DIAGNOSIS — R262 Difficulty in walking, not elsewhere classified: Secondary | ICD-10-CM | POA: Diagnosis not present

## 2021-09-25 DIAGNOSIS — R569 Unspecified convulsions: Secondary | ICD-10-CM | POA: Diagnosis not present

## 2021-09-25 DIAGNOSIS — M1990 Unspecified osteoarthritis, unspecified site: Secondary | ICD-10-CM | POA: Diagnosis not present

## 2021-09-25 DIAGNOSIS — R262 Difficulty in walking, not elsewhere classified: Secondary | ICD-10-CM | POA: Diagnosis not present

## 2021-09-28 DIAGNOSIS — R569 Unspecified convulsions: Secondary | ICD-10-CM | POA: Diagnosis not present

## 2021-09-28 DIAGNOSIS — R262 Difficulty in walking, not elsewhere classified: Secondary | ICD-10-CM | POA: Diagnosis not present

## 2021-09-28 DIAGNOSIS — M1990 Unspecified osteoarthritis, unspecified site: Secondary | ICD-10-CM | POA: Diagnosis not present

## 2021-09-29 DIAGNOSIS — R262 Difficulty in walking, not elsewhere classified: Secondary | ICD-10-CM | POA: Diagnosis not present

## 2021-09-29 DIAGNOSIS — M1990 Unspecified osteoarthritis, unspecified site: Secondary | ICD-10-CM | POA: Diagnosis not present

## 2021-09-29 DIAGNOSIS — R569 Unspecified convulsions: Secondary | ICD-10-CM | POA: Diagnosis not present

## 2021-10-01 ENCOUNTER — Other Ambulatory Visit: Payer: Self-pay

## 2021-10-01 ENCOUNTER — Ambulatory Visit (HOSPITAL_COMMUNITY)
Admission: RE | Admit: 2021-10-01 | Discharge: 2021-10-01 | Disposition: A | Discharge status: Home / Self Care | Payer: Medicare Other | Source: Ambulatory Visit | Attending: Nephrology | Admitting: Nephrology

## 2021-10-01 DIAGNOSIS — N184 Chronic kidney disease, stage 4 (severe): Secondary | ICD-10-CM | POA: Diagnosis not present

## 2021-10-01 DIAGNOSIS — E872 Acidosis, unspecified: Secondary | ICD-10-CM | POA: Insufficient documentation

## 2021-10-01 DIAGNOSIS — N170 Acute kidney failure with tubular necrosis: Secondary | ICD-10-CM | POA: Insufficient documentation

## 2021-10-01 DIAGNOSIS — I129 Hypertensive chronic kidney disease with stage 1 through stage 4 chronic kidney disease, or unspecified chronic kidney disease: Secondary | ICD-10-CM | POA: Diagnosis not present

## 2021-10-01 DIAGNOSIS — Z79899 Other long term (current) drug therapy: Secondary | ICD-10-CM | POA: Diagnosis not present

## 2021-10-01 DIAGNOSIS — N179 Acute kidney failure, unspecified: Secondary | ICD-10-CM | POA: Diagnosis not present

## 2021-10-01 DIAGNOSIS — R112 Nausea with vomiting, unspecified: Secondary | ICD-10-CM | POA: Diagnosis not present

## 2021-10-02 DIAGNOSIS — G8929 Other chronic pain: Secondary | ICD-10-CM | POA: Diagnosis not present

## 2021-10-02 DIAGNOSIS — Z79899 Other long term (current) drug therapy: Secondary | ICD-10-CM | POA: Diagnosis not present

## 2021-10-02 DIAGNOSIS — M25511 Pain in right shoulder: Secondary | ICD-10-CM | POA: Diagnosis not present

## 2021-10-05 DIAGNOSIS — R143 Flatulence: Secondary | ICD-10-CM | POA: Diagnosis not present

## 2021-10-05 DIAGNOSIS — R112 Nausea with vomiting, unspecified: Secondary | ICD-10-CM | POA: Diagnosis not present

## 2021-10-06 DIAGNOSIS — E875 Hyperkalemia: Secondary | ICD-10-CM | POA: Diagnosis not present

## 2021-10-06 DIAGNOSIS — I10 Essential (primary) hypertension: Secondary | ICD-10-CM | POA: Diagnosis not present

## 2021-10-06 DIAGNOSIS — K5900 Constipation, unspecified: Secondary | ICD-10-CM | POA: Diagnosis not present

## 2021-10-06 DIAGNOSIS — N184 Chronic kidney disease, stage 4 (severe): Secondary | ICD-10-CM | POA: Diagnosis not present

## 2021-10-12 DIAGNOSIS — E559 Vitamin D deficiency, unspecified: Secondary | ICD-10-CM | POA: Diagnosis not present

## 2021-10-12 DIAGNOSIS — E875 Hyperkalemia: Secondary | ICD-10-CM | POA: Diagnosis not present

## 2021-10-12 DIAGNOSIS — Z79899 Other long term (current) drug therapy: Secondary | ICD-10-CM | POA: Diagnosis not present

## 2021-10-12 DIAGNOSIS — N1832 Chronic kidney disease, stage 3b: Secondary | ICD-10-CM | POA: Diagnosis not present

## 2021-10-12 DIAGNOSIS — E871 Hypo-osmolality and hyponatremia: Secondary | ICD-10-CM | POA: Diagnosis not present

## 2021-10-12 DIAGNOSIS — Z129 Encounter for screening for malignant neoplasm, site unspecified: Secondary | ICD-10-CM | POA: Diagnosis not present

## 2021-10-21 DIAGNOSIS — N170 Acute kidney failure with tubular necrosis: Secondary | ICD-10-CM | POA: Diagnosis not present

## 2021-10-21 DIAGNOSIS — I129 Hypertensive chronic kidney disease with stage 1 through stage 4 chronic kidney disease, or unspecified chronic kidney disease: Secondary | ICD-10-CM | POA: Diagnosis not present

## 2021-10-21 DIAGNOSIS — N184 Chronic kidney disease, stage 4 (severe): Secondary | ICD-10-CM | POA: Diagnosis not present

## 2021-10-26 DIAGNOSIS — R269 Unspecified abnormalities of gait and mobility: Secondary | ICD-10-CM | POA: Diagnosis not present

## 2021-10-26 DIAGNOSIS — R569 Unspecified convulsions: Secondary | ICD-10-CM | POA: Diagnosis not present

## 2021-10-30 DIAGNOSIS — Z79899 Other long term (current) drug therapy: Secondary | ICD-10-CM | POA: Diagnosis not present

## 2021-11-02 DIAGNOSIS — E785 Hyperlipidemia, unspecified: Secondary | ICD-10-CM | POA: Diagnosis not present

## 2021-11-02 DIAGNOSIS — I10 Essential (primary) hypertension: Secondary | ICD-10-CM | POA: Diagnosis not present

## 2021-11-02 DIAGNOSIS — Z79899 Other long term (current) drug therapy: Secondary | ICD-10-CM | POA: Diagnosis not present

## 2021-11-02 DIAGNOSIS — I639 Cerebral infarction, unspecified: Secondary | ICD-10-CM | POA: Diagnosis not present

## 2021-11-16 DIAGNOSIS — E785 Hyperlipidemia, unspecified: Secondary | ICD-10-CM | POA: Diagnosis not present

## 2021-11-16 DIAGNOSIS — E039 Hypothyroidism, unspecified: Secondary | ICD-10-CM | POA: Diagnosis not present

## 2021-11-17 DIAGNOSIS — Z79899 Other long term (current) drug therapy: Secondary | ICD-10-CM | POA: Diagnosis not present

## 2021-11-17 DIAGNOSIS — E785 Hyperlipidemia, unspecified: Secondary | ICD-10-CM | POA: Diagnosis not present

## 2021-11-23 DIAGNOSIS — N1832 Chronic kidney disease, stage 3b: Secondary | ICD-10-CM | POA: Diagnosis not present

## 2021-11-23 DIAGNOSIS — I10 Essential (primary) hypertension: Secondary | ICD-10-CM | POA: Diagnosis not present

## 2021-11-23 DIAGNOSIS — E785 Hyperlipidemia, unspecified: Secondary | ICD-10-CM | POA: Diagnosis not present

## 2021-12-02 DIAGNOSIS — R569 Unspecified convulsions: Secondary | ICD-10-CM | POA: Diagnosis not present

## 2021-12-02 DIAGNOSIS — R262 Difficulty in walking, not elsewhere classified: Secondary | ICD-10-CM | POA: Diagnosis not present

## 2021-12-02 DIAGNOSIS — M170 Bilateral primary osteoarthritis of knee: Secondary | ICD-10-CM | POA: Diagnosis not present

## 2021-12-03 DIAGNOSIS — R569 Unspecified convulsions: Secondary | ICD-10-CM | POA: Diagnosis not present

## 2021-12-03 DIAGNOSIS — R262 Difficulty in walking, not elsewhere classified: Secondary | ICD-10-CM | POA: Diagnosis not present

## 2021-12-03 DIAGNOSIS — M170 Bilateral primary osteoarthritis of knee: Secondary | ICD-10-CM | POA: Diagnosis not present

## 2021-12-04 DIAGNOSIS — M170 Bilateral primary osteoarthritis of knee: Secondary | ICD-10-CM | POA: Diagnosis not present

## 2021-12-04 DIAGNOSIS — R569 Unspecified convulsions: Secondary | ICD-10-CM | POA: Diagnosis not present

## 2021-12-04 DIAGNOSIS — R262 Difficulty in walking, not elsewhere classified: Secondary | ICD-10-CM | POA: Diagnosis not present

## 2021-12-04 DIAGNOSIS — N1832 Chronic kidney disease, stage 3b: Secondary | ICD-10-CM | POA: Diagnosis not present

## 2021-12-07 DIAGNOSIS — M170 Bilateral primary osteoarthritis of knee: Secondary | ICD-10-CM | POA: Diagnosis not present

## 2021-12-07 DIAGNOSIS — E871 Hypo-osmolality and hyponatremia: Secondary | ICD-10-CM | POA: Diagnosis not present

## 2021-12-07 DIAGNOSIS — R569 Unspecified convulsions: Secondary | ICD-10-CM | POA: Diagnosis not present

## 2021-12-07 DIAGNOSIS — E875 Hyperkalemia: Secondary | ICD-10-CM | POA: Diagnosis not present

## 2021-12-07 DIAGNOSIS — Z20822 Contact with and (suspected) exposure to covid-19: Secondary | ICD-10-CM | POA: Diagnosis not present

## 2021-12-07 DIAGNOSIS — N1832 Chronic kidney disease, stage 3b: Secondary | ICD-10-CM | POA: Diagnosis not present

## 2021-12-07 DIAGNOSIS — R262 Difficulty in walking, not elsewhere classified: Secondary | ICD-10-CM | POA: Diagnosis not present

## 2021-12-09 DIAGNOSIS — M170 Bilateral primary osteoarthritis of knee: Secondary | ICD-10-CM | POA: Diagnosis not present

## 2021-12-09 DIAGNOSIS — R569 Unspecified convulsions: Secondary | ICD-10-CM | POA: Diagnosis not present

## 2021-12-09 DIAGNOSIS — N1832 Chronic kidney disease, stage 3b: Secondary | ICD-10-CM | POA: Diagnosis not present

## 2021-12-09 DIAGNOSIS — R262 Difficulty in walking, not elsewhere classified: Secondary | ICD-10-CM | POA: Diagnosis not present

## 2021-12-10 DIAGNOSIS — E875 Hyperkalemia: Secondary | ICD-10-CM | POA: Diagnosis not present

## 2021-12-10 DIAGNOSIS — R569 Unspecified convulsions: Secondary | ICD-10-CM | POA: Diagnosis not present

## 2021-12-10 DIAGNOSIS — E871 Hypo-osmolality and hyponatremia: Secondary | ICD-10-CM | POA: Diagnosis not present

## 2021-12-10 DIAGNOSIS — R262 Difficulty in walking, not elsewhere classified: Secondary | ICD-10-CM | POA: Diagnosis not present

## 2021-12-10 DIAGNOSIS — M170 Bilateral primary osteoarthritis of knee: Secondary | ICD-10-CM | POA: Diagnosis not present

## 2021-12-11 DIAGNOSIS — R569 Unspecified convulsions: Secondary | ICD-10-CM | POA: Diagnosis not present

## 2021-12-11 DIAGNOSIS — M170 Bilateral primary osteoarthritis of knee: Secondary | ICD-10-CM | POA: Diagnosis not present

## 2021-12-11 DIAGNOSIS — R262 Difficulty in walking, not elsewhere classified: Secondary | ICD-10-CM | POA: Diagnosis not present

## 2021-12-14 DIAGNOSIS — R569 Unspecified convulsions: Secondary | ICD-10-CM | POA: Diagnosis not present

## 2021-12-14 DIAGNOSIS — M170 Bilateral primary osteoarthritis of knee: Secondary | ICD-10-CM | POA: Diagnosis not present

## 2021-12-14 DIAGNOSIS — R262 Difficulty in walking, not elsewhere classified: Secondary | ICD-10-CM | POA: Diagnosis not present

## 2021-12-15 DIAGNOSIS — M170 Bilateral primary osteoarthritis of knee: Secondary | ICD-10-CM | POA: Diagnosis not present

## 2021-12-15 DIAGNOSIS — R262 Difficulty in walking, not elsewhere classified: Secondary | ICD-10-CM | POA: Diagnosis not present

## 2021-12-15 DIAGNOSIS — R569 Unspecified convulsions: Secondary | ICD-10-CM | POA: Diagnosis not present

## 2021-12-16 DIAGNOSIS — R262 Difficulty in walking, not elsewhere classified: Secondary | ICD-10-CM | POA: Diagnosis not present

## 2021-12-16 DIAGNOSIS — M170 Bilateral primary osteoarthritis of knee: Secondary | ICD-10-CM | POA: Diagnosis not present

## 2021-12-16 DIAGNOSIS — E871 Hypo-osmolality and hyponatremia: Secondary | ICD-10-CM | POA: Diagnosis not present

## 2021-12-16 DIAGNOSIS — R569 Unspecified convulsions: Secondary | ICD-10-CM | POA: Diagnosis not present

## 2021-12-17 DIAGNOSIS — R262 Difficulty in walking, not elsewhere classified: Secondary | ICD-10-CM | POA: Diagnosis not present

## 2021-12-17 DIAGNOSIS — M170 Bilateral primary osteoarthritis of knee: Secondary | ICD-10-CM | POA: Diagnosis not present

## 2021-12-17 DIAGNOSIS — R569 Unspecified convulsions: Secondary | ICD-10-CM | POA: Diagnosis not present

## 2021-12-18 DIAGNOSIS — M170 Bilateral primary osteoarthritis of knee: Secondary | ICD-10-CM | POA: Diagnosis not present

## 2021-12-18 DIAGNOSIS — R262 Difficulty in walking, not elsewhere classified: Secondary | ICD-10-CM | POA: Diagnosis not present

## 2021-12-18 DIAGNOSIS — R569 Unspecified convulsions: Secondary | ICD-10-CM | POA: Diagnosis not present

## 2021-12-21 DIAGNOSIS — R569 Unspecified convulsions: Secondary | ICD-10-CM | POA: Diagnosis not present

## 2021-12-21 DIAGNOSIS — Z79899 Other long term (current) drug therapy: Secondary | ICD-10-CM | POA: Diagnosis not present

## 2021-12-21 DIAGNOSIS — R262 Difficulty in walking, not elsewhere classified: Secondary | ICD-10-CM | POA: Diagnosis not present

## 2021-12-21 DIAGNOSIS — E871 Hypo-osmolality and hyponatremia: Secondary | ICD-10-CM | POA: Diagnosis not present

## 2021-12-21 DIAGNOSIS — M170 Bilateral primary osteoarthritis of knee: Secondary | ICD-10-CM | POA: Diagnosis not present

## 2021-12-21 DIAGNOSIS — Z20822 Contact with and (suspected) exposure to covid-19: Secondary | ICD-10-CM | POA: Diagnosis not present

## 2021-12-22 DIAGNOSIS — R262 Difficulty in walking, not elsewhere classified: Secondary | ICD-10-CM | POA: Diagnosis not present

## 2021-12-22 DIAGNOSIS — R569 Unspecified convulsions: Secondary | ICD-10-CM | POA: Diagnosis not present

## 2021-12-22 DIAGNOSIS — M170 Bilateral primary osteoarthritis of knee: Secondary | ICD-10-CM | POA: Diagnosis not present

## 2021-12-23 DIAGNOSIS — I6930 Unspecified sequelae of cerebral infarction: Secondary | ICD-10-CM | POA: Diagnosis not present

## 2021-12-23 DIAGNOSIS — R569 Unspecified convulsions: Secondary | ICD-10-CM | POA: Diagnosis not present

## 2021-12-23 DIAGNOSIS — R262 Difficulty in walking, not elsewhere classified: Secondary | ICD-10-CM | POA: Diagnosis not present

## 2021-12-23 DIAGNOSIS — R269 Unspecified abnormalities of gait and mobility: Secondary | ICD-10-CM | POA: Diagnosis not present

## 2021-12-23 DIAGNOSIS — M170 Bilateral primary osteoarthritis of knee: Secondary | ICD-10-CM | POA: Diagnosis not present

## 2021-12-24 DIAGNOSIS — I129 Hypertensive chronic kidney disease with stage 1 through stage 4 chronic kidney disease, or unspecified chronic kidney disease: Secondary | ICD-10-CM | POA: Diagnosis not present

## 2021-12-24 DIAGNOSIS — R262 Difficulty in walking, not elsewhere classified: Secondary | ICD-10-CM | POA: Diagnosis not present

## 2021-12-24 DIAGNOSIS — N1831 Chronic kidney disease, stage 3a: Secondary | ICD-10-CM | POA: Diagnosis not present

## 2021-12-24 DIAGNOSIS — E871 Hypo-osmolality and hyponatremia: Secondary | ICD-10-CM | POA: Diagnosis not present

## 2021-12-24 DIAGNOSIS — E875 Hyperkalemia: Secondary | ICD-10-CM | POA: Diagnosis not present

## 2021-12-24 DIAGNOSIS — M170 Bilateral primary osteoarthritis of knee: Secondary | ICD-10-CM | POA: Diagnosis not present

## 2021-12-24 DIAGNOSIS — N170 Acute kidney failure with tubular necrosis: Secondary | ICD-10-CM | POA: Diagnosis not present

## 2021-12-24 DIAGNOSIS — R569 Unspecified convulsions: Secondary | ICD-10-CM | POA: Diagnosis not present

## 2021-12-25 DIAGNOSIS — R569 Unspecified convulsions: Secondary | ICD-10-CM | POA: Diagnosis not present

## 2021-12-25 DIAGNOSIS — Z20822 Contact with and (suspected) exposure to covid-19: Secondary | ICD-10-CM | POA: Diagnosis not present

## 2021-12-25 DIAGNOSIS — M170 Bilateral primary osteoarthritis of knee: Secondary | ICD-10-CM | POA: Diagnosis not present

## 2021-12-25 DIAGNOSIS — R262 Difficulty in walking, not elsewhere classified: Secondary | ICD-10-CM | POA: Diagnosis not present

## 2021-12-28 DIAGNOSIS — E039 Hypothyroidism, unspecified: Secondary | ICD-10-CM | POA: Diagnosis not present

## 2021-12-28 DIAGNOSIS — N1832 Chronic kidney disease, stage 3b: Secondary | ICD-10-CM | POA: Diagnosis not present

## 2021-12-29 DIAGNOSIS — Z79899 Other long term (current) drug therapy: Secondary | ICD-10-CM | POA: Diagnosis not present

## 2021-12-29 DIAGNOSIS — E039 Hypothyroidism, unspecified: Secondary | ICD-10-CM | POA: Diagnosis not present

## 2022-01-05 DIAGNOSIS — I10 Essential (primary) hypertension: Secondary | ICD-10-CM | POA: Diagnosis not present

## 2022-01-05 DIAGNOSIS — N1832 Chronic kidney disease, stage 3b: Secondary | ICD-10-CM | POA: Diagnosis not present

## 2022-01-05 DIAGNOSIS — Z79899 Other long term (current) drug therapy: Secondary | ICD-10-CM | POA: Diagnosis not present

## 2022-01-11 DIAGNOSIS — M170 Bilateral primary osteoarthritis of knee: Secondary | ICD-10-CM | POA: Diagnosis not present

## 2022-01-11 DIAGNOSIS — R1319 Other dysphagia: Secondary | ICD-10-CM | POA: Diagnosis not present

## 2022-01-11 DIAGNOSIS — R262 Difficulty in walking, not elsewhere classified: Secondary | ICD-10-CM | POA: Diagnosis not present

## 2022-01-11 DIAGNOSIS — M6281 Muscle weakness (generalized): Secondary | ICD-10-CM | POA: Diagnosis not present

## 2022-01-11 DIAGNOSIS — R569 Unspecified convulsions: Secondary | ICD-10-CM | POA: Diagnosis not present

## 2022-01-12 DIAGNOSIS — Z79899 Other long term (current) drug therapy: Secondary | ICD-10-CM | POA: Diagnosis not present

## 2022-01-12 DIAGNOSIS — R262 Difficulty in walking, not elsewhere classified: Secondary | ICD-10-CM | POA: Diagnosis not present

## 2022-01-12 DIAGNOSIS — R1319 Other dysphagia: Secondary | ICD-10-CM | POA: Diagnosis not present

## 2022-01-12 DIAGNOSIS — M170 Bilateral primary osteoarthritis of knee: Secondary | ICD-10-CM | POA: Diagnosis not present

## 2022-01-12 DIAGNOSIS — M6281 Muscle weakness (generalized): Secondary | ICD-10-CM | POA: Diagnosis not present

## 2022-01-12 DIAGNOSIS — R569 Unspecified convulsions: Secondary | ICD-10-CM | POA: Diagnosis not present

## 2022-01-13 DIAGNOSIS — M6281 Muscle weakness (generalized): Secondary | ICD-10-CM | POA: Diagnosis not present

## 2022-01-13 DIAGNOSIS — M170 Bilateral primary osteoarthritis of knee: Secondary | ICD-10-CM | POA: Diagnosis not present

## 2022-01-13 DIAGNOSIS — R569 Unspecified convulsions: Secondary | ICD-10-CM | POA: Diagnosis not present

## 2022-01-13 DIAGNOSIS — R1319 Other dysphagia: Secondary | ICD-10-CM | POA: Diagnosis not present

## 2022-01-13 DIAGNOSIS — R262 Difficulty in walking, not elsewhere classified: Secondary | ICD-10-CM | POA: Diagnosis not present

## 2022-01-14 DIAGNOSIS — R1319 Other dysphagia: Secondary | ICD-10-CM | POA: Diagnosis not present

## 2022-01-14 DIAGNOSIS — R569 Unspecified convulsions: Secondary | ICD-10-CM | POA: Diagnosis not present

## 2022-01-14 DIAGNOSIS — M170 Bilateral primary osteoarthritis of knee: Secondary | ICD-10-CM | POA: Diagnosis not present

## 2022-01-14 DIAGNOSIS — R262 Difficulty in walking, not elsewhere classified: Secondary | ICD-10-CM | POA: Diagnosis not present

## 2022-01-14 DIAGNOSIS — M6281 Muscle weakness (generalized): Secondary | ICD-10-CM | POA: Diagnosis not present

## 2022-01-14 DIAGNOSIS — Z79899 Other long term (current) drug therapy: Secondary | ICD-10-CM | POA: Diagnosis not present

## 2022-01-14 DIAGNOSIS — K30 Functional dyspepsia: Secondary | ICD-10-CM | POA: Diagnosis not present

## 2022-01-15 DIAGNOSIS — M6281 Muscle weakness (generalized): Secondary | ICD-10-CM | POA: Diagnosis not present

## 2022-01-15 DIAGNOSIS — M170 Bilateral primary osteoarthritis of knee: Secondary | ICD-10-CM | POA: Diagnosis not present

## 2022-01-15 DIAGNOSIS — R569 Unspecified convulsions: Secondary | ICD-10-CM | POA: Diagnosis not present

## 2022-01-15 DIAGNOSIS — R262 Difficulty in walking, not elsewhere classified: Secondary | ICD-10-CM | POA: Diagnosis not present

## 2022-01-15 DIAGNOSIS — R1319 Other dysphagia: Secondary | ICD-10-CM | POA: Diagnosis not present

## 2022-01-16 DIAGNOSIS — R262 Difficulty in walking, not elsewhere classified: Secondary | ICD-10-CM | POA: Diagnosis not present

## 2022-01-16 DIAGNOSIS — M6281 Muscle weakness (generalized): Secondary | ICD-10-CM | POA: Diagnosis not present

## 2022-01-16 DIAGNOSIS — R1319 Other dysphagia: Secondary | ICD-10-CM | POA: Diagnosis not present

## 2022-01-16 DIAGNOSIS — R569 Unspecified convulsions: Secondary | ICD-10-CM | POA: Diagnosis not present

## 2022-01-16 DIAGNOSIS — M170 Bilateral primary osteoarthritis of knee: Secondary | ICD-10-CM | POA: Diagnosis not present

## 2022-01-18 DIAGNOSIS — Z20822 Contact with and (suspected) exposure to covid-19: Secondary | ICD-10-CM | POA: Diagnosis not present

## 2022-01-18 DIAGNOSIS — R569 Unspecified convulsions: Secondary | ICD-10-CM | POA: Diagnosis not present

## 2022-01-18 DIAGNOSIS — M170 Bilateral primary osteoarthritis of knee: Secondary | ICD-10-CM | POA: Diagnosis not present

## 2022-01-18 DIAGNOSIS — R262 Difficulty in walking, not elsewhere classified: Secondary | ICD-10-CM | POA: Diagnosis not present

## 2022-01-18 DIAGNOSIS — R1319 Other dysphagia: Secondary | ICD-10-CM | POA: Diagnosis not present

## 2022-01-18 DIAGNOSIS — M6281 Muscle weakness (generalized): Secondary | ICD-10-CM | POA: Diagnosis not present

## 2022-01-19 DIAGNOSIS — M6281 Muscle weakness (generalized): Secondary | ICD-10-CM | POA: Diagnosis not present

## 2022-01-19 DIAGNOSIS — M170 Bilateral primary osteoarthritis of knee: Secondary | ICD-10-CM | POA: Diagnosis not present

## 2022-01-19 DIAGNOSIS — R262 Difficulty in walking, not elsewhere classified: Secondary | ICD-10-CM | POA: Diagnosis not present

## 2022-01-19 DIAGNOSIS — R569 Unspecified convulsions: Secondary | ICD-10-CM | POA: Diagnosis not present

## 2022-01-19 DIAGNOSIS — R1319 Other dysphagia: Secondary | ICD-10-CM | POA: Diagnosis not present

## 2022-01-20 DIAGNOSIS — R262 Difficulty in walking, not elsewhere classified: Secondary | ICD-10-CM | POA: Diagnosis not present

## 2022-01-20 DIAGNOSIS — M6281 Muscle weakness (generalized): Secondary | ICD-10-CM | POA: Diagnosis not present

## 2022-01-20 DIAGNOSIS — R1319 Other dysphagia: Secondary | ICD-10-CM | POA: Diagnosis not present

## 2022-01-20 DIAGNOSIS — M170 Bilateral primary osteoarthritis of knee: Secondary | ICD-10-CM | POA: Diagnosis not present

## 2022-01-20 DIAGNOSIS — R569 Unspecified convulsions: Secondary | ICD-10-CM | POA: Diagnosis not present

## 2022-01-21 DIAGNOSIS — R262 Difficulty in walking, not elsewhere classified: Secondary | ICD-10-CM | POA: Diagnosis not present

## 2022-01-21 DIAGNOSIS — R1319 Other dysphagia: Secondary | ICD-10-CM | POA: Diagnosis not present

## 2022-01-21 DIAGNOSIS — R569 Unspecified convulsions: Secondary | ICD-10-CM | POA: Diagnosis not present

## 2022-01-21 DIAGNOSIS — M6281 Muscle weakness (generalized): Secondary | ICD-10-CM | POA: Diagnosis not present

## 2022-01-21 DIAGNOSIS — M170 Bilateral primary osteoarthritis of knee: Secondary | ICD-10-CM | POA: Diagnosis not present

## 2022-01-22 DIAGNOSIS — M6281 Muscle weakness (generalized): Secondary | ICD-10-CM | POA: Diagnosis not present

## 2022-01-22 DIAGNOSIS — M170 Bilateral primary osteoarthritis of knee: Secondary | ICD-10-CM | POA: Diagnosis not present

## 2022-01-22 DIAGNOSIS — R1319 Other dysphagia: Secondary | ICD-10-CM | POA: Diagnosis not present

## 2022-01-22 DIAGNOSIS — R569 Unspecified convulsions: Secondary | ICD-10-CM | POA: Diagnosis not present

## 2022-01-22 DIAGNOSIS — R262 Difficulty in walking, not elsewhere classified: Secondary | ICD-10-CM | POA: Diagnosis not present

## 2022-01-25 DIAGNOSIS — R569 Unspecified convulsions: Secondary | ICD-10-CM | POA: Diagnosis not present

## 2022-01-25 DIAGNOSIS — E039 Hypothyroidism, unspecified: Secondary | ICD-10-CM | POA: Diagnosis not present

## 2022-01-25 DIAGNOSIS — M6281 Muscle weakness (generalized): Secondary | ICD-10-CM | POA: Diagnosis not present

## 2022-01-25 DIAGNOSIS — M170 Bilateral primary osteoarthritis of knee: Secondary | ICD-10-CM | POA: Diagnosis not present

## 2022-01-25 DIAGNOSIS — R1319 Other dysphagia: Secondary | ICD-10-CM | POA: Diagnosis not present

## 2022-01-25 DIAGNOSIS — R262 Difficulty in walking, not elsewhere classified: Secondary | ICD-10-CM | POA: Diagnosis not present

## 2022-01-26 DIAGNOSIS — R569 Unspecified convulsions: Secondary | ICD-10-CM | POA: Diagnosis not present

## 2022-01-26 DIAGNOSIS — R1319 Other dysphagia: Secondary | ICD-10-CM | POA: Diagnosis not present

## 2022-01-26 DIAGNOSIS — M6281 Muscle weakness (generalized): Secondary | ICD-10-CM | POA: Diagnosis not present

## 2022-01-26 DIAGNOSIS — M170 Bilateral primary osteoarthritis of knee: Secondary | ICD-10-CM | POA: Diagnosis not present

## 2022-01-26 DIAGNOSIS — R262 Difficulty in walking, not elsewhere classified: Secondary | ICD-10-CM | POA: Diagnosis not present

## 2022-01-27 DIAGNOSIS — R1319 Other dysphagia: Secondary | ICD-10-CM | POA: Diagnosis not present

## 2022-01-27 DIAGNOSIS — R569 Unspecified convulsions: Secondary | ICD-10-CM | POA: Diagnosis not present

## 2022-01-27 DIAGNOSIS — M6281 Muscle weakness (generalized): Secondary | ICD-10-CM | POA: Diagnosis not present

## 2022-01-27 DIAGNOSIS — E785 Hyperlipidemia, unspecified: Secondary | ICD-10-CM | POA: Diagnosis not present

## 2022-01-27 DIAGNOSIS — I10 Essential (primary) hypertension: Secondary | ICD-10-CM | POA: Diagnosis not present

## 2022-01-27 DIAGNOSIS — R262 Difficulty in walking, not elsewhere classified: Secondary | ICD-10-CM | POA: Diagnosis not present

## 2022-01-27 DIAGNOSIS — E039 Hypothyroidism, unspecified: Secondary | ICD-10-CM | POA: Diagnosis not present

## 2022-01-27 DIAGNOSIS — M170 Bilateral primary osteoarthritis of knee: Secondary | ICD-10-CM | POA: Diagnosis not present

## 2022-01-28 DIAGNOSIS — R569 Unspecified convulsions: Secondary | ICD-10-CM | POA: Diagnosis not present

## 2022-01-28 DIAGNOSIS — M6281 Muscle weakness (generalized): Secondary | ICD-10-CM | POA: Diagnosis not present

## 2022-01-28 DIAGNOSIS — R1319 Other dysphagia: Secondary | ICD-10-CM | POA: Diagnosis not present

## 2022-01-28 DIAGNOSIS — M170 Bilateral primary osteoarthritis of knee: Secondary | ICD-10-CM | POA: Diagnosis not present

## 2022-01-28 DIAGNOSIS — R262 Difficulty in walking, not elsewhere classified: Secondary | ICD-10-CM | POA: Diagnosis not present

## 2022-01-29 DIAGNOSIS — R262 Difficulty in walking, not elsewhere classified: Secondary | ICD-10-CM | POA: Diagnosis not present

## 2022-01-29 DIAGNOSIS — M6281 Muscle weakness (generalized): Secondary | ICD-10-CM | POA: Diagnosis not present

## 2022-01-29 DIAGNOSIS — M170 Bilateral primary osteoarthritis of knee: Secondary | ICD-10-CM | POA: Diagnosis not present

## 2022-01-29 DIAGNOSIS — R1319 Other dysphagia: Secondary | ICD-10-CM | POA: Diagnosis not present

## 2022-01-29 DIAGNOSIS — R569 Unspecified convulsions: Secondary | ICD-10-CM | POA: Diagnosis not present

## 2022-02-01 DIAGNOSIS — R569 Unspecified convulsions: Secondary | ICD-10-CM | POA: Diagnosis not present

## 2022-02-01 DIAGNOSIS — M6281 Muscle weakness (generalized): Secondary | ICD-10-CM | POA: Diagnosis not present

## 2022-02-01 DIAGNOSIS — R1319 Other dysphagia: Secondary | ICD-10-CM | POA: Diagnosis not present

## 2022-02-01 DIAGNOSIS — R262 Difficulty in walking, not elsewhere classified: Secondary | ICD-10-CM | POA: Diagnosis not present

## 2022-02-01 DIAGNOSIS — M170 Bilateral primary osteoarthritis of knee: Secondary | ICD-10-CM | POA: Diagnosis not present

## 2022-02-02 DIAGNOSIS — R262 Difficulty in walking, not elsewhere classified: Secondary | ICD-10-CM | POA: Diagnosis not present

## 2022-02-02 DIAGNOSIS — R1319 Other dysphagia: Secondary | ICD-10-CM | POA: Diagnosis not present

## 2022-02-02 DIAGNOSIS — M170 Bilateral primary osteoarthritis of knee: Secondary | ICD-10-CM | POA: Diagnosis not present

## 2022-02-02 DIAGNOSIS — R569 Unspecified convulsions: Secondary | ICD-10-CM | POA: Diagnosis not present

## 2022-02-02 DIAGNOSIS — M6281 Muscle weakness (generalized): Secondary | ICD-10-CM | POA: Diagnosis not present

## 2022-02-03 DIAGNOSIS — R1319 Other dysphagia: Secondary | ICD-10-CM | POA: Diagnosis not present

## 2022-02-03 DIAGNOSIS — M170 Bilateral primary osteoarthritis of knee: Secondary | ICD-10-CM | POA: Diagnosis not present

## 2022-02-03 DIAGNOSIS — M6281 Muscle weakness (generalized): Secondary | ICD-10-CM | POA: Diagnosis not present

## 2022-02-03 DIAGNOSIS — R569 Unspecified convulsions: Secondary | ICD-10-CM | POA: Diagnosis not present

## 2022-02-03 DIAGNOSIS — R262 Difficulty in walking, not elsewhere classified: Secondary | ICD-10-CM | POA: Diagnosis not present

## 2022-02-04 DIAGNOSIS — R1319 Other dysphagia: Secondary | ICD-10-CM | POA: Diagnosis not present

## 2022-02-04 DIAGNOSIS — R262 Difficulty in walking, not elsewhere classified: Secondary | ICD-10-CM | POA: Diagnosis not present

## 2022-02-04 DIAGNOSIS — E785 Hyperlipidemia, unspecified: Secondary | ICD-10-CM | POA: Diagnosis not present

## 2022-02-04 DIAGNOSIS — M6281 Muscle weakness (generalized): Secondary | ICD-10-CM | POA: Diagnosis not present

## 2022-02-04 DIAGNOSIS — E039 Hypothyroidism, unspecified: Secondary | ICD-10-CM | POA: Diagnosis not present

## 2022-02-04 DIAGNOSIS — M170 Bilateral primary osteoarthritis of knee: Secondary | ICD-10-CM | POA: Diagnosis not present

## 2022-02-04 DIAGNOSIS — R569 Unspecified convulsions: Secondary | ICD-10-CM | POA: Diagnosis not present

## 2022-02-04 DIAGNOSIS — Z79899 Other long term (current) drug therapy: Secondary | ICD-10-CM | POA: Diagnosis not present

## 2022-02-05 DIAGNOSIS — M170 Bilateral primary osteoarthritis of knee: Secondary | ICD-10-CM | POA: Diagnosis not present

## 2022-02-05 DIAGNOSIS — R569 Unspecified convulsions: Secondary | ICD-10-CM | POA: Diagnosis not present

## 2022-02-05 DIAGNOSIS — R262 Difficulty in walking, not elsewhere classified: Secondary | ICD-10-CM | POA: Diagnosis not present

## 2022-02-05 DIAGNOSIS — M6281 Muscle weakness (generalized): Secondary | ICD-10-CM | POA: Diagnosis not present

## 2022-02-05 DIAGNOSIS — R1319 Other dysphagia: Secondary | ICD-10-CM | POA: Diagnosis not present

## 2022-02-26 DIAGNOSIS — N183 Chronic kidney disease, stage 3 unspecified: Secondary | ICD-10-CM | POA: Diagnosis not present

## 2022-03-01 DIAGNOSIS — Z79899 Other long term (current) drug therapy: Secondary | ICD-10-CM | POA: Diagnosis not present

## 2022-03-05 DIAGNOSIS — N390 Urinary tract infection, site not specified: Secondary | ICD-10-CM | POA: Diagnosis not present

## 2022-03-10 DIAGNOSIS — N1832 Chronic kidney disease, stage 3b: Secondary | ICD-10-CM | POA: Diagnosis not present

## 2022-03-10 DIAGNOSIS — E871 Hypo-osmolality and hyponatremia: Secondary | ICD-10-CM | POA: Diagnosis not present

## 2022-03-10 DIAGNOSIS — I129 Hypertensive chronic kidney disease with stage 1 through stage 4 chronic kidney disease, or unspecified chronic kidney disease: Secondary | ICD-10-CM | POA: Diagnosis not present

## 2022-03-10 DIAGNOSIS — E875 Hyperkalemia: Secondary | ICD-10-CM | POA: Diagnosis not present

## 2022-03-11 DIAGNOSIS — N390 Urinary tract infection, site not specified: Secondary | ICD-10-CM | POA: Diagnosis not present

## 2022-03-11 DIAGNOSIS — A499 Bacterial infection, unspecified: Secondary | ICD-10-CM | POA: Diagnosis not present

## 2022-03-11 DIAGNOSIS — R410 Disorientation, unspecified: Secondary | ICD-10-CM | POA: Diagnosis not present

## 2022-03-17 DIAGNOSIS — M170 Bilateral primary osteoarthritis of knee: Secondary | ICD-10-CM | POA: Diagnosis not present

## 2022-03-18 DIAGNOSIS — M170 Bilateral primary osteoarthritis of knee: Secondary | ICD-10-CM | POA: Diagnosis not present

## 2022-03-19 DIAGNOSIS — M170 Bilateral primary osteoarthritis of knee: Secondary | ICD-10-CM | POA: Diagnosis not present

## 2022-03-20 DIAGNOSIS — M170 Bilateral primary osteoarthritis of knee: Secondary | ICD-10-CM | POA: Diagnosis not present

## 2022-03-23 DIAGNOSIS — M170 Bilateral primary osteoarthritis of knee: Secondary | ICD-10-CM | POA: Diagnosis not present

## 2022-03-24 DIAGNOSIS — M170 Bilateral primary osteoarthritis of knee: Secondary | ICD-10-CM | POA: Diagnosis not present

## 2022-03-25 DIAGNOSIS — M170 Bilateral primary osteoarthritis of knee: Secondary | ICD-10-CM | POA: Diagnosis not present

## 2022-03-26 DIAGNOSIS — M170 Bilateral primary osteoarthritis of knee: Secondary | ICD-10-CM | POA: Diagnosis not present

## 2022-03-27 DIAGNOSIS — M170 Bilateral primary osteoarthritis of knee: Secondary | ICD-10-CM | POA: Diagnosis not present

## 2022-03-29 DIAGNOSIS — M170 Bilateral primary osteoarthritis of knee: Secondary | ICD-10-CM | POA: Diagnosis not present

## 2022-03-29 DIAGNOSIS — Z79899 Other long term (current) drug therapy: Secondary | ICD-10-CM | POA: Diagnosis not present

## 2022-03-31 DIAGNOSIS — M170 Bilateral primary osteoarthritis of knee: Secondary | ICD-10-CM | POA: Diagnosis not present

## 2022-04-01 DIAGNOSIS — M170 Bilateral primary osteoarthritis of knee: Secondary | ICD-10-CM | POA: Diagnosis not present

## 2022-04-02 DIAGNOSIS — M170 Bilateral primary osteoarthritis of knee: Secondary | ICD-10-CM | POA: Diagnosis not present

## 2022-04-03 DIAGNOSIS — M170 Bilateral primary osteoarthritis of knee: Secondary | ICD-10-CM | POA: Diagnosis not present

## 2022-04-05 DIAGNOSIS — M170 Bilateral primary osteoarthritis of knee: Secondary | ICD-10-CM | POA: Diagnosis not present

## 2022-04-06 DIAGNOSIS — M170 Bilateral primary osteoarthritis of knee: Secondary | ICD-10-CM | POA: Diagnosis not present

## 2022-04-07 DIAGNOSIS — M170 Bilateral primary osteoarthritis of knee: Secondary | ICD-10-CM | POA: Diagnosis not present

## 2022-04-08 DIAGNOSIS — M170 Bilateral primary osteoarthritis of knee: Secondary | ICD-10-CM | POA: Diagnosis not present

## 2022-04-09 DIAGNOSIS — M170 Bilateral primary osteoarthritis of knee: Secondary | ICD-10-CM | POA: Diagnosis not present

## 2022-04-20 DIAGNOSIS — I10 Essential (primary) hypertension: Secondary | ICD-10-CM | POA: Diagnosis not present

## 2022-04-20 DIAGNOSIS — R262 Difficulty in walking, not elsewhere classified: Secondary | ICD-10-CM | POA: Diagnosis not present

## 2022-04-20 DIAGNOSIS — R531 Weakness: Secondary | ICD-10-CM | POA: Diagnosis not present

## 2022-04-20 DIAGNOSIS — E039 Hypothyroidism, unspecified: Secondary | ICD-10-CM | POA: Diagnosis not present

## 2022-04-20 DIAGNOSIS — N184 Chronic kidney disease, stage 4 (severe): Secondary | ICD-10-CM | POA: Diagnosis not present

## 2022-04-20 DIAGNOSIS — R569 Unspecified convulsions: Secondary | ICD-10-CM | POA: Diagnosis not present

## 2022-04-20 DIAGNOSIS — M6281 Muscle weakness (generalized): Secondary | ICD-10-CM | POA: Diagnosis not present

## 2022-04-20 DIAGNOSIS — E785 Hyperlipidemia, unspecified: Secondary | ICD-10-CM | POA: Diagnosis not present

## 2022-04-20 DIAGNOSIS — Z79899 Other long term (current) drug therapy: Secondary | ICD-10-CM | POA: Diagnosis not present

## 2022-04-20 DIAGNOSIS — I639 Cerebral infarction, unspecified: Secondary | ICD-10-CM | POA: Diagnosis not present

## 2022-04-20 DIAGNOSIS — E559 Vitamin D deficiency, unspecified: Secondary | ICD-10-CM | POA: Diagnosis not present

## 2022-04-20 DIAGNOSIS — M170 Bilateral primary osteoarthritis of knee: Secondary | ICD-10-CM | POA: Diagnosis not present

## 2022-04-21 DIAGNOSIS — Z79899 Other long term (current) drug therapy: Secondary | ICD-10-CM | POA: Diagnosis not present

## 2022-04-21 DIAGNOSIS — M170 Bilateral primary osteoarthritis of knee: Secondary | ICD-10-CM | POA: Diagnosis not present

## 2022-04-21 DIAGNOSIS — R569 Unspecified convulsions: Secondary | ICD-10-CM | POA: Diagnosis not present

## 2022-04-21 DIAGNOSIS — M6281 Muscle weakness (generalized): Secondary | ICD-10-CM | POA: Diagnosis not present

## 2022-04-21 DIAGNOSIS — R531 Weakness: Secondary | ICD-10-CM | POA: Diagnosis not present

## 2022-04-21 DIAGNOSIS — I10 Essential (primary) hypertension: Secondary | ICD-10-CM | POA: Diagnosis not present

## 2022-04-21 DIAGNOSIS — E039 Hypothyroidism, unspecified: Secondary | ICD-10-CM | POA: Diagnosis not present

## 2022-04-21 DIAGNOSIS — N184 Chronic kidney disease, stage 4 (severe): Secondary | ICD-10-CM | POA: Diagnosis not present

## 2022-04-21 DIAGNOSIS — E559 Vitamin D deficiency, unspecified: Secondary | ICD-10-CM | POA: Diagnosis not present

## 2022-04-21 DIAGNOSIS — E785 Hyperlipidemia, unspecified: Secondary | ICD-10-CM | POA: Diagnosis not present

## 2022-04-21 DIAGNOSIS — I639 Cerebral infarction, unspecified: Secondary | ICD-10-CM | POA: Diagnosis not present

## 2022-04-21 DIAGNOSIS — R262 Difficulty in walking, not elsewhere classified: Secondary | ICD-10-CM | POA: Diagnosis not present

## 2022-04-22 DIAGNOSIS — M6281 Muscle weakness (generalized): Secondary | ICD-10-CM | POA: Diagnosis not present

## 2022-04-22 DIAGNOSIS — R262 Difficulty in walking, not elsewhere classified: Secondary | ICD-10-CM | POA: Diagnosis not present

## 2022-04-23 DIAGNOSIS — R262 Difficulty in walking, not elsewhere classified: Secondary | ICD-10-CM | POA: Diagnosis not present

## 2022-04-23 DIAGNOSIS — M6281 Muscle weakness (generalized): Secondary | ICD-10-CM | POA: Diagnosis not present

## 2022-04-26 DIAGNOSIS — R262 Difficulty in walking, not elsewhere classified: Secondary | ICD-10-CM | POA: Diagnosis not present

## 2022-04-26 DIAGNOSIS — M6281 Muscle weakness (generalized): Secondary | ICD-10-CM | POA: Diagnosis not present

## 2022-04-27 DIAGNOSIS — I679 Cerebrovascular disease, unspecified: Secondary | ICD-10-CM | POA: Diagnosis not present

## 2022-04-27 DIAGNOSIS — R5381 Other malaise: Secondary | ICD-10-CM | POA: Diagnosis not present

## 2022-04-27 DIAGNOSIS — R262 Difficulty in walking, not elsewhere classified: Secondary | ICD-10-CM | POA: Diagnosis not present

## 2022-04-27 DIAGNOSIS — M6281 Muscle weakness (generalized): Secondary | ICD-10-CM | POA: Diagnosis not present

## 2022-04-28 DIAGNOSIS — M6281 Muscle weakness (generalized): Secondary | ICD-10-CM | POA: Diagnosis not present

## 2022-04-28 DIAGNOSIS — R262 Difficulty in walking, not elsewhere classified: Secondary | ICD-10-CM | POA: Diagnosis not present

## 2022-04-29 DIAGNOSIS — R262 Difficulty in walking, not elsewhere classified: Secondary | ICD-10-CM | POA: Diagnosis not present

## 2022-04-29 DIAGNOSIS — M6281 Muscle weakness (generalized): Secondary | ICD-10-CM | POA: Diagnosis not present

## 2022-04-30 DIAGNOSIS — R262 Difficulty in walking, not elsewhere classified: Secondary | ICD-10-CM | POA: Diagnosis not present

## 2022-04-30 DIAGNOSIS — M6281 Muscle weakness (generalized): Secondary | ICD-10-CM | POA: Diagnosis not present

## 2022-05-01 DIAGNOSIS — R262 Difficulty in walking, not elsewhere classified: Secondary | ICD-10-CM | POA: Diagnosis not present

## 2022-05-01 DIAGNOSIS — M6281 Muscle weakness (generalized): Secondary | ICD-10-CM | POA: Diagnosis not present

## 2022-05-03 DIAGNOSIS — R262 Difficulty in walking, not elsewhere classified: Secondary | ICD-10-CM | POA: Diagnosis not present

## 2022-05-03 DIAGNOSIS — M6281 Muscle weakness (generalized): Secondary | ICD-10-CM | POA: Diagnosis not present

## 2022-05-04 DIAGNOSIS — M6281 Muscle weakness (generalized): Secondary | ICD-10-CM | POA: Diagnosis not present

## 2022-05-04 DIAGNOSIS — I7091 Generalized atherosclerosis: Secondary | ICD-10-CM | POA: Diagnosis not present

## 2022-05-04 DIAGNOSIS — R262 Difficulty in walking, not elsewhere classified: Secondary | ICD-10-CM | POA: Diagnosis not present

## 2022-05-04 DIAGNOSIS — B351 Tinea unguium: Secondary | ICD-10-CM | POA: Diagnosis not present

## 2022-05-05 DIAGNOSIS — I10 Essential (primary) hypertension: Secondary | ICD-10-CM | POA: Diagnosis not present

## 2022-05-05 DIAGNOSIS — E039 Hypothyroidism, unspecified: Secondary | ICD-10-CM | POA: Diagnosis not present

## 2022-05-05 DIAGNOSIS — R262 Difficulty in walking, not elsewhere classified: Secondary | ICD-10-CM | POA: Diagnosis not present

## 2022-05-05 DIAGNOSIS — M6281 Muscle weakness (generalized): Secondary | ICD-10-CM | POA: Diagnosis not present

## 2022-05-05 DIAGNOSIS — D649 Anemia, unspecified: Secondary | ICD-10-CM | POA: Diagnosis not present

## 2022-05-06 DIAGNOSIS — M6281 Muscle weakness (generalized): Secondary | ICD-10-CM | POA: Diagnosis not present

## 2022-05-06 DIAGNOSIS — R262 Difficulty in walking, not elsewhere classified: Secondary | ICD-10-CM | POA: Diagnosis not present

## 2022-05-07 DIAGNOSIS — R262 Difficulty in walking, not elsewhere classified: Secondary | ICD-10-CM | POA: Diagnosis not present

## 2022-05-07 DIAGNOSIS — M6281 Muscle weakness (generalized): Secondary | ICD-10-CM | POA: Diagnosis not present

## 2022-05-10 DIAGNOSIS — M6281 Muscle weakness (generalized): Secondary | ICD-10-CM | POA: Diagnosis not present

## 2022-05-10 DIAGNOSIS — R262 Difficulty in walking, not elsewhere classified: Secondary | ICD-10-CM | POA: Diagnosis not present

## 2022-05-11 DIAGNOSIS — R262 Difficulty in walking, not elsewhere classified: Secondary | ICD-10-CM | POA: Diagnosis not present

## 2022-05-11 DIAGNOSIS — M6281 Muscle weakness (generalized): Secondary | ICD-10-CM | POA: Diagnosis not present

## 2022-05-12 DIAGNOSIS — R262 Difficulty in walking, not elsewhere classified: Secondary | ICD-10-CM | POA: Diagnosis not present

## 2022-05-12 DIAGNOSIS — M6281 Muscle weakness (generalized): Secondary | ICD-10-CM | POA: Diagnosis not present

## 2022-05-13 DIAGNOSIS — M6281 Muscle weakness (generalized): Secondary | ICD-10-CM | POA: Diagnosis not present

## 2022-05-13 DIAGNOSIS — R262 Difficulty in walking, not elsewhere classified: Secondary | ICD-10-CM | POA: Diagnosis not present

## 2022-05-14 DIAGNOSIS — R262 Difficulty in walking, not elsewhere classified: Secondary | ICD-10-CM | POA: Diagnosis not present

## 2022-05-14 DIAGNOSIS — M6281 Muscle weakness (generalized): Secondary | ICD-10-CM | POA: Diagnosis not present

## 2022-05-17 DIAGNOSIS — R262 Difficulty in walking, not elsewhere classified: Secondary | ICD-10-CM | POA: Diagnosis not present

## 2022-05-17 DIAGNOSIS — M6281 Muscle weakness (generalized): Secondary | ICD-10-CM | POA: Diagnosis not present

## 2022-05-18 DIAGNOSIS — R262 Difficulty in walking, not elsewhere classified: Secondary | ICD-10-CM | POA: Diagnosis not present

## 2022-05-18 DIAGNOSIS — M6281 Muscle weakness (generalized): Secondary | ICD-10-CM | POA: Diagnosis not present

## 2022-05-19 DIAGNOSIS — R262 Difficulty in walking, not elsewhere classified: Secondary | ICD-10-CM | POA: Diagnosis not present

## 2022-05-19 DIAGNOSIS — M6281 Muscle weakness (generalized): Secondary | ICD-10-CM | POA: Diagnosis not present

## 2022-05-20 DIAGNOSIS — M6281 Muscle weakness (generalized): Secondary | ICD-10-CM | POA: Diagnosis not present

## 2022-05-20 DIAGNOSIS — R262 Difficulty in walking, not elsewhere classified: Secondary | ICD-10-CM | POA: Diagnosis not present

## 2022-05-21 DIAGNOSIS — R41841 Cognitive communication deficit: Secondary | ICD-10-CM | POA: Diagnosis not present

## 2022-05-21 DIAGNOSIS — M6281 Muscle weakness (generalized): Secondary | ICD-10-CM | POA: Diagnosis not present

## 2022-05-21 DIAGNOSIS — R262 Difficulty in walking, not elsewhere classified: Secondary | ICD-10-CM | POA: Diagnosis not present

## 2022-05-24 DIAGNOSIS — R41841 Cognitive communication deficit: Secondary | ICD-10-CM | POA: Diagnosis not present

## 2022-05-24 DIAGNOSIS — M6281 Muscle weakness (generalized): Secondary | ICD-10-CM | POA: Diagnosis not present

## 2022-05-24 DIAGNOSIS — R262 Difficulty in walking, not elsewhere classified: Secondary | ICD-10-CM | POA: Diagnosis not present

## 2022-05-25 DIAGNOSIS — R41841 Cognitive communication deficit: Secondary | ICD-10-CM | POA: Diagnosis not present

## 2022-05-25 DIAGNOSIS — R262 Difficulty in walking, not elsewhere classified: Secondary | ICD-10-CM | POA: Diagnosis not present

## 2022-05-25 DIAGNOSIS — M6281 Muscle weakness (generalized): Secondary | ICD-10-CM | POA: Diagnosis not present

## 2022-05-26 DIAGNOSIS — M6281 Muscle weakness (generalized): Secondary | ICD-10-CM | POA: Diagnosis not present

## 2022-05-26 DIAGNOSIS — R262 Difficulty in walking, not elsewhere classified: Secondary | ICD-10-CM | POA: Diagnosis not present

## 2022-05-26 DIAGNOSIS — R41841 Cognitive communication deficit: Secondary | ICD-10-CM | POA: Diagnosis not present

## 2022-05-27 DIAGNOSIS — R41841 Cognitive communication deficit: Secondary | ICD-10-CM | POA: Diagnosis not present

## 2022-05-27 DIAGNOSIS — R262 Difficulty in walking, not elsewhere classified: Secondary | ICD-10-CM | POA: Diagnosis not present

## 2022-05-27 DIAGNOSIS — M6281 Muscle weakness (generalized): Secondary | ICD-10-CM | POA: Diagnosis not present

## 2022-05-28 DIAGNOSIS — R262 Difficulty in walking, not elsewhere classified: Secondary | ICD-10-CM | POA: Diagnosis not present

## 2022-05-28 DIAGNOSIS — R41841 Cognitive communication deficit: Secondary | ICD-10-CM | POA: Diagnosis not present

## 2022-05-28 DIAGNOSIS — M6281 Muscle weakness (generalized): Secondary | ICD-10-CM | POA: Diagnosis not present

## 2022-05-29 DIAGNOSIS — M6281 Muscle weakness (generalized): Secondary | ICD-10-CM | POA: Diagnosis not present

## 2022-05-29 DIAGNOSIS — R262 Difficulty in walking, not elsewhere classified: Secondary | ICD-10-CM | POA: Diagnosis not present

## 2022-05-29 DIAGNOSIS — R41841 Cognitive communication deficit: Secondary | ICD-10-CM | POA: Diagnosis not present

## 2022-05-31 DIAGNOSIS — I10 Essential (primary) hypertension: Secondary | ICD-10-CM | POA: Diagnosis not present

## 2022-05-31 DIAGNOSIS — E785 Hyperlipidemia, unspecified: Secondary | ICD-10-CM | POA: Diagnosis not present

## 2022-05-31 DIAGNOSIS — E039 Hypothyroidism, unspecified: Secondary | ICD-10-CM | POA: Diagnosis not present

## 2022-05-31 DIAGNOSIS — R262 Difficulty in walking, not elsewhere classified: Secondary | ICD-10-CM | POA: Diagnosis not present

## 2022-05-31 DIAGNOSIS — R41841 Cognitive communication deficit: Secondary | ICD-10-CM | POA: Diagnosis not present

## 2022-05-31 DIAGNOSIS — M6281 Muscle weakness (generalized): Secondary | ICD-10-CM | POA: Diagnosis not present

## 2022-06-01 DIAGNOSIS — M6281 Muscle weakness (generalized): Secondary | ICD-10-CM | POA: Diagnosis not present

## 2022-06-01 DIAGNOSIS — R41841 Cognitive communication deficit: Secondary | ICD-10-CM | POA: Diagnosis not present

## 2022-06-01 DIAGNOSIS — F411 Generalized anxiety disorder: Secondary | ICD-10-CM | POA: Diagnosis not present

## 2022-06-01 DIAGNOSIS — R262 Difficulty in walking, not elsewhere classified: Secondary | ICD-10-CM | POA: Diagnosis not present

## 2022-06-01 DIAGNOSIS — F01B11 Vascular dementia, moderate, with agitation: Secondary | ICD-10-CM | POA: Diagnosis not present

## 2022-06-01 DIAGNOSIS — F331 Major depressive disorder, recurrent, moderate: Secondary | ICD-10-CM | POA: Diagnosis not present

## 2022-06-02 DIAGNOSIS — M6281 Muscle weakness (generalized): Secondary | ICD-10-CM | POA: Diagnosis not present

## 2022-06-02 DIAGNOSIS — R41841 Cognitive communication deficit: Secondary | ICD-10-CM | POA: Diagnosis not present

## 2022-06-02 DIAGNOSIS — R262 Difficulty in walking, not elsewhere classified: Secondary | ICD-10-CM | POA: Diagnosis not present

## 2022-06-03 DIAGNOSIS — R262 Difficulty in walking, not elsewhere classified: Secondary | ICD-10-CM | POA: Diagnosis not present

## 2022-06-03 DIAGNOSIS — M6281 Muscle weakness (generalized): Secondary | ICD-10-CM | POA: Diagnosis not present

## 2022-06-03 DIAGNOSIS — R41841 Cognitive communication deficit: Secondary | ICD-10-CM | POA: Diagnosis not present

## 2022-06-04 DIAGNOSIS — F03911 Unspecified dementia, unspecified severity, with agitation: Secondary | ICD-10-CM | POA: Diagnosis not present

## 2022-06-04 DIAGNOSIS — M6281 Muscle weakness (generalized): Secondary | ICD-10-CM | POA: Diagnosis not present

## 2022-06-04 DIAGNOSIS — R262 Difficulty in walking, not elsewhere classified: Secondary | ICD-10-CM | POA: Diagnosis not present

## 2022-06-04 DIAGNOSIS — R41841 Cognitive communication deficit: Secondary | ICD-10-CM | POA: Diagnosis not present

## 2022-06-04 DIAGNOSIS — F419 Anxiety disorder, unspecified: Secondary | ICD-10-CM | POA: Diagnosis not present

## 2022-06-07 DIAGNOSIS — R262 Difficulty in walking, not elsewhere classified: Secondary | ICD-10-CM | POA: Diagnosis not present

## 2022-06-07 DIAGNOSIS — M6281 Muscle weakness (generalized): Secondary | ICD-10-CM | POA: Diagnosis not present

## 2022-06-07 DIAGNOSIS — R41841 Cognitive communication deficit: Secondary | ICD-10-CM | POA: Diagnosis not present

## 2022-06-07 DIAGNOSIS — I10 Essential (primary) hypertension: Secondary | ICD-10-CM | POA: Diagnosis not present

## 2022-06-08 DIAGNOSIS — I10 Essential (primary) hypertension: Secondary | ICD-10-CM | POA: Diagnosis not present

## 2022-06-08 DIAGNOSIS — R262 Difficulty in walking, not elsewhere classified: Secondary | ICD-10-CM | POA: Diagnosis not present

## 2022-06-08 DIAGNOSIS — R41841 Cognitive communication deficit: Secondary | ICD-10-CM | POA: Diagnosis not present

## 2022-06-08 DIAGNOSIS — M6281 Muscle weakness (generalized): Secondary | ICD-10-CM | POA: Diagnosis not present

## 2022-06-08 DIAGNOSIS — D649 Anemia, unspecified: Secondary | ICD-10-CM | POA: Diagnosis not present

## 2022-06-09 DIAGNOSIS — R41841 Cognitive communication deficit: Secondary | ICD-10-CM | POA: Diagnosis not present

## 2022-06-09 DIAGNOSIS — M6281 Muscle weakness (generalized): Secondary | ICD-10-CM | POA: Diagnosis not present

## 2022-06-09 DIAGNOSIS — F419 Anxiety disorder, unspecified: Secondary | ICD-10-CM | POA: Diagnosis not present

## 2022-06-09 DIAGNOSIS — F03911 Unspecified dementia, unspecified severity, with agitation: Secondary | ICD-10-CM | POA: Diagnosis not present

## 2022-06-09 DIAGNOSIS — F329 Major depressive disorder, single episode, unspecified: Secondary | ICD-10-CM | POA: Diagnosis not present

## 2022-06-09 DIAGNOSIS — R262 Difficulty in walking, not elsewhere classified: Secondary | ICD-10-CM | POA: Diagnosis not present

## 2022-06-16 DIAGNOSIS — R809 Proteinuria, unspecified: Secondary | ICD-10-CM | POA: Diagnosis not present

## 2022-06-16 DIAGNOSIS — E875 Hyperkalemia: Secondary | ICD-10-CM | POA: Diagnosis not present

## 2022-06-16 DIAGNOSIS — R569 Unspecified convulsions: Secondary | ICD-10-CM | POA: Diagnosis not present

## 2022-06-16 DIAGNOSIS — N1832 Chronic kidney disease, stage 3b: Secondary | ICD-10-CM | POA: Diagnosis not present

## 2022-06-16 DIAGNOSIS — E871 Hypo-osmolality and hyponatremia: Secondary | ICD-10-CM | POA: Diagnosis not present

## 2022-06-16 DIAGNOSIS — R1310 Dysphagia, unspecified: Secondary | ICD-10-CM | POA: Diagnosis not present

## 2022-06-16 DIAGNOSIS — I639 Cerebral infarction, unspecified: Secondary | ICD-10-CM | POA: Diagnosis not present

## 2022-06-16 DIAGNOSIS — F01B11 Vascular dementia, moderate, with agitation: Secondary | ICD-10-CM | POA: Diagnosis not present

## 2022-06-16 DIAGNOSIS — I129 Hypertensive chronic kidney disease with stage 1 through stage 4 chronic kidney disease, or unspecified chronic kidney disease: Secondary | ICD-10-CM | POA: Diagnosis not present

## 2022-06-18 DIAGNOSIS — I10 Essential (primary) hypertension: Secondary | ICD-10-CM | POA: Diagnosis not present

## 2022-06-18 DIAGNOSIS — E039 Hypothyroidism, unspecified: Secondary | ICD-10-CM | POA: Diagnosis not present

## 2022-06-18 DIAGNOSIS — F01B11 Vascular dementia, moderate, with agitation: Secondary | ICD-10-CM | POA: Diagnosis not present

## 2022-06-18 DIAGNOSIS — F03911 Unspecified dementia, unspecified severity, with agitation: Secondary | ICD-10-CM | POA: Diagnosis not present

## 2022-06-18 DIAGNOSIS — N184 Chronic kidney disease, stage 4 (severe): Secondary | ICD-10-CM | POA: Diagnosis not present

## 2022-06-22 DIAGNOSIS — F411 Generalized anxiety disorder: Secondary | ICD-10-CM | POA: Diagnosis not present

## 2022-06-22 DIAGNOSIS — F331 Major depressive disorder, recurrent, moderate: Secondary | ICD-10-CM | POA: Diagnosis not present

## 2022-06-22 DIAGNOSIS — F01B11 Vascular dementia, moderate, with agitation: Secondary | ICD-10-CM | POA: Diagnosis not present

## 2022-06-27 IMAGING — US US RENAL
1 series · 14 of 25 positions shown · non-contrast
Comparison: None.

CLINICAL DATA: Acute renal failure

EXAM:
RENAL / URINARY TRACT ULTRASOUND COMPLETE

[Series 1: us renal · 14 of 41 slices shown]
[im 1/41]
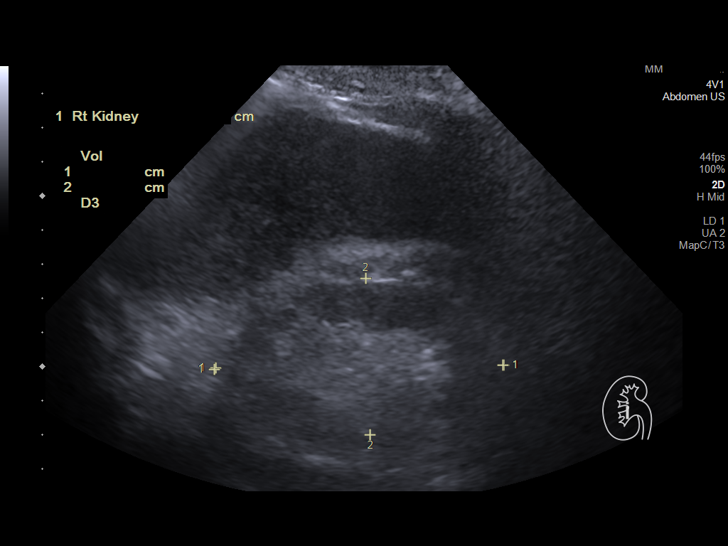
[im 4/41]
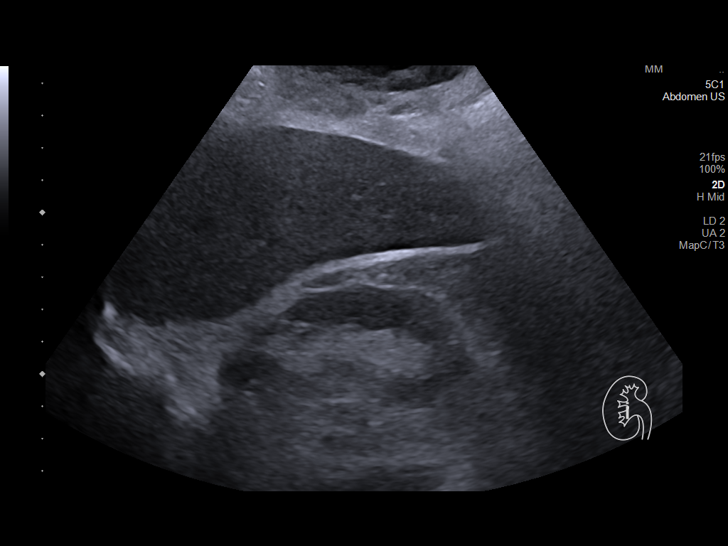
[im 7/41]
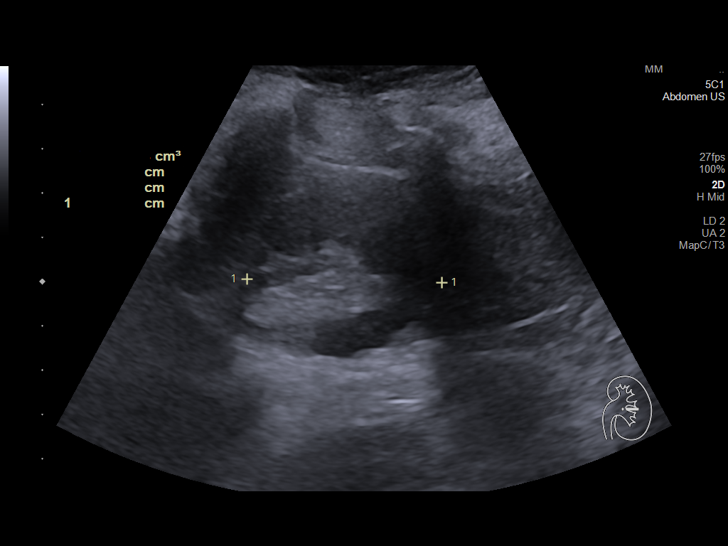
[im 11/41]
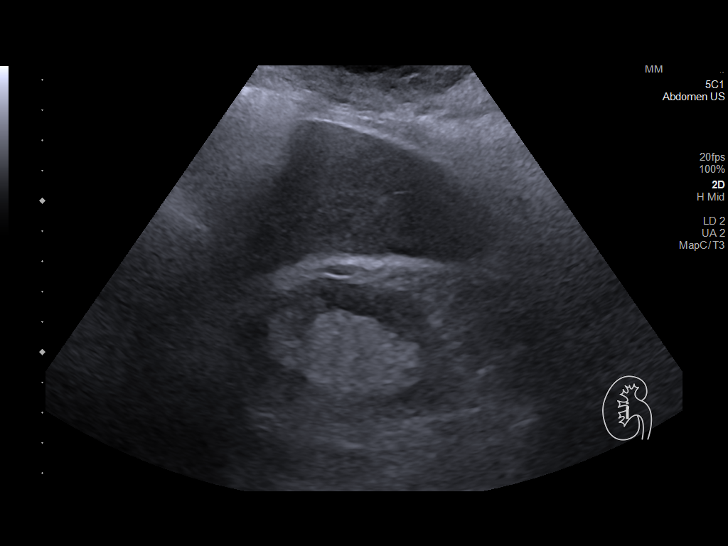
[im 14/41]
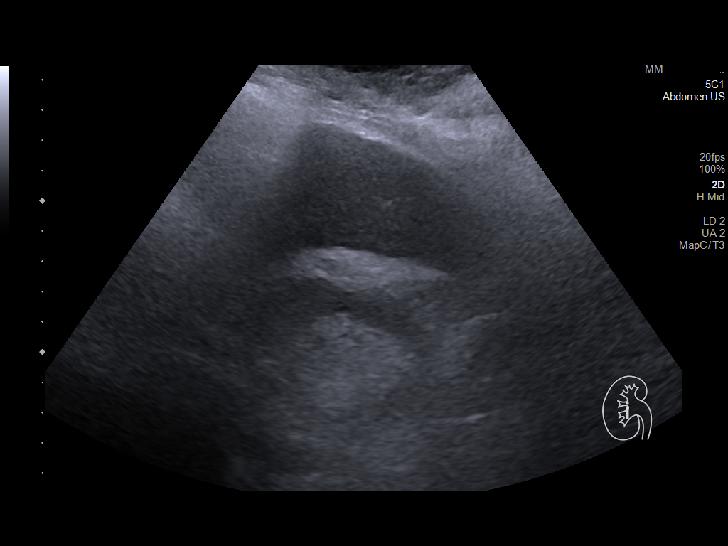
[im 16/41]
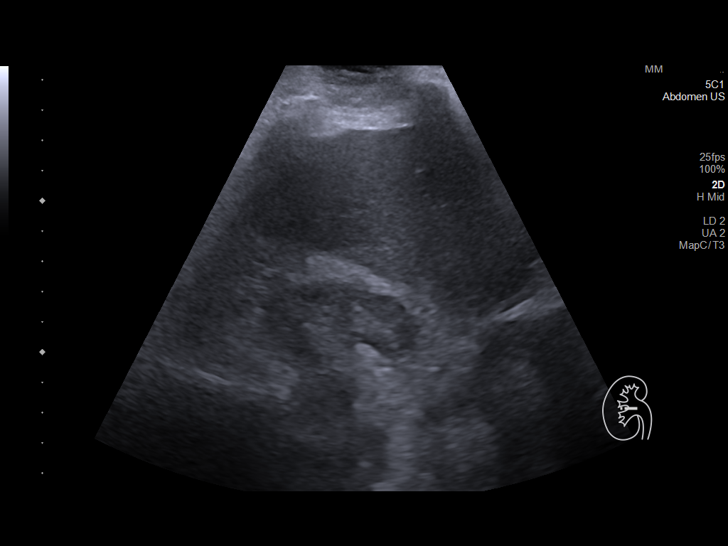
[im 19/41]
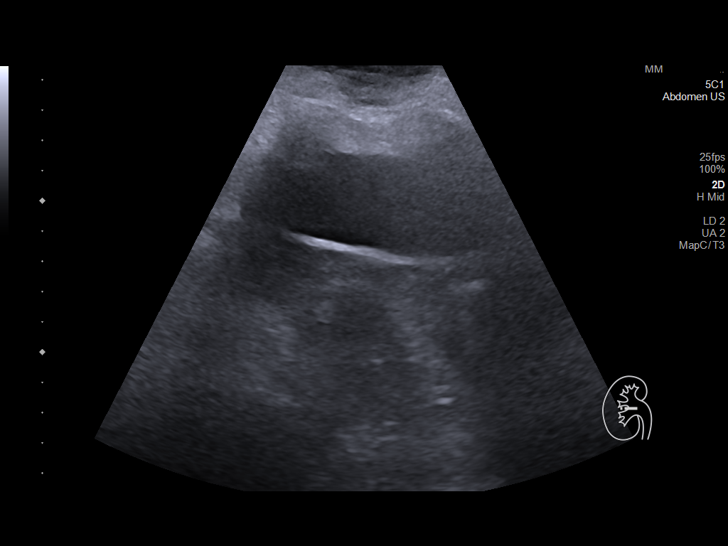
[im 22/41]
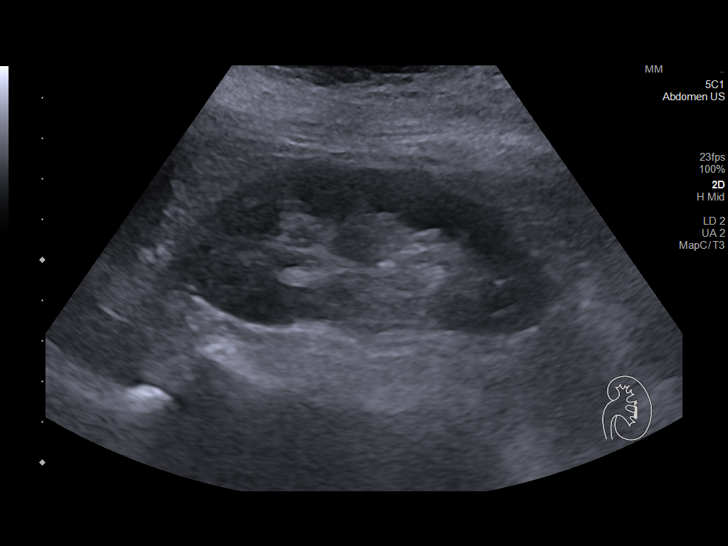
[im 26/41]
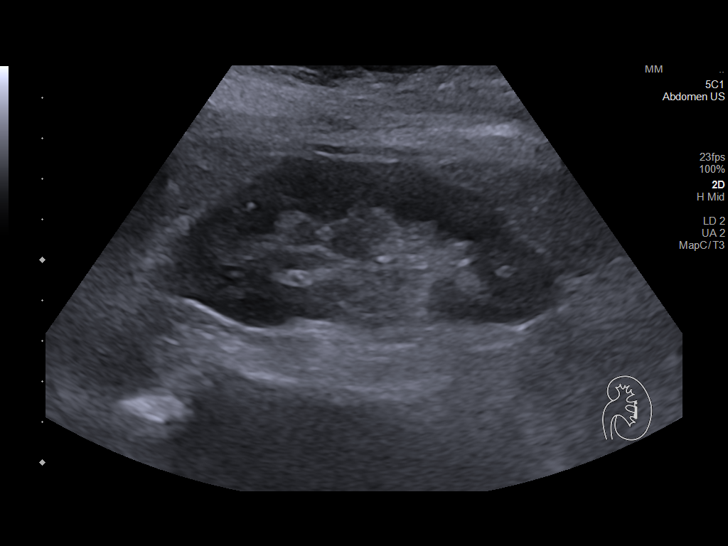
[im 27/41]
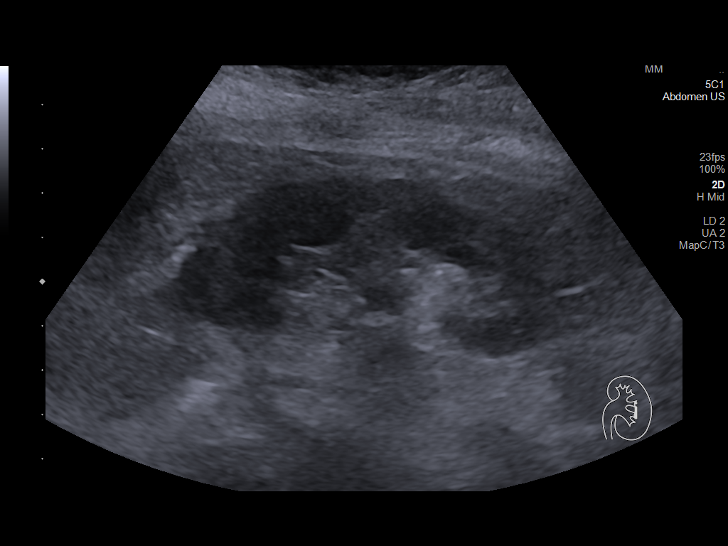
[im 31/41]
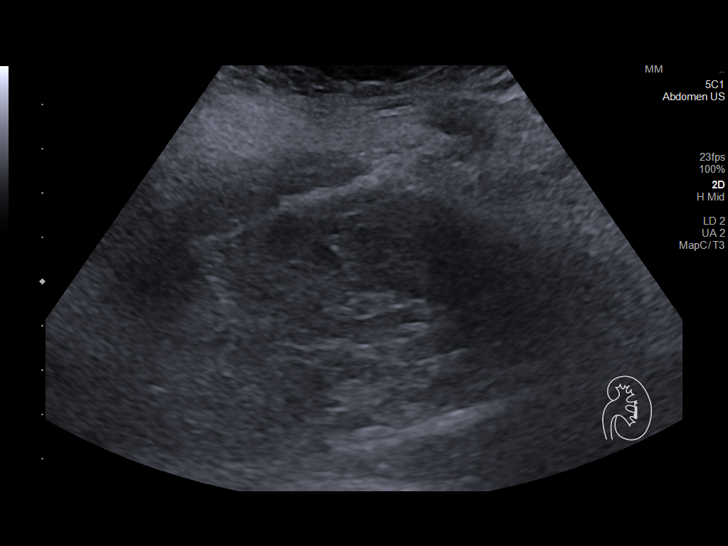
[im 34/41]
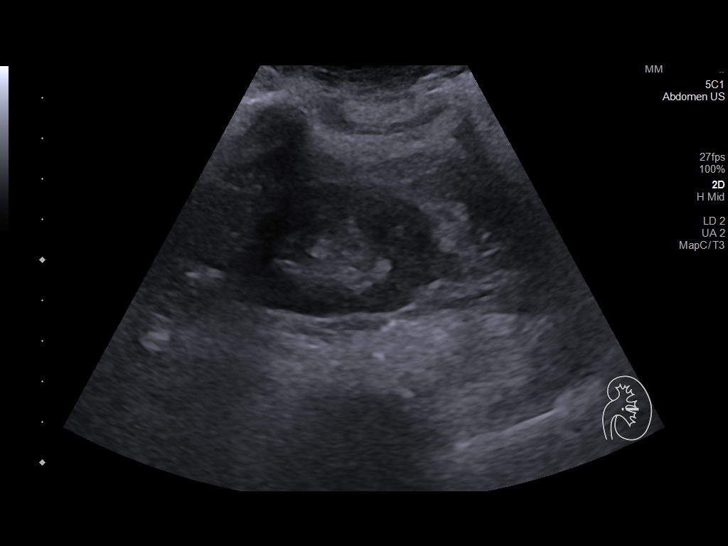
[im 37/41]
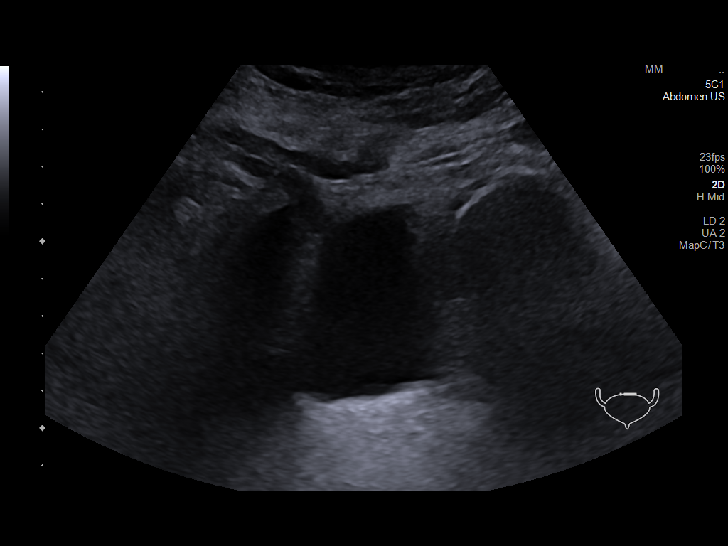
[im 41/41]
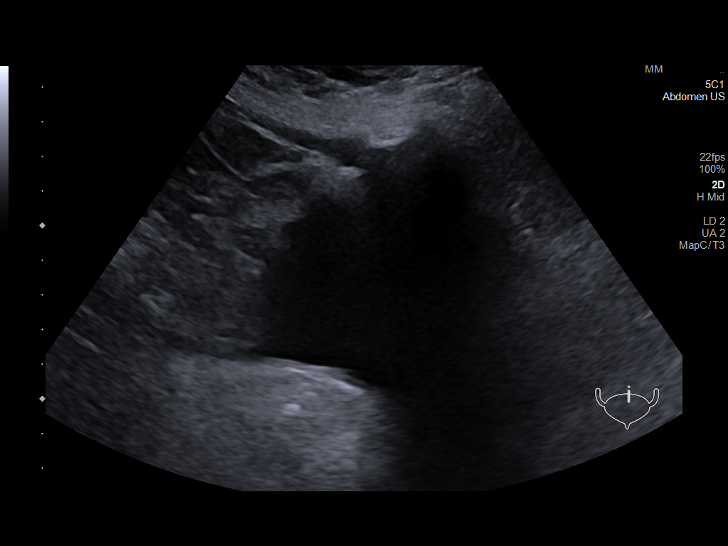

[14 of 25 positions shown; findings below may reference images not displayed]

FINDINGS: Right Kidney:

Renal measurements: 8.5 x 4.6 x 4.5 cm = volume: 91 mL. Echogenicity
within normal limits. No mass or hydronephrosis visualized.

Left Kidney:

Renal measurements: 9.6 x 4.1 x 4.4 cm = volume: 90 mL. Echogenicity
within normal limits. No mass or hydronephrosis visualized.

Bladder:

Appears normal for degree of bladder distention.

Other:

None.
IMPRESSION: Negative renal ultrasound

## 2022-07-07 DIAGNOSIS — Z23 Encounter for immunization: Secondary | ICD-10-CM | POA: Diagnosis not present

## 2022-07-09 DIAGNOSIS — H40011 Open angle with borderline findings, low risk, right eye: Secondary | ICD-10-CM | POA: Diagnosis not present

## 2022-07-09 DIAGNOSIS — H524 Presbyopia: Secondary | ICD-10-CM | POA: Diagnosis not present

## 2022-07-09 DIAGNOSIS — Z961 Presence of intraocular lens: Secondary | ICD-10-CM | POA: Diagnosis not present

## 2022-07-12 DIAGNOSIS — I10 Essential (primary) hypertension: Secondary | ICD-10-CM | POA: Diagnosis not present

## 2022-07-12 DIAGNOSIS — F419 Anxiety disorder, unspecified: Secondary | ICD-10-CM | POA: Diagnosis not present

## 2022-07-12 DIAGNOSIS — N1832 Chronic kidney disease, stage 3b: Secondary | ICD-10-CM | POA: Diagnosis not present

## 2022-07-12 DIAGNOSIS — I639 Cerebral infarction, unspecified: Secondary | ICD-10-CM | POA: Diagnosis not present

## 2022-07-12 DIAGNOSIS — E039 Hypothyroidism, unspecified: Secondary | ICD-10-CM | POA: Diagnosis not present

## 2022-07-12 DIAGNOSIS — F01B11 Vascular dementia, moderate, with agitation: Secondary | ICD-10-CM | POA: Diagnosis not present

## 2022-07-13 DIAGNOSIS — F0390 Unspecified dementia without behavioral disturbance: Secondary | ICD-10-CM | POA: Diagnosis not present

## 2022-07-13 DIAGNOSIS — R569 Unspecified convulsions: Secondary | ICD-10-CM | POA: Diagnosis not present

## 2022-07-13 DIAGNOSIS — I679 Cerebrovascular disease, unspecified: Secondary | ICD-10-CM | POA: Diagnosis not present

## 2022-07-20 DIAGNOSIS — F01B11 Vascular dementia, moderate, with agitation: Secondary | ICD-10-CM | POA: Diagnosis not present

## 2022-07-20 DIAGNOSIS — F411 Generalized anxiety disorder: Secondary | ICD-10-CM | POA: Diagnosis not present

## 2022-07-20 DIAGNOSIS — F331 Major depressive disorder, recurrent, moderate: Secondary | ICD-10-CM | POA: Diagnosis not present

## 2022-08-07 DIAGNOSIS — I7091 Generalized atherosclerosis: Secondary | ICD-10-CM | POA: Diagnosis not present

## 2022-08-07 DIAGNOSIS — B351 Tinea unguium: Secondary | ICD-10-CM | POA: Diagnosis not present

## 2022-08-11 DIAGNOSIS — E039 Hypothyroidism, unspecified: Secondary | ICD-10-CM | POA: Diagnosis not present

## 2022-08-11 DIAGNOSIS — I639 Cerebral infarction, unspecified: Secondary | ICD-10-CM | POA: Diagnosis not present

## 2022-08-11 DIAGNOSIS — R269 Unspecified abnormalities of gait and mobility: Secondary | ICD-10-CM | POA: Diagnosis not present

## 2022-08-11 DIAGNOSIS — F01B11 Vascular dementia, moderate, with agitation: Secondary | ICD-10-CM | POA: Diagnosis not present

## 2022-08-11 DIAGNOSIS — I10 Essential (primary) hypertension: Secondary | ICD-10-CM | POA: Diagnosis not present

## 2022-08-11 DIAGNOSIS — E785 Hyperlipidemia, unspecified: Secondary | ICD-10-CM | POA: Diagnosis not present

## 2022-08-11 DIAGNOSIS — N184 Chronic kidney disease, stage 4 (severe): Secondary | ICD-10-CM | POA: Diagnosis not present

## 2022-08-24 DIAGNOSIS — R569 Unspecified convulsions: Secondary | ICD-10-CM | POA: Diagnosis not present

## 2022-08-24 DIAGNOSIS — I739 Peripheral vascular disease, unspecified: Secondary | ICD-10-CM | POA: Diagnosis not present

## 2022-08-24 DIAGNOSIS — F419 Anxiety disorder, unspecified: Secondary | ICD-10-CM | POA: Diagnosis not present

## 2022-08-24 DIAGNOSIS — F01B11 Vascular dementia, moderate, with agitation: Secondary | ICD-10-CM | POA: Diagnosis not present

## 2022-08-24 DIAGNOSIS — N183 Chronic kidney disease, stage 3 unspecified: Secondary | ICD-10-CM | POA: Diagnosis not present

## 2022-08-24 DIAGNOSIS — F331 Major depressive disorder, recurrent, moderate: Secondary | ICD-10-CM | POA: Diagnosis not present

## 2022-09-06 DIAGNOSIS — N184 Chronic kidney disease, stage 4 (severe): Secondary | ICD-10-CM | POA: Diagnosis not present

## 2022-09-06 DIAGNOSIS — I10 Essential (primary) hypertension: Secondary | ICD-10-CM | POA: Diagnosis not present

## 2022-09-06 DIAGNOSIS — I739 Peripheral vascular disease, unspecified: Secondary | ICD-10-CM | POA: Diagnosis not present

## 2022-09-06 DIAGNOSIS — E785 Hyperlipidemia, unspecified: Secondary | ICD-10-CM | POA: Diagnosis not present

## 2022-09-06 DIAGNOSIS — E039 Hypothyroidism, unspecified: Secondary | ICD-10-CM | POA: Diagnosis not present

## 2022-09-06 DIAGNOSIS — F01B11 Vascular dementia, moderate, with agitation: Secondary | ICD-10-CM | POA: Diagnosis not present

## 2022-09-08 DIAGNOSIS — F331 Major depressive disorder, recurrent, moderate: Secondary | ICD-10-CM | POA: Diagnosis not present

## 2022-09-08 DIAGNOSIS — F01511 Vascular dementia, unspecified severity, with agitation: Secondary | ICD-10-CM | POA: Diagnosis not present

## 2022-09-10 DIAGNOSIS — F01B11 Vascular dementia, moderate, with agitation: Secondary | ICD-10-CM | POA: Diagnosis not present

## 2022-09-10 DIAGNOSIS — E039 Hypothyroidism, unspecified: Secondary | ICD-10-CM | POA: Diagnosis not present

## 2022-09-10 DIAGNOSIS — I10 Essential (primary) hypertension: Secondary | ICD-10-CM | POA: Diagnosis not present

## 2022-09-10 DIAGNOSIS — E785 Hyperlipidemia, unspecified: Secondary | ICD-10-CM | POA: Diagnosis not present

## 2022-09-29 DIAGNOSIS — E209 Hypoparathyroidism, unspecified: Secondary | ICD-10-CM | POA: Diagnosis not present

## 2022-09-29 DIAGNOSIS — D649 Anemia, unspecified: Secondary | ICD-10-CM | POA: Diagnosis not present

## 2022-09-29 DIAGNOSIS — E559 Vitamin D deficiency, unspecified: Secondary | ICD-10-CM | POA: Diagnosis not present

## 2022-09-29 DIAGNOSIS — E039 Hypothyroidism, unspecified: Secondary | ICD-10-CM | POA: Diagnosis not present

## 2022-09-30 DIAGNOSIS — R1311 Dysphagia, oral phase: Secondary | ICD-10-CM | POA: Diagnosis not present

## 2022-09-30 DIAGNOSIS — F0390 Unspecified dementia without behavioral disturbance: Secondary | ICD-10-CM | POA: Diagnosis not present

## 2022-10-01 DIAGNOSIS — F0390 Unspecified dementia without behavioral disturbance: Secondary | ICD-10-CM | POA: Diagnosis not present

## 2022-10-01 DIAGNOSIS — R1311 Dysphagia, oral phase: Secondary | ICD-10-CM | POA: Diagnosis not present

## 2022-10-04 DIAGNOSIS — R1311 Dysphagia, oral phase: Secondary | ICD-10-CM | POA: Diagnosis not present

## 2022-10-04 DIAGNOSIS — F0390 Unspecified dementia without behavioral disturbance: Secondary | ICD-10-CM | POA: Diagnosis not present

## 2022-10-05 DIAGNOSIS — F0390 Unspecified dementia without behavioral disturbance: Secondary | ICD-10-CM | POA: Diagnosis not present

## 2022-10-05 DIAGNOSIS — R1311 Dysphagia, oral phase: Secondary | ICD-10-CM | POA: Diagnosis not present

## 2022-10-06 DIAGNOSIS — E039 Hypothyroidism, unspecified: Secondary | ICD-10-CM | POA: Diagnosis not present

## 2022-10-06 DIAGNOSIS — E785 Hyperlipidemia, unspecified: Secondary | ICD-10-CM | POA: Diagnosis not present

## 2022-10-06 DIAGNOSIS — I10 Essential (primary) hypertension: Secondary | ICD-10-CM | POA: Diagnosis not present

## 2022-10-06 DIAGNOSIS — F0390 Unspecified dementia without behavioral disturbance: Secondary | ICD-10-CM | POA: Diagnosis not present

## 2022-10-06 DIAGNOSIS — F01B11 Vascular dementia, moderate, with agitation: Secondary | ICD-10-CM | POA: Diagnosis not present

## 2022-10-06 DIAGNOSIS — R1311 Dysphagia, oral phase: Secondary | ICD-10-CM | POA: Diagnosis not present

## 2022-10-11 DIAGNOSIS — F329 Major depressive disorder, single episode, unspecified: Secondary | ICD-10-CM | POA: Diagnosis not present

## 2022-10-11 DIAGNOSIS — F01B11 Vascular dementia, moderate, with agitation: Secondary | ICD-10-CM | POA: Diagnosis not present

## 2022-10-11 DIAGNOSIS — I10 Essential (primary) hypertension: Secondary | ICD-10-CM | POA: Diagnosis not present

## 2022-10-11 DIAGNOSIS — E039 Hypothyroidism, unspecified: Secondary | ICD-10-CM | POA: Diagnosis not present

## 2022-10-11 DIAGNOSIS — E785 Hyperlipidemia, unspecified: Secondary | ICD-10-CM | POA: Diagnosis not present

## 2022-10-12 DIAGNOSIS — H40051 Ocular hypertension, right eye: Secondary | ICD-10-CM | POA: Diagnosis not present

## 2022-10-12 DIAGNOSIS — Z961 Presence of intraocular lens: Secondary | ICD-10-CM | POA: Diagnosis not present

## 2022-10-18 DIAGNOSIS — F01B11 Vascular dementia, moderate, with agitation: Secondary | ICD-10-CM | POA: Diagnosis not present

## 2022-10-18 DIAGNOSIS — D631 Anemia in chronic kidney disease: Secondary | ICD-10-CM | POA: Diagnosis not present

## 2022-10-18 DIAGNOSIS — I10 Essential (primary) hypertension: Secondary | ICD-10-CM | POA: Diagnosis not present

## 2022-10-23 DIAGNOSIS — B351 Tinea unguium: Secondary | ICD-10-CM | POA: Diagnosis not present

## 2022-10-23 DIAGNOSIS — I7091 Generalized atherosclerosis: Secondary | ICD-10-CM | POA: Diagnosis not present

## 2022-10-26 DIAGNOSIS — R279 Unspecified lack of coordination: Secondary | ICD-10-CM | POA: Diagnosis not present

## 2022-10-26 DIAGNOSIS — M6281 Muscle weakness (generalized): Secondary | ICD-10-CM | POA: Diagnosis not present

## 2022-10-27 DIAGNOSIS — R279 Unspecified lack of coordination: Secondary | ICD-10-CM | POA: Diagnosis not present

## 2022-10-27 DIAGNOSIS — M6281 Muscle weakness (generalized): Secondary | ICD-10-CM | POA: Diagnosis not present

## 2022-10-28 DIAGNOSIS — R279 Unspecified lack of coordination: Secondary | ICD-10-CM | POA: Diagnosis not present

## 2022-10-28 DIAGNOSIS — R809 Proteinuria, unspecified: Secondary | ICD-10-CM | POA: Diagnosis not present

## 2022-10-28 DIAGNOSIS — E211 Secondary hyperparathyroidism, not elsewhere classified: Secondary | ICD-10-CM | POA: Diagnosis not present

## 2022-10-28 DIAGNOSIS — M6281 Muscle weakness (generalized): Secondary | ICD-10-CM | POA: Diagnosis not present

## 2022-10-28 DIAGNOSIS — I129 Hypertensive chronic kidney disease with stage 1 through stage 4 chronic kidney disease, or unspecified chronic kidney disease: Secondary | ICD-10-CM | POA: Diagnosis not present

## 2022-10-28 DIAGNOSIS — N1832 Chronic kidney disease, stage 3b: Secondary | ICD-10-CM | POA: Diagnosis not present

## 2022-10-28 DIAGNOSIS — E871 Hypo-osmolality and hyponatremia: Secondary | ICD-10-CM | POA: Diagnosis not present

## 2022-10-30 DIAGNOSIS — R279 Unspecified lack of coordination: Secondary | ICD-10-CM | POA: Diagnosis not present

## 2022-10-30 DIAGNOSIS — M6281 Muscle weakness (generalized): Secondary | ICD-10-CM | POA: Diagnosis not present

## 2022-11-01 DIAGNOSIS — M6281 Muscle weakness (generalized): Secondary | ICD-10-CM | POA: Diagnosis not present

## 2022-11-01 DIAGNOSIS — R279 Unspecified lack of coordination: Secondary | ICD-10-CM | POA: Diagnosis not present

## 2022-11-02 DIAGNOSIS — R279 Unspecified lack of coordination: Secondary | ICD-10-CM | POA: Diagnosis not present

## 2022-11-02 DIAGNOSIS — M6281 Muscle weakness (generalized): Secondary | ICD-10-CM | POA: Diagnosis not present

## 2022-11-03 DIAGNOSIS — F331 Major depressive disorder, recurrent, moderate: Secondary | ICD-10-CM | POA: Diagnosis not present

## 2022-11-03 DIAGNOSIS — F0390 Unspecified dementia without behavioral disturbance: Secondary | ICD-10-CM | POA: Diagnosis not present

## 2022-11-03 DIAGNOSIS — F411 Generalized anxiety disorder: Secondary | ICD-10-CM | POA: Diagnosis not present

## 2022-11-03 DIAGNOSIS — M6281 Muscle weakness (generalized): Secondary | ICD-10-CM | POA: Diagnosis not present

## 2022-11-03 DIAGNOSIS — R279 Unspecified lack of coordination: Secondary | ICD-10-CM | POA: Diagnosis not present

## 2022-11-04 DIAGNOSIS — M6281 Muscle weakness (generalized): Secondary | ICD-10-CM | POA: Diagnosis not present

## 2022-11-04 DIAGNOSIS — R279 Unspecified lack of coordination: Secondary | ICD-10-CM | POA: Diagnosis not present

## 2022-11-05 DIAGNOSIS — F01B11 Vascular dementia, moderate, with agitation: Secondary | ICD-10-CM | POA: Diagnosis not present

## 2022-11-05 DIAGNOSIS — R279 Unspecified lack of coordination: Secondary | ICD-10-CM | POA: Diagnosis not present

## 2022-11-05 DIAGNOSIS — I10 Essential (primary) hypertension: Secondary | ICD-10-CM | POA: Diagnosis not present

## 2022-11-05 DIAGNOSIS — N183 Chronic kidney disease, stage 3 unspecified: Secondary | ICD-10-CM | POA: Diagnosis not present

## 2022-11-05 DIAGNOSIS — M6281 Muscle weakness (generalized): Secondary | ICD-10-CM | POA: Diagnosis not present

## 2022-11-08 DIAGNOSIS — R279 Unspecified lack of coordination: Secondary | ICD-10-CM | POA: Diagnosis not present

## 2022-11-08 DIAGNOSIS — H40051 Ocular hypertension, right eye: Secondary | ICD-10-CM | POA: Diagnosis not present

## 2022-11-08 DIAGNOSIS — M6281 Muscle weakness (generalized): Secondary | ICD-10-CM | POA: Diagnosis not present

## 2022-11-08 DIAGNOSIS — Z961 Presence of intraocular lens: Secondary | ICD-10-CM | POA: Diagnosis not present

## 2022-11-09 DIAGNOSIS — M6281 Muscle weakness (generalized): Secondary | ICD-10-CM | POA: Diagnosis not present

## 2022-11-09 DIAGNOSIS — R279 Unspecified lack of coordination: Secondary | ICD-10-CM | POA: Diagnosis not present

## 2022-11-10 DIAGNOSIS — R279 Unspecified lack of coordination: Secondary | ICD-10-CM | POA: Diagnosis not present

## 2022-11-10 DIAGNOSIS — M6281 Muscle weakness (generalized): Secondary | ICD-10-CM | POA: Diagnosis not present

## 2022-11-11 DIAGNOSIS — M6281 Muscle weakness (generalized): Secondary | ICD-10-CM | POA: Diagnosis not present

## 2022-11-11 DIAGNOSIS — R279 Unspecified lack of coordination: Secondary | ICD-10-CM | POA: Diagnosis not present

## 2022-11-12 DIAGNOSIS — M6281 Muscle weakness (generalized): Secondary | ICD-10-CM | POA: Diagnosis not present

## 2022-11-12 DIAGNOSIS — R279 Unspecified lack of coordination: Secondary | ICD-10-CM | POA: Diagnosis not present

## 2022-11-15 DIAGNOSIS — M6281 Muscle weakness (generalized): Secondary | ICD-10-CM | POA: Diagnosis not present

## 2022-11-15 DIAGNOSIS — R279 Unspecified lack of coordination: Secondary | ICD-10-CM | POA: Diagnosis not present

## 2022-11-16 DIAGNOSIS — M6281 Muscle weakness (generalized): Secondary | ICD-10-CM | POA: Diagnosis not present

## 2022-11-16 DIAGNOSIS — R279 Unspecified lack of coordination: Secondary | ICD-10-CM | POA: Diagnosis not present

## 2022-11-17 DIAGNOSIS — M6281 Muscle weakness (generalized): Secondary | ICD-10-CM | POA: Diagnosis not present

## 2022-11-17 DIAGNOSIS — R279 Unspecified lack of coordination: Secondary | ICD-10-CM | POA: Diagnosis not present

## 2022-11-18 DIAGNOSIS — R279 Unspecified lack of coordination: Secondary | ICD-10-CM | POA: Diagnosis not present

## 2022-11-18 DIAGNOSIS — M6281 Muscle weakness (generalized): Secondary | ICD-10-CM | POA: Diagnosis not present

## 2022-11-19 DIAGNOSIS — M6281 Muscle weakness (generalized): Secondary | ICD-10-CM | POA: Diagnosis not present

## 2022-11-19 DIAGNOSIS — R279 Unspecified lack of coordination: Secondary | ICD-10-CM | POA: Diagnosis not present

## 2022-11-19 DIAGNOSIS — F411 Generalized anxiety disorder: Secondary | ICD-10-CM | POA: Diagnosis not present

## 2022-11-19 DIAGNOSIS — F331 Major depressive disorder, recurrent, moderate: Secondary | ICD-10-CM | POA: Diagnosis not present

## 2022-11-22 DIAGNOSIS — R279 Unspecified lack of coordination: Secondary | ICD-10-CM | POA: Diagnosis not present

## 2022-11-22 DIAGNOSIS — M6281 Muscle weakness (generalized): Secondary | ICD-10-CM | POA: Diagnosis not present

## 2022-11-23 DIAGNOSIS — M6281 Muscle weakness (generalized): Secondary | ICD-10-CM | POA: Diagnosis not present

## 2022-11-23 DIAGNOSIS — R279 Unspecified lack of coordination: Secondary | ICD-10-CM | POA: Diagnosis not present

## 2022-11-24 DIAGNOSIS — E785 Hyperlipidemia, unspecified: Secondary | ICD-10-CM | POA: Diagnosis not present

## 2022-11-24 DIAGNOSIS — F0390 Unspecified dementia without behavioral disturbance: Secondary | ICD-10-CM | POA: Diagnosis not present

## 2022-11-24 DIAGNOSIS — R279 Unspecified lack of coordination: Secondary | ICD-10-CM | POA: Diagnosis not present

## 2022-11-24 DIAGNOSIS — I10 Essential (primary) hypertension: Secondary | ICD-10-CM | POA: Diagnosis not present

## 2022-11-24 DIAGNOSIS — M6281 Muscle weakness (generalized): Secondary | ICD-10-CM | POA: Diagnosis not present

## 2022-11-24 DIAGNOSIS — F331 Major depressive disorder, recurrent, moderate: Secondary | ICD-10-CM | POA: Diagnosis not present

## 2022-11-24 DIAGNOSIS — F419 Anxiety disorder, unspecified: Secondary | ICD-10-CM | POA: Diagnosis not present

## 2022-11-25 DIAGNOSIS — I10 Essential (primary) hypertension: Secondary | ICD-10-CM | POA: Diagnosis not present

## 2022-11-25 DIAGNOSIS — D649 Anemia, unspecified: Secondary | ICD-10-CM | POA: Diagnosis not present

## 2022-11-29 DIAGNOSIS — E875 Hyperkalemia: Secondary | ICD-10-CM | POA: Diagnosis not present

## 2022-11-29 DIAGNOSIS — E039 Hypothyroidism, unspecified: Secondary | ICD-10-CM | POA: Diagnosis not present

## 2022-11-29 DIAGNOSIS — I10 Essential (primary) hypertension: Secondary | ICD-10-CM | POA: Diagnosis not present

## 2022-12-06 DIAGNOSIS — R062 Wheezing: Secondary | ICD-10-CM | POA: Diagnosis not present

## 2022-12-06 DIAGNOSIS — J189 Pneumonia, unspecified organism: Secondary | ICD-10-CM | POA: Diagnosis not present

## 2022-12-06 DIAGNOSIS — R0989 Other specified symptoms and signs involving the circulatory and respiratory systems: Secondary | ICD-10-CM | POA: Diagnosis not present

## 2022-12-06 DIAGNOSIS — R059 Cough, unspecified: Secondary | ICD-10-CM | POA: Diagnosis not present

## 2022-12-06 DIAGNOSIS — R0902 Hypoxemia: Secondary | ICD-10-CM | POA: Diagnosis not present

## 2022-12-07 DIAGNOSIS — I10 Essential (primary) hypertension: Secondary | ICD-10-CM | POA: Diagnosis not present

## 2022-12-07 DIAGNOSIS — I5089 Other heart failure: Secondary | ICD-10-CM | POA: Diagnosis not present

## 2022-12-09 DIAGNOSIS — F411 Generalized anxiety disorder: Secondary | ICD-10-CM | POA: Diagnosis not present

## 2022-12-09 DIAGNOSIS — F331 Major depressive disorder, recurrent, moderate: Secondary | ICD-10-CM | POA: Diagnosis not present

## 2022-12-09 DIAGNOSIS — F0390 Unspecified dementia without behavioral disturbance: Secondary | ICD-10-CM | POA: Diagnosis not present

## 2022-12-10 DIAGNOSIS — Z961 Presence of intraocular lens: Secondary | ICD-10-CM | POA: Diagnosis not present

## 2022-12-10 DIAGNOSIS — H40051 Ocular hypertension, right eye: Secondary | ICD-10-CM | POA: Diagnosis not present

## 2022-12-21 DIAGNOSIS — Z880 Allergy status to penicillin: Secondary | ICD-10-CM | POA: Diagnosis not present

## 2022-12-21 DIAGNOSIS — Z886 Allergy status to analgesic agent status: Secondary | ICD-10-CM | POA: Diagnosis not present

## 2022-12-21 DIAGNOSIS — Z882 Allergy status to sulfonamides status: Secondary | ICD-10-CM | POA: Diagnosis not present

## 2022-12-21 DIAGNOSIS — Z888 Allergy status to other drugs, medicaments and biological substances status: Secondary | ICD-10-CM | POA: Diagnosis not present

## 2022-12-21 DIAGNOSIS — Z885 Allergy status to narcotic agent status: Secondary | ICD-10-CM | POA: Diagnosis not present

## 2022-12-21 DIAGNOSIS — R899 Unspecified abnormal finding in specimens from other organs, systems and tissues: Secondary | ICD-10-CM | POA: Diagnosis not present

## 2022-12-21 DIAGNOSIS — E875 Hyperkalemia: Secondary | ICD-10-CM | POA: Diagnosis not present

## 2022-12-21 DIAGNOSIS — I10 Essential (primary) hypertension: Secondary | ICD-10-CM | POA: Diagnosis not present

## 2022-12-22 DIAGNOSIS — E875 Hyperkalemia: Secondary | ICD-10-CM | POA: Diagnosis not present

## 2022-12-23 DIAGNOSIS — F0390 Unspecified dementia without behavioral disturbance: Secondary | ICD-10-CM | POA: Diagnosis not present

## 2022-12-23 DIAGNOSIS — F411 Generalized anxiety disorder: Secondary | ICD-10-CM | POA: Diagnosis not present

## 2022-12-23 DIAGNOSIS — F331 Major depressive disorder, recurrent, moderate: Secondary | ICD-10-CM | POA: Diagnosis not present

## 2022-12-28 DIAGNOSIS — E875 Hyperkalemia: Secondary | ICD-10-CM | POA: Diagnosis not present

## 2022-12-28 DIAGNOSIS — I10 Essential (primary) hypertension: Secondary | ICD-10-CM | POA: Diagnosis not present

## 2022-12-28 DIAGNOSIS — E785 Hyperlipidemia, unspecified: Secondary | ICD-10-CM | POA: Diagnosis not present

## 2023-01-03 DIAGNOSIS — I7091 Generalized atherosclerosis: Secondary | ICD-10-CM | POA: Diagnosis not present

## 2023-01-03 DIAGNOSIS — B351 Tinea unguium: Secondary | ICD-10-CM | POA: Diagnosis not present

## 2023-01-06 DIAGNOSIS — F331 Major depressive disorder, recurrent, moderate: Secondary | ICD-10-CM | POA: Diagnosis not present

## 2023-01-06 DIAGNOSIS — F0390 Unspecified dementia without behavioral disturbance: Secondary | ICD-10-CM | POA: Diagnosis not present

## 2023-01-06 DIAGNOSIS — F411 Generalized anxiety disorder: Secondary | ICD-10-CM | POA: Diagnosis not present

## 2023-01-07 DIAGNOSIS — I10 Essential (primary) hypertension: Secondary | ICD-10-CM | POA: Diagnosis not present

## 2023-01-07 DIAGNOSIS — N390 Urinary tract infection, site not specified: Secondary | ICD-10-CM | POA: Diagnosis not present

## 2023-01-10 DIAGNOSIS — I10 Essential (primary) hypertension: Secondary | ICD-10-CM | POA: Diagnosis not present

## 2023-01-10 DIAGNOSIS — A000 Cholera due to Vibrio cholerae 01, biovar cholerae: Secondary | ICD-10-CM | POA: Diagnosis not present

## 2023-01-20 DIAGNOSIS — N390 Urinary tract infection, site not specified: Secondary | ICD-10-CM | POA: Diagnosis not present

## 2023-01-20 DIAGNOSIS — F331 Major depressive disorder, recurrent, moderate: Secondary | ICD-10-CM | POA: Diagnosis not present

## 2023-01-20 DIAGNOSIS — F411 Generalized anxiety disorder: Secondary | ICD-10-CM | POA: Diagnosis not present

## 2023-01-20 DIAGNOSIS — R41841 Cognitive communication deficit: Secondary | ICD-10-CM | POA: Diagnosis not present

## 2023-01-20 DIAGNOSIS — R262 Difficulty in walking, not elsewhere classified: Secondary | ICD-10-CM | POA: Diagnosis not present

## 2023-01-20 DIAGNOSIS — M6281 Muscle weakness (generalized): Secondary | ICD-10-CM | POA: Diagnosis not present

## 2023-01-20 DIAGNOSIS — F0390 Unspecified dementia without behavioral disturbance: Secondary | ICD-10-CM | POA: Diagnosis not present

## 2023-01-22 DIAGNOSIS — F331 Major depressive disorder, recurrent, moderate: Secondary | ICD-10-CM | POA: Diagnosis not present

## 2023-01-22 DIAGNOSIS — F411 Generalized anxiety disorder: Secondary | ICD-10-CM | POA: Diagnosis not present

## 2023-01-28 DIAGNOSIS — E039 Hypothyroidism, unspecified: Secondary | ICD-10-CM | POA: Diagnosis not present

## 2023-01-28 DIAGNOSIS — D649 Anemia, unspecified: Secondary | ICD-10-CM | POA: Diagnosis not present

## 2023-01-31 ENCOUNTER — Other Ambulatory Visit (HOSPITAL_COMMUNITY): Payer: Self-pay | Admitting: Nephrology

## 2023-01-31 DIAGNOSIS — I129 Hypertensive chronic kidney disease with stage 1 through stage 4 chronic kidney disease, or unspecified chronic kidney disease: Secondary | ICD-10-CM | POA: Diagnosis not present

## 2023-01-31 DIAGNOSIS — E211 Secondary hyperparathyroidism, not elsewhere classified: Secondary | ICD-10-CM | POA: Diagnosis not present

## 2023-01-31 DIAGNOSIS — R809 Proteinuria, unspecified: Secondary | ICD-10-CM | POA: Diagnosis not present

## 2023-01-31 DIAGNOSIS — N1832 Chronic kidney disease, stage 3b: Secondary | ICD-10-CM | POA: Diagnosis not present

## 2023-01-31 DIAGNOSIS — R600 Localized edema: Secondary | ICD-10-CM

## 2023-02-03 DIAGNOSIS — F0390 Unspecified dementia without behavioral disturbance: Secondary | ICD-10-CM | POA: Diagnosis not present

## 2023-02-03 DIAGNOSIS — F411 Generalized anxiety disorder: Secondary | ICD-10-CM | POA: Diagnosis not present

## 2023-02-03 DIAGNOSIS — F331 Major depressive disorder, recurrent, moderate: Secondary | ICD-10-CM | POA: Diagnosis not present

## 2023-02-09 DIAGNOSIS — R531 Weakness: Secondary | ICD-10-CM | POA: Diagnosis not present

## 2023-02-09 DIAGNOSIS — E215 Disorder of parathyroid gland, unspecified: Secondary | ICD-10-CM | POA: Diagnosis not present

## 2023-02-09 DIAGNOSIS — I10 Essential (primary) hypertension: Secondary | ICD-10-CM | POA: Diagnosis not present

## 2023-02-09 DIAGNOSIS — E039 Hypothyroidism, unspecified: Secondary | ICD-10-CM | POA: Diagnosis not present

## 2023-02-09 DIAGNOSIS — D649 Anemia, unspecified: Secondary | ICD-10-CM | POA: Diagnosis not present

## 2023-02-10 DIAGNOSIS — R41841 Cognitive communication deficit: Secondary | ICD-10-CM | POA: Diagnosis not present

## 2023-02-10 DIAGNOSIS — R1314 Dysphagia, pharyngoesophageal phase: Secondary | ICD-10-CM | POA: Diagnosis not present

## 2023-02-11 DIAGNOSIS — R1314 Dysphagia, pharyngoesophageal phase: Secondary | ICD-10-CM | POA: Diagnosis not present

## 2023-02-11 DIAGNOSIS — R41841 Cognitive communication deficit: Secondary | ICD-10-CM | POA: Diagnosis not present

## 2023-02-14 DIAGNOSIS — R1314 Dysphagia, pharyngoesophageal phase: Secondary | ICD-10-CM | POA: Diagnosis not present

## 2023-02-14 DIAGNOSIS — N390 Urinary tract infection, site not specified: Secondary | ICD-10-CM | POA: Diagnosis not present

## 2023-02-14 DIAGNOSIS — D631 Anemia in chronic kidney disease: Secondary | ICD-10-CM | POA: Diagnosis not present

## 2023-02-14 DIAGNOSIS — E875 Hyperkalemia: Secondary | ICD-10-CM | POA: Diagnosis not present

## 2023-02-14 DIAGNOSIS — R41841 Cognitive communication deficit: Secondary | ICD-10-CM | POA: Diagnosis not present

## 2023-02-15 DIAGNOSIS — R0989 Other specified symptoms and signs involving the circulatory and respiratory systems: Secondary | ICD-10-CM | POA: Diagnosis not present

## 2023-02-15 DIAGNOSIS — R059 Cough, unspecified: Secondary | ICD-10-CM | POA: Diagnosis not present

## 2023-02-15 DIAGNOSIS — R1314 Dysphagia, pharyngoesophageal phase: Secondary | ICD-10-CM | POA: Diagnosis not present

## 2023-02-15 DIAGNOSIS — R41841 Cognitive communication deficit: Secondary | ICD-10-CM | POA: Diagnosis not present

## 2023-02-16 ENCOUNTER — Emergency Department (HOSPITAL_COMMUNITY): Payer: Medicare Other

## 2023-02-16 ENCOUNTER — Other Ambulatory Visit: Payer: Self-pay

## 2023-02-16 ENCOUNTER — Observation Stay (HOSPITAL_COMMUNITY): Payer: Medicare Other

## 2023-02-16 ENCOUNTER — Inpatient Hospital Stay (HOSPITAL_COMMUNITY)
Admission: EM | Admit: 2023-02-16 | Discharge: 2023-02-21 | DRG: 689 | Disposition: A | Discharge status: Skilled Nursing Facility | Payer: Medicare Other | Source: Skilled Nursing Facility | Attending: Family Medicine | Admitting: Family Medicine

## 2023-02-16 DIAGNOSIS — N3281 Overactive bladder: Secondary | ICD-10-CM | POA: Diagnosis present

## 2023-02-16 DIAGNOSIS — Z79890 Hormone replacement therapy (postmenopausal): Secondary | ICD-10-CM | POA: Diagnosis not present

## 2023-02-16 DIAGNOSIS — Z888 Allergy status to other drugs, medicaments and biological substances status: Secondary | ICD-10-CM | POA: Diagnosis not present

## 2023-02-16 DIAGNOSIS — Z66 Do not resuscitate: Secondary | ICD-10-CM | POA: Diagnosis present

## 2023-02-16 DIAGNOSIS — Z91041 Radiographic dye allergy status: Secondary | ICD-10-CM | POA: Diagnosis not present

## 2023-02-16 DIAGNOSIS — N3000 Acute cystitis without hematuria: Secondary | ICD-10-CM

## 2023-02-16 DIAGNOSIS — Z882 Allergy status to sulfonamides status: Secondary | ICD-10-CM | POA: Diagnosis not present

## 2023-02-16 DIAGNOSIS — Z885 Allergy status to narcotic agent status: Secondary | ICD-10-CM | POA: Diagnosis not present

## 2023-02-16 DIAGNOSIS — F0390 Unspecified dementia without behavioral disturbance: Secondary | ICD-10-CM | POA: Diagnosis not present

## 2023-02-16 DIAGNOSIS — Z886 Allergy status to analgesic agent status: Secondary | ICD-10-CM | POA: Diagnosis not present

## 2023-02-16 DIAGNOSIS — Z1152 Encounter for screening for COVID-19: Secondary | ICD-10-CM | POA: Diagnosis not present

## 2023-02-16 DIAGNOSIS — R569 Unspecified convulsions: Secondary | ICD-10-CM | POA: Diagnosis present

## 2023-02-16 DIAGNOSIS — X58XXXA Exposure to other specified factors, initial encounter: Secondary | ICD-10-CM | POA: Diagnosis present

## 2023-02-16 DIAGNOSIS — Z880 Allergy status to penicillin: Secondary | ICD-10-CM | POA: Diagnosis not present

## 2023-02-16 DIAGNOSIS — F331 Major depressive disorder, recurrent, moderate: Secondary | ICD-10-CM | POA: Diagnosis present

## 2023-02-16 DIAGNOSIS — N390 Urinary tract infection, site not specified: Secondary | ICD-10-CM | POA: Diagnosis present

## 2023-02-16 DIAGNOSIS — K219 Gastro-esophageal reflux disease without esophagitis: Secondary | ICD-10-CM | POA: Diagnosis present

## 2023-02-16 DIAGNOSIS — Z91013 Allergy to seafood: Secondary | ICD-10-CM

## 2023-02-16 DIAGNOSIS — R410 Disorientation, unspecified: Secondary | ICD-10-CM | POA: Diagnosis not present

## 2023-02-16 DIAGNOSIS — S43014A Anterior dislocation of right humerus, initial encounter: Secondary | ICD-10-CM | POA: Diagnosis not present

## 2023-02-16 DIAGNOSIS — E039 Hypothyroidism, unspecified: Secondary | ICD-10-CM | POA: Diagnosis present

## 2023-02-16 DIAGNOSIS — R404 Transient alteration of awareness: Secondary | ICD-10-CM | POA: Diagnosis not present

## 2023-02-16 DIAGNOSIS — E441 Mild protein-calorie malnutrition: Secondary | ICD-10-CM | POA: Diagnosis present

## 2023-02-16 DIAGNOSIS — G9341 Metabolic encephalopathy: Secondary | ICD-10-CM | POA: Diagnosis present

## 2023-02-16 DIAGNOSIS — D649 Anemia, unspecified: Secondary | ICD-10-CM | POA: Diagnosis present

## 2023-02-16 DIAGNOSIS — B9620 Unspecified Escherichia coli [E. coli] as the cause of diseases classified elsewhere: Secondary | ICD-10-CM | POA: Diagnosis present

## 2023-02-16 DIAGNOSIS — I10 Essential (primary) hypertension: Secondary | ICD-10-CM | POA: Diagnosis not present

## 2023-02-16 DIAGNOSIS — R0902 Hypoxemia: Secondary | ICD-10-CM | POA: Diagnosis not present

## 2023-02-16 DIAGNOSIS — H26493 Other secondary cataract, bilateral: Secondary | ICD-10-CM | POA: Diagnosis present

## 2023-02-16 DIAGNOSIS — F419 Anxiety disorder, unspecified: Secondary | ICD-10-CM | POA: Diagnosis present

## 2023-02-16 DIAGNOSIS — Z801 Family history of malignant neoplasm of trachea, bronchus and lung: Secondary | ICD-10-CM

## 2023-02-16 DIAGNOSIS — E785 Hyperlipidemia, unspecified: Secondary | ICD-10-CM | POA: Diagnosis present

## 2023-02-16 DIAGNOSIS — F01B11 Vascular dementia, moderate, with agitation: Secondary | ICD-10-CM | POA: Diagnosis not present

## 2023-02-16 DIAGNOSIS — M6281 Muscle weakness (generalized): Secondary | ICD-10-CM | POA: Diagnosis present

## 2023-02-16 DIAGNOSIS — N3001 Acute cystitis with hematuria: Principal | ICD-10-CM

## 2023-02-16 DIAGNOSIS — G629 Polyneuropathy, unspecified: Secondary | ICD-10-CM | POA: Diagnosis present

## 2023-02-16 DIAGNOSIS — R5381 Other malaise: Secondary | ICD-10-CM | POA: Diagnosis not present

## 2023-02-16 DIAGNOSIS — R1319 Other dysphagia: Secondary | ICD-10-CM | POA: Diagnosis present

## 2023-02-16 DIAGNOSIS — Z4789 Encounter for other orthopedic aftercare: Secondary | ICD-10-CM | POA: Diagnosis not present

## 2023-02-16 DIAGNOSIS — H40051 Ocular hypertension, right eye: Secondary | ICD-10-CM | POA: Diagnosis present

## 2023-02-16 DIAGNOSIS — Z8249 Family history of ischemic heart disease and other diseases of the circulatory system: Secondary | ICD-10-CM

## 2023-02-16 DIAGNOSIS — R1312 Dysphagia, oropharyngeal phase: Secondary | ICD-10-CM | POA: Diagnosis present

## 2023-02-16 DIAGNOSIS — Z79899 Other long term (current) drug therapy: Secondary | ICD-10-CM | POA: Diagnosis not present

## 2023-02-16 DIAGNOSIS — K30 Functional dyspepsia: Secondary | ICD-10-CM | POA: Diagnosis present

## 2023-02-16 DIAGNOSIS — F411 Generalized anxiety disorder: Secondary | ICD-10-CM | POA: Diagnosis not present

## 2023-02-16 DIAGNOSIS — R2689 Other abnormalities of gait and mobility: Secondary | ICD-10-CM | POA: Diagnosis present

## 2023-02-16 DIAGNOSIS — S43084A Other dislocation of right shoulder joint, initial encounter: Secondary | ICD-10-CM | POA: Diagnosis not present

## 2023-02-16 DIAGNOSIS — S43014D Anterior dislocation of right humerus, subsequent encounter: Secondary | ICD-10-CM | POA: Diagnosis not present

## 2023-02-16 DIAGNOSIS — E860 Dehydration: Secondary | ICD-10-CM | POA: Diagnosis present

## 2023-02-16 DIAGNOSIS — N179 Acute kidney failure, unspecified: Secondary | ICD-10-CM | POA: Diagnosis present

## 2023-02-16 DIAGNOSIS — Z743 Need for continuous supervision: Secondary | ICD-10-CM | POA: Diagnosis not present

## 2023-02-16 DIAGNOSIS — E7800 Pure hypercholesterolemia, unspecified: Secondary | ICD-10-CM | POA: Diagnosis present

## 2023-02-16 DIAGNOSIS — M25521 Pain in right elbow: Secondary | ICD-10-CM | POA: Diagnosis not present

## 2023-02-16 DIAGNOSIS — R4182 Altered mental status, unspecified: Secondary | ICD-10-CM | POA: Diagnosis not present

## 2023-02-16 DIAGNOSIS — R112 Nausea with vomiting, unspecified: Secondary | ICD-10-CM | POA: Diagnosis present

## 2023-02-16 DIAGNOSIS — S43004A Unspecified dislocation of right shoulder joint, initial encounter: Secondary | ICD-10-CM | POA: Diagnosis present

## 2023-02-16 DIAGNOSIS — D631 Anemia in chronic kidney disease: Secondary | ICD-10-CM | POA: Diagnosis present

## 2023-02-16 DIAGNOSIS — R531 Weakness: Secondary | ICD-10-CM | POA: Diagnosis not present

## 2023-02-16 DIAGNOSIS — Z7401 Bed confinement status: Secondary | ICD-10-CM | POA: Diagnosis not present

## 2023-02-16 LAB — CBC WITH DIFFERENTIAL/PLATELET
Abs Immature Granulocytes: 0.02 10*3/uL (ref 0.00–0.07)
Basophils Absolute: 0 10*3/uL (ref 0.0–0.1)
Basophils Relative: 0 %
Eosinophils Absolute: 0.1 10*3/uL (ref 0.0–0.5)
Eosinophils Relative: 3 %
HCT: 34.3 % — ABNORMAL LOW (ref 36.0–46.0)
Hemoglobin: 10.9 g/dL — ABNORMAL LOW (ref 12.0–15.0)
Immature Granulocytes: 0 %
Lymphocytes Relative: 29 %
Lymphs Abs: 1.3 10*3/uL (ref 0.7–4.0)
MCH: 28.6 pg (ref 26.0–34.0)
MCHC: 31.8 g/dL (ref 30.0–36.0)
MCV: 90 fL (ref 80.0–100.0)
Monocytes Absolute: 0.5 10*3/uL (ref 0.1–1.0)
Monocytes Relative: 11 %
Neutro Abs: 2.6 10*3/uL (ref 1.7–7.7)
Neutrophils Relative %: 57 %
Platelets: 223 10*3/uL (ref 150–400)
RBC: 3.81 MIL/uL — ABNORMAL LOW (ref 3.87–5.11)
RDW: 14.2 % (ref 11.5–15.5)
WBC: 4.5 10*3/uL (ref 4.0–10.5)
nRBC: 0 % (ref 0.0–0.2)

## 2023-02-16 LAB — URINALYSIS, W/ REFLEX TO CULTURE (INFECTION SUSPECTED)
Bilirubin Urine: NEGATIVE
Glucose, UA: NEGATIVE mg/dL
Ketones, ur: NEGATIVE mg/dL
Nitrite: POSITIVE — AB
Protein, ur: 30 mg/dL — AB
Specific Gravity, Urine: 1.008 (ref 1.005–1.030)
WBC, UA: >50 WBC/hpf (ref 0–5)
pH: 6 (ref 5.0–8.0)

## 2023-02-16 LAB — COMPREHENSIVE METABOLIC PANEL
ALT: 13 U/L (ref 0–44)
AST: 14 U/L — ABNORMAL LOW (ref 15–41)
Albumin: 3 g/dL — ABNORMAL LOW (ref 3.5–5.0)
Alkaline Phosphatase: 77 U/L (ref 38–126)
Anion gap: 16 — ABNORMAL HIGH (ref 5–15)
BUN: 47 mg/dL — ABNORMAL HIGH (ref 8–23)
CO2: 25 mmol/L (ref 22–32)
Calcium: 8.6 mg/dL — ABNORMAL LOW (ref 8.9–10.3)
Chloride: 95 mmol/L — ABNORMAL LOW (ref 98–111)
Creatinine, Ser: 1.89 mg/dL — ABNORMAL HIGH (ref 0.44–1.00)
GFR, Estimated: 26 mL/min — ABNORMAL LOW (ref >60)
Glucose, Bld: 92 mg/dL (ref 70–99)
Potassium: 4 mmol/L (ref 3.5–5.1)
Sodium: 136 mmol/L (ref 135–145)
Total Bilirubin: 0.8 mg/dL (ref 0.3–1.2)
Total Protein: 6.2 g/dL — ABNORMAL LOW (ref 6.5–8.1)

## 2023-02-16 LAB — CULTURE, BLOOD (ROUTINE X 2): Special Requests: ADEQUATE

## 2023-02-16 LAB — LACTIC ACID, PLASMA
Lactic Acid, Venous: 0.5 mmol/L (ref 0.5–1.9)
Lactic Acid, Venous: 0.5 mmol/L (ref 0.5–1.9)

## 2023-02-16 LAB — PROTIME-INR
INR: 1.1 (ref 0.8–1.2)
Prothrombin Time: 14.4 seconds (ref 11.4–15.2)

## 2023-02-16 LAB — RESP PANEL BY RT-PCR (RSV, FLU A&B, COVID)  RVPGX2
Influenza A by PCR: NEGATIVE
Influenza B by PCR: NEGATIVE
Resp Syncytial Virus by PCR: NEGATIVE
SARS Coronavirus 2 by RT PCR: NEGATIVE

## 2023-02-16 LAB — MRSA NEXT GEN BY PCR, NASAL: MRSA by PCR Next Gen: NOT DETECTED

## 2023-02-16 LAB — APTT: aPTT: 32 seconds (ref 24–36)

## 2023-02-16 MED ORDER — SODIUM CHLORIDE 0.9 % IV SOLN
2.0000 g | Freq: Once | INTRAVENOUS | Status: AC
  Administered 2023-02-16: 2 g via INTRAVENOUS
  Filled 2023-02-16: qty 20

## 2023-02-16 MED ORDER — ACETAMINOPHEN 325 MG PO TABS
650.0000 mg | ORAL_TABLET | Freq: Four times a day (QID) | ORAL | Status: DC | PRN
  Administered 2023-02-18 – 2023-02-20 (×3): 650 mg via ORAL
  Filled 2023-02-16 (×3): qty 2

## 2023-02-16 MED ORDER — ONDANSETRON HCL 4 MG PO TABS: 4.0000 mg | ORAL_TABLET | Freq: Four times a day (QID) | ORAL | Status: DC | PRN

## 2023-02-16 MED ORDER — SODIUM CHLORIDE 0.9 % IV SOLN
1.0000 g | INTRAVENOUS | Status: AC
  Administered 2023-02-17 – 2023-02-20 (×4): 1 g via INTRAVENOUS
  Filled 2023-02-16 (×4): qty 10

## 2023-02-16 MED ORDER — SODIUM CHLORIDE 0.9 % IV BOLUS
1000.0000 mL | Freq: Once | INTRAVENOUS | Status: AC
  Administered 2023-02-16: 1000 mL via INTRAVENOUS

## 2023-02-16 MED ORDER — ONDANSETRON HCL 4 MG/2ML IJ SOLN: 4.0000 mg | Freq: Four times a day (QID) | INTRAMUSCULAR | Status: DC | PRN

## 2023-02-16 MED ORDER — ACETAMINOPHEN 650 MG RE SUPP: 650.0000 mg | Freq: Four times a day (QID) | RECTAL | Status: DC | PRN

## 2023-02-16 MED ORDER — POLYETHYLENE GLYCOL 3350 17 G PO PACK: 17.0000 g | PACK | Freq: Every day | ORAL | Status: DC | PRN

## 2023-02-16 MED ORDER — SODIUM CHLORIDE 0.9 % IV SOLN: INTRAVENOUS | Status: AC

## 2023-02-16 MED ORDER — HEPARIN SODIUM (PORCINE) 5000 UNIT/ML IJ SOLN
5000.0000 [IU] | Freq: Three times a day (TID) | INTRAMUSCULAR | Status: DC
  Administered 2023-02-16 – 2023-02-21 (×14): 5000 [IU] via SUBCUTANEOUS
  Filled 2023-02-16 (×14): qty 1

## 2023-02-16 NOTE — Assessment & Plan Note (Addendum)
Pending med reconciliation resume atenolol

## 2023-02-16 NOTE — ED Provider Notes (Signed)
Nicole Yoder EMERGENCY DEPARTMENT AT [facility] Provider Note   CSN: [id number] Arrival date & time: 02/16/23  1027     History  Chief Complaint  Patient presents with   Altered Mental Status    Nicole Yoder is a 87 y.o. female.  Patient has a history of hypertension and gait problems.  Patient has been more confused at the nursing home  The history is provided by the patient and medical records. No language interpreter was used.  Altered Mental Status Presenting symptoms: behavior changes and confusion   Severity:  Moderate Episode history:  Continuous Timing:  Constant Progression:  Worsening Chronicity:  New Context: dementia   Context: not alcohol use   Associated symptoms: no abdominal pain, no hallucinations, no headaches, no rash and no seizures        Home Medications Prior to Admission medications   Medication Sig Start Date End Date Taking? Authorizing Provider  acetaminophen (TYLENOL) 500 MG tablet Take 650 mg by mouth 2 (two) times daily.    [provider]  atenolol (TENORMIN) 25 MG tablet Take 25 mg by mouth daily.    [provider]  Cholecalciferol (VITAMIN D PO) Take 1,000 Units by mouth daily.     [provider]  Cyanocobalamin (VITAMIN B 12) 100 MCG LOZG Take 1,000 mcg by mouth daily.    [provider]  diclofenac sodium (VOLTAREN) 1 % GEL Apply 2 g topically as needed.     [provider]  levothyroxine (SYNTHROID, LEVOTHROID) 75 MCG tablet Take 75 mcg by mouth daily before breakfast.    [provider]  meloxicam (MOBIC) 15 MG tablet Take 15 mg by mouth as needed.     [provider]  mirabegron ER (MYRBETRIQ) 25 MG TB24 tablet Take 25 mg by mouth daily. Reported on 01/22/2016    [provider]      Allergies    Prednisone, Celebrex [celecoxib], Codeine, Lyrica [pregabalin], Neurontin [gabapentin], Penicillins, Statins, Sulfa antibiotics, Tramadol, and Ivp dye  [iodinated contrast media]    Review of Systems   Review of Systems  Constitutional:  Negative for appetite change and fatigue.  HENT:  Negative for congestion, ear discharge and sinus pressure.   Eyes:  Negative for discharge.  Respiratory:  Negative for cough.   Cardiovascular:  Negative for chest pain.  Gastrointestinal:  Negative for abdominal pain and diarrhea.  Genitourinary:  Negative for frequency and hematuria.  Musculoskeletal:  Negative for back pain.  Skin:  Negative for rash.  Neurological:  Negative for seizures and headaches.  Psychiatric/Behavioral:  Positive for confusion. Negative for hallucinations.     Physical Exam Updated Vital Signs BP 130/64   Pulse (!) 58   Temp 97.8 F (36.6 C) (Oral)   Resp 13   Ht 5\' 1"  (1.549 m)   Wt 67.7 kg   SpO2 90%   BMI 28.20 kg/m  Physical Exam Vitals and nursing note reviewed.  Constitutional:      Appearance: She is well-developed.     Comments: Lethargic  HENT:     Head: Normocephalic.     Nose: Nose normal.  Eyes:     General: No scleral icterus.    Conjunctiva/sclera: Conjunctivae normal.  Neck:     Thyroid: No thyromegaly.  Cardiovascular:     Rate and Rhythm: Normal rate and regular rhythm.     Heart sounds: No murmur heard.    No friction rub. No gallop.  Pulmonary:  Breath sounds: No stridor. No wheezing or rales.  Chest:     Chest wall: No tenderness.  Abdominal:     General: There is no distension.     Tenderness: There is no abdominal tenderness. There is no rebound.  Musculoskeletal:        General: Normal range of motion.     Cervical back: Neck supple.  Lymphadenopathy:     Cervical: No cervical adenopathy.  Skin:    Findings: No erythema or rash.  Neurological:     Motor: No abnormal muscle tone.     Coordination: Coordination normal.     Comments: Oriented to person place only  Psychiatric:        Behavior: Behavior normal.     ED Results / Procedures / Treatments   Labs (all  labs ordered are listed, but only abnormal results are displayed) Labs Reviewed  COMPREHENSIVE METABOLIC PANEL - Abnormal; Notable for the following components:      Result Value   Chloride 95 (*)    BUN 47 (*)    Creatinine, Ser 1.89 (*)    Calcium 8.6 (*)    Total Protein 6.2 (*)    Albumin 3.0 (*)    AST 14 (*)    GFR, Estimated 26 (*)    Anion gap 16 (*)    All other components within normal limits  CBC WITH DIFFERENTIAL/PLATELET - Abnormal; Notable for the following components:   RBC 3.81 (*)    Hemoglobin 10.9 (*)    HCT 34.3 (*)    All other components within normal limits  URINALYSIS, W/ REFLEX TO CULTURE (INFECTION SUSPECTED) - Abnormal; Notable for the following components:   APPearance TURBID (*)    Hgb urine dipstick SMALL (*)    Protein, ur 30 (*)    Nitrite POSITIVE (*)    Leukocytes,Ua LARGE (*)    Bacteria, UA FEW (*)    All other components within normal limits  RESP PANEL BY RT-PCR (RSV, FLU A&B, COVID)  RVPGX2  CULTURE, BLOOD (ROUTINE X 2)  CULTURE, BLOOD (ROUTINE X 2)  URINE CULTURE  LACTIC ACID, PLASMA  LACTIC ACID, PLASMA  PROTIME-INR  APTT    EKG None  Radiology DG Chest Port 1 View  Result Date: 02/16/2023 CLINICAL DATA:  Four day history of altered mental status EXAM: PORTABLE CHEST 1 VIEW COMPARISON:  None Available. FINDINGS: Low lung volumes with bronchovascular crowding. No focal consolidations. No pleural effusion or pneumothorax. Enlarged cardiomediastinal silhouette is likely projectional. No acute osseous abnormality. IMPRESSION: Low lung volumes with bronchovascular crowding. No focal consolidations. Electronically Signed   By: Agustin Cree M.D.   On: 02/16/2023 11:34    Procedures Procedures    Medications Ordered in ED Medications  cefTRIAXone (ROCEPHIN) 2 g in sodium chloride 0.9 % 100 mL IVPB (0 g Intravenous Stopped 02/16/23 1158)  sodium chloride 0.9 % bolus 1,000 mL (0 mLs Intravenous Stopped 02/16/23 1345)    ED Course/  Medical Decision Making/ A&P                             Medical Decision Making Amount and/or Complexity of Data Reviewed Labs: ordered. Radiology: ordered. ECG/medicine tests: ordered.  Risk Decision regarding hospitalization.   This patient presents to the ED for concern of altered mental status, this involves an extensive number of treatment options, and is a complaint that carries with it a high risk of complications and morbidity.  The differential diagnosis includes sepsis, hypoxia   Co morbidities that complicate the patient evaluation  Hypertension, dementia, gait problems   Additional history obtained:  Additional history obtained from patient External records from outside source obtained and reviewed including hospital records   Lab Tests:  I Ordered, and personally interpreted labs.  The pertinent results include: Urinalysis shows UTI   Imaging Studies ordered:  I ordered imaging studies including CT head and chest x-ray I independently visualized and interpreted imaging which showed no significant findings I agree with the radiologist interpretation   Cardiac Monitoring: / EKG:  The patient was maintained on a cardiac monitor.  I personally viewed and interpreted the cardiac monitored which showed an underlying rhythm of: Normal sinus rhythm   Consultations Obtained:  I requested consultation with the hospitalist,  and discussed lab and imaging findings as well as pertinent plan - they recommend: Admit   Problem List / ED Course / Critical interventions / Medication management  Hypertension and gait problems and dementia I ordered medication including antibiotics for UTI Reevaluation of the patient after these medicines showed that the patient stayed the same I have reviewed the patients home medicines and have made adjustments as needed   Social Determinants of Health:  None   Test / Admission - Considered:  None    Patient with  metabolic encephalopathy secondary to UTI and AKI.  She will be admitted to medicine        Final Clinical Impression(s) / ED Diagnoses Final diagnoses:  None    Rx / DC Orders ED Discharge Orders     None         Bethann Berkshire, MD 02/20/23 1327

## 2023-02-16 NOTE — ED Notes (Signed)
Patient transported to CT 

## 2023-02-16 NOTE — Assessment & Plan Note (Signed)
Creatinine 1.8, baseline about 1.1 last checked 3 years ago. - 1 L bolus given, continue normal saline 75cc/hr x 1 day

## 2023-02-16 NOTE — Assessment & Plan Note (Addendum)
Presenting with altered mental status.  UA with positive nitrites and leukocytes, few bacteria.  No urine cultures on file.  Afebrile without leukocytosis.  Rules out for sepsis. - Continiue IV ceftriaxone -Follow-up urine and blood cultures

## 2023-02-16 NOTE — Assessment & Plan Note (Addendum)
Pending med reconciliation resume Synthroid

## 2023-02-16 NOTE — ED Notes (Signed)
Brief changed, pw changed, peri care performed, pt repositioned
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
## 2023-02-16 NOTE — ED Triage Notes (Signed)
Pt arrived via [facility] from [facility] c/o AMS x 4 days. Per EMS, facility states that pt has possible URI as they have already collected a urine sample and cultures but are awaiting culture results. Pt is on doxycycline, SpO2 84% RA per EMS, not normally oxygen, placed on 2 L [place] and SpO2 96%

## 2023-02-16 NOTE — ED Notes (Signed)
Both sets of blood cultures obtained before antibiotic administration  

## 2023-02-16 NOTE — Sepsis Progress Note (Signed)
[facility] will follow per sepsis protocol  

## 2023-02-16 NOTE — Assessment & Plan Note (Signed)
Somnolent on my exam, more awake on arrival to the floor.  UA suggest UTI.  Also appears dehydrated with AKI.  Chest X-ray- no findings to suggest infection. Covid, RSV, Influenza negative. -Continue IV ceftriaxone -Follow-up blood and urine cultures done in ED -CT no acute intracranial abnormality, suggests chronic fungal sinusitis -Consider phone consult with ID for recommendations on above findings, treatment warranted or further testing needed

## 2023-02-16 NOTE — H&P (Addendum)
History and Physical    EMILLIE HOLDING ZOX:[id number] DOB: 06-Nov-1935 DOA: 02/16/2023  PCP: [name], MD   Patient coming from: [facility] rehab  I have personally briefly reviewed patient's old medical records in [facility]  Chief Complaint: AMS  HPI: CAROLLYNN CASSLER is a 87 y.o. female with medical history significant for neuropathy, anxiety, blood pressure, hypothyroidism. Patient was brought to the ED from nursing home with reports of altered mental status-over the past 4 days.  At the time of my evaluation, patient is somnolent, arouses to vigorous touch stimulation, verbalizes, closes her eyes, some of which appears to be intentional.  Not answering questions.  History obtained from chart review and EDP. Nursing home also reported URI symptoms with cough, and hypoxia, started on a course of doxycycline.  EMS reports sats of 84 % room air.  ED Course: Sats 90 to 100% on 2L, on my evaluation sats 94% on room air.  Blood pressure systolic 10 3-140.  Heart rate 50s to 60s.  Respiratory rate 12-19. WBC 4.5.  Lactic acid 0.5.  Creatinine 1.89.  UA suggestive of UTI.  Chest x-ray-acute abnormality, shows bronchovascular crowding. IV ceftriaxone 2 g started.  1 liter bolus given.  Review of Systems: Unable to assess due to altered mental status  Past Medical History:  Diagnosis Date   Anxiety    Gait abnormality    High blood pressure    High cholesterol    Neuropathy    Urinary frequency     Past Surgical History:  Procedure Laterality Date   broken ribs     broken wrist     CARPAL TUNNEL RELEASE Bilateral    EYE SURGERY     GANGLION CYST EXCISION     TONSILLECTOMY AND ADENOIDECTOMY       reports that she has never smoked. She has never used smokeless tobacco. She reports that she does not drink alcohol and does not use drugs.  Allergies  Allergen Reactions   Prednisone Other (See Comments)    High blood pressure    Celebrex [Celecoxib]    Codeine    Lyrica  [Pregabalin]    Neurontin [Gabapentin]    Penicillins    Statins Other (See Comments)    Muscle aches    Sulfa Antibiotics    Tramadol Other (See Comments)    hallucinations   Ivp Dye [Iodinated Contrast Media] Rash    Family History  Problem Relation Age of Onset   Heart attack Mother    Lung cancer Father     Prior to Admission medications   Medication Sig Start Date End Date Taking? Authorizing Provider  acetaminophen (TYLENOL) 500 MG tablet Take 650 mg by mouth 2 (two) times daily.    [provider]  atenolol (TENORMIN) 25 MG tablet Take 25 mg by mouth daily.    [provider]  Cholecalciferol (VITAMIN D PO) Take 1,000 Units by mouth daily.     [provider]  Cyanocobalamin (VITAMIN B 12) 100 MCG LOZG Take 1,000 mcg by mouth daily.    [provider]  diclofenac sodium (VOLTAREN) 1 % GEL Apply 2 g topically as needed.     [provider]  levothyroxine (SYNTHROID, LEVOTHROID) 75 MCG tablet Take 75 mcg by mouth daily before breakfast.    [provider]  meloxicam (MOBIC) 15 MG tablet Take 15 mg by mouth as needed.     [provider]  mirabegron ER (MYRBETRIQ) 25 MG TB24 tablet  Take 25 mg by mouth daily. Reported on 01/22/2016    [provider]    Physical Exam: Limited exam due to altered mental status Vitals:   02/16/23 1330 02/16/23 1430 02/16/23 1500 02/16/23 1536  BP: 117/62 120/61 130/64   Pulse: (!) 59 (!) 57 (!) 58   Resp: 15 12 13    Temp:    97.8 F (36.6 C)  TempSrc:    Oral  SpO2: 100% 97% 90%   Weight:      Height:        Constitutional: Somnolent,  Vitals:   02/16/23 1330 02/16/23 1430 02/16/23 1500 02/16/23 1536  BP: 117/62 120/61 130/64   Pulse: (!) 59 (!) 57 (!) 58   Resp: 15 12 13    Temp:    97.8 F (36.6 C)  TempSrc:    Oral  SpO2: 100% 97% 90%   Weight:      Height:       Eyes: PERRL, lids and conjunctivae normal ENMT: Mucous membranes are moist. Posterior  pharynx clear of any exudate or lesions.Normal dentition.  Neck: normal, supple, no masses, no thyromegaly Respiratory: clear to auscultation bilaterally, no wheezing, no crackles. Normal respiratory effort. No accessory muscle use.  Cardiovascular: Regular rate and rhythm, no murmurs / rubs / gallops. No extremity edema.  Extremities warm Abdomen: no tenderness, no masses palpated. No hepatosplenomegaly. Bowel sounds positive.  Musculoskeletal: no clubbing / cyanosis. No joint deformity upper and lower extremities. Good ROM, no contractures. Normal muscle tone.  Skin: no rashes, lesions, ulcers. No induration Neurologic: No facial asymmetry, okay grip strength bilateral upper extremities, not cooperative exam for lower extremities Psychiatric: Somnolent. not answering questions,.   Labs on Admission: I have personally reviewed following labs and imaging studies  CBC: Recent Labs  Lab 02/16/23 1117  WBC 4.5  NEUTROABS 2.6  HGB 10.9*  HCT 34.3*  MCV 90.0  PLT 223   Basic Metabolic Panel: Recent Labs  Lab 02/16/23 1117  NA 136  K 4.0  CL 95*  CO2 25  GLUCOSE 92  BUN 47*  CREATININE 1.89*  CALCIUM 8.6*   GFR: Estimated Creatinine Clearance: 19.2 mL/min (A) (by C-G formula based on SCr of 1.89 mg/dL (H)). Liver Function Tests: Recent Labs  Lab 02/16/23 1117  AST 14*  ALT 13  ALKPHOS 77  BILITOT 0.8  PROT 6.2*  ALBUMIN 3.0*   Coagulation Profile: Recent Labs  Lab 02/16/23 1117  INR 1.1   Urine analysis:    Component Value Date/Time   COLORURINE YELLOW 02/16/2023 1137   APPEARANCEUR TURBID (A) 02/16/2023 1137   LABSPEC 1.008 02/16/2023 1137   PHURINE 6.0 02/16/2023 1137   GLUCOSEU NEGATIVE 02/16/2023 1137   HGBUR SMALL (A) 02/16/2023 1137   BILIRUBINUR NEGATIVE 02/16/2023 1137   KETONESUR NEGATIVE 02/16/2023 1137   PROTEINUR 30 (A) 02/16/2023 1137   NITRITE POSITIVE (A) 02/16/2023 1137   LEUKOCYTESUR LARGE (A) 02/16/2023 1137    Radiological Exams on  Admission: DG Chest Port 1 View  Result Date: 02/16/2023 CLINICAL DATA:  Four day history of altered mental status EXAM: PORTABLE CHEST 1 VIEW COMPARISON:  None Available. FINDINGS: Low lung volumes with bronchovascular crowding. No focal consolidations. No pleural effusion or pneumothorax. Enlarged cardiomediastinal silhouette is likely projectional. No acute osseous abnormality. IMPRESSION: Low lung volumes with bronchovascular crowding. No focal consolidations. Electronically Signed   By: Agustin Cree M.D.   On: 02/16/2023 11:34    EKG: Independently reviewed. Pending  Assessment/Plan Principal Problem:  Acute metabolic encephalopathy Active Problems:   UTI (urinary tract infection)   AKI (acute kidney injury) (HCC)   Hypothyroidism   HTN (hypertension)  Assessment and Plan: * Acute metabolic encephalopathy Somnolent on my exam, more awake on arrival to the floor.  UA suggest UTI.  Also appears dehydrated with AKI.  Chest X-ray- no findings to suggest infection. Covid, RSV, Influenza negative. -Continue IV ceftriaxone -Follow-up blood and urine cultures done in ED -CT no acute intracranial abnormality, suggests chronic fungal sinusitis -Consider phone consult with ID for recommendations on above findings, treatment warranted or further testing needed  AKI (acute kidney injury) (HCC) Creatinine 1.8, baseline about 1.1 last checked 3 years ago. - 1 L bolus given, continue normal saline 75cc/hr x 1 day  UTI (urinary tract infection) Presenting with altered mental status.  UA with positive nitrites and leukocytes, few bacteria.  No urine cultures on file.  Afebrile without leukocytosis.  Rules out for sepsis. - Continiue IV ceftriaxone -Follow-up urine and blood cultures  HTN (hypertension) Pending med reconciliation resume atenolol  Hypothyroidism Pending med reconciliation resume Synthroid    DVT prophylaxis: heparin Code Status: DNR- nursing home documents at bedside state  DNR status, I confirmed with niece - Judeth Cornfield, who is power of attorney on the phone. Family Communication: None at bedside.  Called patient's niece Aaron Edelman the phone, updated her on patient's plan of care.  Per nursing home documents she is patients POA. Disposition Plan: ~ 2 days Consults called: None  Admission status: Obs tele   Author: Onnie Boer, MD 02/16/2023 7:20 PM  For on call review [email or web address].

## 2023-02-17 DIAGNOSIS — G9341 Metabolic encephalopathy: Secondary | ICD-10-CM | POA: Diagnosis not present

## 2023-02-17 LAB — BASIC METABOLIC PANEL
Anion gap: 9 (ref 5–15)
BUN: 36 mg/dL — ABNORMAL HIGH (ref 8–23)
CO2: 27 mmol/L (ref 22–32)
Calcium: 8.5 mg/dL — ABNORMAL LOW (ref 8.9–10.3)
Chloride: 101 mmol/L (ref 98–111)
Creatinine, Ser: 1.49 mg/dL — ABNORMAL HIGH (ref 0.44–1.00)
GFR, Estimated: 34 mL/min — ABNORMAL LOW (ref >60)
Glucose, Bld: 76 mg/dL (ref 70–99)
Potassium: 3.9 mmol/L (ref 3.5–5.1)
Sodium: 137 mmol/L (ref 135–145)

## 2023-02-17 LAB — CBC
HCT: 34.8 % — ABNORMAL LOW (ref 36.0–46.0)
Hemoglobin: 10.8 g/dL — ABNORMAL LOW (ref 12.0–15.0)
MCH: 27.8 pg (ref 26.0–34.0)
MCHC: 31 g/dL (ref 30.0–36.0)
MCV: 89.7 fL (ref 80.0–100.0)
Platelets: 226 10*3/uL (ref 150–400)
RBC: 3.88 MIL/uL (ref 3.87–5.11)
RDW: 14.1 % (ref 11.5–15.5)
WBC: 4.5 10*3/uL (ref 4.0–10.5)
nRBC: 0 % (ref 0.0–0.2)

## 2023-02-17 LAB — URINE CULTURE: Culture: >=100000 — AB

## 2023-02-17 MED ORDER — LEVOTHYROXINE SODIUM 75 MCG PO TABS
75.0000 ug | ORAL_TABLET | Freq: Every day | ORAL | Status: DC
  Administered 2023-02-17 – 2023-02-21 (×4): 75 ug via ORAL
  Filled 2023-02-17 (×5): qty 1

## 2023-02-17 NOTE — Progress Notes (Signed)
PROGRESS NOTE     Nicole Yoder, is a 87 y.o. female, DOB - 1936-05-04, ZOX:[id number]  Admit date - 02/16/2023   Admitting Physician Onnie Boer, MD  Outpatient Primary MD for the patient is [name], MD  LOS - 0  Chief Complaint  Patient presents with   Altered Mental Status      Brief Narrative:  87 y.o. female with medical history significant for neuropathy, anxiety, blood pressure, hypothyroidism admitted with acute metabolic encephalopathy due to presumed UTI from SNF facility    -Assessment and Plan: * Acute metabolic encephalopathy --CT no acute intracranial abnormality, suggests chronic fungal sinusitis -Chest x-ray nonacute- -UA suggestive of UTI   AKI (acute kidney injury) (HCC) -Creatinine on admission was 1.89 currently trending down to 1.49  continue hydration - renally adjust medications, avoid nephrotoxic agents / dehydration  / hypotension  Presumed UTI (urinary tract infection) -Continue Rocephin pending culture data  HTN (hypertension) -Continue atenolol  Hypothyroidism -Continue Synthroid  Chronic fungal sinusitis----outpatient follow-up with ID and ENT advised  Chronic anemia----Hgb stable above 10   Disposition: The patient is from: SNF              Anticipated d/c is to: SNF              Anticipated d/c date is: 2 days              Patient currently is not medically stable to d/c. Barriers: Not Clinically Stable-   Code Status :  -  Code Status: DNR   Family Communication: None at bedside  DVT Prophylaxis  :   - SCDs  heparin injection 5,000 Units Start: 02/16/23 2200   Lab Results  Component Value Date   PLT 226 02/17/2023    Inpatient Medications  Scheduled Meds:  heparin  5,000 Units Subcutaneous Q8H   levothyroxine  75 mcg Oral Q0600   Continuous Infusions:  sodium chloride 75 mL/hr at 02/17/23 0950   cefTRIAXone (ROCEPHIN)  IV 1 g (02/17/23 0952)   PRN Meds:.acetaminophen **OR** acetaminophen,  ondansetron **OR** ondansetron (ZOFRAN) IV, polyethylene glycol   Anti-infectives (From admission, onward)    Start     Dose/Rate Route Frequency Ordered Stop   02/17/23 1100  cefTRIAXone (ROCEPHIN) 1 g in sodium chloride 0.9 % 100 mL IVPB        1 g 200 mL/hr over 30 Minutes Intravenous Every 24 hours 02/16/23 1920     02/16/23 1115  cefTRIAXone (ROCEPHIN) 2 g in sodium chloride 0.9 % 100 mL IVPB        2 g 200 mL/hr over 30 Minutes Intravenous  Once 02/16/23 1106 02/16/23 1158         Subjective: Nicole Yoder today has no fevers, no emesis,  No chest pain,   - More awake,  -However patient is confused and disoriented   Objective: Vitals:   02/16/23 2156 02/17/23 0159 02/17/23 0558 02/17/23 1340  BP: 131/64 (!) 132/57 (!) 115/54 126/64  Pulse: (!) 56 (!) 57 (!) 57 (!) 59  Resp:    17  Temp: 97.8 F (36.6 C) 97.9 F (36.6 C) 98.3 F (36.8 C) 98.7 F (37.1 C)  TempSrc: Oral Oral Oral Oral  SpO2: 100% 97% 97% 98%  Weight:      Height:        Intake/Output Summary (Last 24 hours) at 02/17/2023 1827 Last data filed at 02/17/2023 1700 Gross per 24 hour  Intake 2169.97 ml  Output 1300 ml  Net 869.97 ml   Filed Weights   02/16/23 1045  Weight: 67.7 kg   Physical Exam  Gen:- Awake Alert, pleasantly confused HEENT:- [place].AT, No sclera icterus Neck-Supple Neck,No JVD,.  Lungs-  CTAB , fair symmetrical air movement CV- S1, S2 normal, regular  Abd-  +ve B.Sounds, Abd Soft, No tenderness,    Extremity/Skin:- No  edema, pedal pulses present  Psych-  cognitive, memory deficits and confusion episodes noted Neuro-generalized weakness, no additional new focal deficits, no tremors  Data Reviewed: I have personally reviewed following labs and imaging studies  CBC: Recent Labs  Lab 02/16/23 1117 02/17/23 0405  WBC 4.5 4.5  NEUTROABS 2.6  --   HGB 10.9* 10.8*  HCT 34.3* 34.8*  MCV 90.0 89.7  PLT 223 226   Basic Metabolic Panel: Recent Labs  Lab 02/16/23 1117  02/17/23 0405  NA 136 137  K 4.0 3.9  CL 95* 101  CO2 25 27  GLUCOSE 92 76  BUN 47* 36*  CREATININE 1.89* 1.49*  CALCIUM 8.6* 8.5*   GFR: Estimated Creatinine Clearance: 24.3 mL/min (A) (by C-G formula based on SCr of 1.49 mg/dL (H)). Liver Function Tests: Recent Labs  Lab 02/16/23 1117  AST 14*  ALT 13  ALKPHOS 77  BILITOT 0.8  PROT 6.2*  ALBUMIN 3.0*   Recent Results (from the past 240 hour(s))  Blood Culture (routine x 2)     Status: None (Preliminary result)   Collection Time: 02/16/23 11:17 AM   Specimen: BLOOD  Result Value Ref Range Status   Specimen Description BLOOD RIGHT ANTECUBITAL  Final   Special Requests   Final    BOTTLES DRAWN AEROBIC AND ANAEROBIC Blood Culture adequate volume   Culture   Final    NO GROWTH < 24 HOURS Performed at [facility], [address], [place], [place] [address]    Report Status PENDING  Incomplete  Blood Culture (routine x 2)     Status: None (Preliminary result)   Collection Time: 02/16/23 11:26 AM   Specimen: BLOOD  Result Value Ref Range Status   Specimen Description BLOOD LEFT ANTECUBITAL  Final   Special Requests   Final    BOTTLES DRAWN AEROBIC AND ANAEROBIC Blood Culture results may not be optimal due to an excessive volume of blood received in culture bottles   Culture   Final    NO GROWTH < 24 HOURS Performed at [facility], [address], [place], [place] [address]    Report Status PENDING  Incomplete  Resp panel by RT-PCR (RSV, Flu A&B, Covid) Anterior Nasal Swab     Status: None   Collection Time: 02/16/23 11:28 AM   Specimen: Anterior Nasal Swab  Result Value Ref Range Status   SARS Coronavirus 2 by RT PCR NEGATIVE NEGATIVE Final    Comment: (NOTE) SARS-CoV-2 target nucleic acids are NOT DETECTED.  The SARS-CoV-2 RNA is generally detectable in upper respiratory specimens during the acute phase of infection. The lowest concentration of SARS-CoV-2 viral copies this assay can detect is 138 copies/mL. A  negative result does not preclude SARS-Cov-2 infection and should not be used as the sole basis for treatment or other patient management decisions. A negative result may occur with  improper specimen collection/handling, submission of specimen other than nasopharyngeal swab, presence of viral mutation(s) within the areas targeted by this assay, and inadequate number of viral copies(<138 copies/mL). A negative result must be combined with clinical observations, patient history, and epidemiological information. The expected result  is Negative.  Fact Sheet for Patients:  BloggerCourse.com  Fact Sheet for Healthcare Providers:  SeriousBroker.it  This test is no t yet approved or cleared by the [place] FDA and  has been authorized for detection and/or diagnosis of SARS-CoV-2 by FDA under an Emergency Use Authorization (EUA). This EUA will remain  in effect (meaning this test can be used) for the duration of the COVID-19 declaration under Section 564(b)(1) of the Act, 21 U.S.C.section 360bbb-3(b)(1), unless the authorization is terminated  or revoked sooner.       Influenza A by PCR NEGATIVE NEGATIVE Final   Influenza B by PCR NEGATIVE NEGATIVE Final    Comment: (NOTE) The Xpert Xpress SARS-CoV-2/FLU/RSV plus assay is intended as an aid in the diagnosis of influenza from Nasopharyngeal swab specimens and should not be used as a sole basis for treatment. Nasal washings and aspirates are unacceptable for Xpert Xpress SARS-CoV-2/FLU/RSV testing.  Fact Sheet for Patients: BloggerCourse.com  Fact Sheet for Healthcare Providers: SeriousBroker.it  This test is not yet approved or cleared by the [place] FDA and has been authorized for detection and/or diagnosis of SARS-CoV-2 by FDA under an Emergency Use Authorization (EUA). This EUA will remain in effect (meaning this test can  be used) for the duration of the COVID-19 declaration under Section 564(b)(1) of the Act, 21 U.S.C. section 360bbb-3(b)(1), unless the authorization is terminated or revoked.     Resp Syncytial Virus by PCR NEGATIVE NEGATIVE Final    Comment: (NOTE) Fact Sheet for Patients: BloggerCourse.com  Fact Sheet for Healthcare Providers: SeriousBroker.it  This test is not yet approved or cleared by the [place] FDA and has been authorized for detection and/or diagnosis of SARS-CoV-2 by FDA under an Emergency Use Authorization (EUA). This EUA will remain in effect (meaning this test can be used) for the duration of the COVID-19 declaration under Section 564(b)(1) of the Act, 21 U.S.C. section 360bbb-3(b)(1), unless the authorization is terminated or revoked.  Performed at [facility], [address] [place], [place] [address]   Urine Culture     Status: Abnormal (Preliminary result)   Collection Time: 02/16/23 11:37 AM   Specimen: Urine, Random  Result Value Ref Range Status   Specimen Description   Final    URINE, RANDOM Performed at [facility], [address] [place], [place] [address]    Special Requests   Final    NONE Reflexed from [id number] Performed at [facility], [address] [place], [place] [address]    Culture >=100,000 COLONIES/mL ESCHERICHIA COLI (A)  Final   Report Status PENDING  Incomplete  MRSA Next Gen by PCR, Nasal     Status: None   Collection Time: 02/16/23  6:45 PM   Specimen: Nasal Mucosa; Nasal Swab  Result Value Ref Range Status   MRSA by PCR Next Gen NOT DETECTED NOT DETECTED Final    Comment: (NOTE) The GeneXpert MRSA Assay (FDA approved for NASAL specimens only), is one component of a comprehensive MRSA colonization surveillance program. It is not intended to diagnose MRSA infection nor to guide or monitor treatment for MRSA infections. Test performance is not FDA approved in patients less than  45 years old. Performed at [facility], [address] [place], [place] [address]       Radiology Studies: CT HEAD WO CONTRAST ( )  Result Date: 02/16/2023 CLINICAL DATA:  Altered mental status EXAM: CT HEAD WITHOUT CONTRAST TECHNIQUE: Contiguous axial images were obtained from the base of the skull through  the vertex without intravenous contrast. RADIATION DOSE REDUCTION: This exam was performed according to the departmental dose-optimization program which includes automated exposure control, adjustment of the mA and/or kV according to patient size and/or use of iterative reconstruction technique. COMPARISON:  Images of previous study done on 08/04/2021 are not available for comparison. Report for the previous study was revealed. FINDINGS: Brain: No acute intracranial findings are seen. Cortical sulci are prominent. There are no signs of bleeding within the cranium. There is decreased density in periventricular and subcortical white matter. Possible small old lacunar infarcts are seen in basal ganglia. There is 1.4 cm dense calcification inseparable from the falx suggesting possible meningioma. There is no adjacent edema or mass effect. Vascular: Scattered arterial calcifications are seen. Skull: No acute findings are seen. Sinuses/Orbits: There is opacification of left maxillary sinus. There are coarse calcifications in the left maxillary antrum. There is medial bulging of medial wall of the left maxillary sinus. There is mucosal thickening in ethmoid sinuses. Other: There is increased amount of CSF in the sella suggesting partial empty sella. IMPRESSION: No acute intracranial findings are seen. Atrophy. Small vessel disease. There is 1.4 cm dense calcification inseparable from the right side of the falx suggesting possible meningioma. There is no adjacent edema or mass effect. There is opacification in the left maxillary antrum. There are coarse calcifications in the left maxillary antrum. Findings  suggest possible chronic fungal sinusitis. Chronic ethmoid sinusitis. Electronically Signed   By: Ernie Avena M.D.   On: 02/16/2023 17:58   DG Chest Port 1 View  Result Date: 02/16/2023 CLINICAL DATA:  Four day history of altered mental status EXAM: PORTABLE CHEST 1 VIEW COMPARISON:  None Available. FINDINGS: Low lung volumes with bronchovascular crowding. No focal consolidations. No pleural effusion or pneumothorax. Enlarged cardiomediastinal silhouette is likely projectional. No acute osseous abnormality. IMPRESSION: Low lung volumes with bronchovascular crowding. No focal consolidations. Electronically Signed   By: Agustin Cree M.D.   On: 02/16/2023 11:34    Scheduled Meds:  heparin  5,000 Units Subcutaneous Q8H   levothyroxine  75 mcg Oral Q0600   Continuous Infusions:  sodium chloride 75 mL/hr at 02/17/23 0950   cefTRIAXone (ROCEPHIN)  IV 1 g (02/17/23 0952)    LOS: 0 days   Shon Hale M.D on 02/17/2023 at 6:27 PM  Go to [email or web address] - for contact info  [facility] - Office  [phone]  If 7PM-7AM, please contact night-coverage [email or web address] 02/17/2023, 6:27 PM

## 2023-02-17 NOTE — TOC Initial Note (Signed)
Transition of Care [facility]) - Initial/Assessment Note    Patient Details  Name: Nicole Yoder MRN: [id number] Date of Birth: 05-25-1936  Transition [facility] CM/SW Contact:    Elliot Gault, LCSW Phone Number: 02/17/2023, 10:16 AM  Clinical Narrative:                  Pt admitted from LTC at [facility]. Spoke with Jill Side at [facility] to update. Pt is able to return at dc. Pt is a family member of the assistant DON at the facility per [place].  MD anticipating dc in 1-2 days. TOC will follow.   Expected Discharge Plan: Long Term Nursing Home Barriers to Discharge: Continued Medical Work up   Patient Goals and CMS Choice Patient states their goals for this hospitalization and ongoing recovery are:: return to [facility]          Expected Discharge Plan and Services In-house Referral: Clinical Social Work   Post Acute Care Choice: Resumption of Svcs/PTA Provider Living arrangements for the past 2 months: Skilled Nursing Facility                                      Prior Living Arrangements/Services Living arrangements for the past 2 months: Skilled Nursing Facility Lives with:: Facility Resident Patient language and need for interpreter reviewed:: Yes Do you feel safe going back to the place where you live?: Yes      Need for Family Participation in Patient Care: No (Comment) Care giver support system in place?: Yes (comment)   Criminal Activity/Legal Involvement Pertinent to Current Situation/Hospitalization: No - Comment as needed  Activities of Daily Living Home Assistive Devices/Equipment: Wheelchair ADL Screening (condition at time of admission) Patient's cognitive ability adequate to safely complete daily activities?: No Is the patient deaf or have difficulty hearing?: No Does the patient have difficulty seeing, even when wearing glasses/contacts?: No Does the patient have difficulty concentrating, remembering, or making decisions?:  No Patient able to express need for assistance with ADLs?: Yes Does the patient have difficulty dressing or bathing?: Yes Independently performs ADLs?: No Communication: Independent Dressing (OT): Needs assistance Is this a change from baseline?: Pre-admission baseline Grooming: Needs assistance Is this a change from baseline?: Pre-admission baseline Feeding: Independent Bathing: Needs assistance Is this a change from baseline?: Pre-admission baseline Toileting: Needs assistance Is this a change from baseline?: Pre-admission baseline In/Out Bed: Needs assistance Is this a change from baseline?: Pre-admission baseline Walks in Home: Needs assistance Is this a change from baseline?: Pre-admission baseline Does the patient have difficulty walking or climbing stairs?: Yes Weakness of Legs: Both Weakness of Arms/Hands: Both  Permission Sought/Granted                  Emotional Assessment   Attitude/Demeanor/Rapport: Lethargic   Orientation: : Oriented to Self, Oriented to Situation, Oriented to Place Alcohol / Substance Use: Not Applicable Psych Involvement: No (comment)  Admission diagnosis:  Acute metabolic encephalopathy [G93.41] Patient Active Problem List   Diagnosis Date Noted   Acute metabolic encephalopathy 02/16/2023   UTI (urinary tract infection) 02/16/2023   AKI (acute kidney injury) (HCC) 02/16/2023   Hypothyroidism 02/16/2023   HTN (hypertension) 02/16/2023   Spinal stenosis of lumbar region 05/29/2020   Cervical stenosis of spine 05/29/2020   Other abnormalities of gait and mobility 02/27/2020   Neck pain 02/27/2020   Gait abnormality 10/31/2019  Chronic bilateral low back pain with sciatica 10/31/2019   Urinary incontinence 10/31/2019   Paresthesia 10/31/2019   Primary osteoarthritis of right shoulder 03/31/2017   Complete rotator cuff tear or rupture of right shoulder, not specified as traumatic 03/31/2017   Trigger middle finger of left hand  01/22/2016   Bilateral hand pain 01/22/2016   Dysphagia, pharyngoesophageal phase 07/30/2014   Spinal stenosis in cervical region 01/08/2014   Abnormality of gait 03/08/2013   Morbid obesity (HCC) 03/08/2013   Numbness 03/08/2013   PCP:  [name], MD Pharmacy:   [facility] - [place], [place] - [address] [place] [address] Phone: [phone] Fax: [phone]  [facility] - [place], [place] - [address] [facility] [address] [place] [address] [place] [address] Phone: [phone] Fax: [phone]     Social Determinants of Health (SDOH) Social History: SDOH Screenings   Food Insecurity: No Food Insecurity (02/16/2023)  Housing: Low Risk  (02/16/2023)  Transportation Needs: No Transportation Needs (02/16/2023)  Utilities: Not At Risk (02/16/2023)  Tobacco Use: Low Risk  (05/29/2020)   SDOH Interventions: Food Insecurity Interventions: Intervention Not Indicated Housing Interventions: Intervention Not Indicated Transportation Interventions: Intervention Not Indicated Utilities Interventions: Intervention Not Indicated   Readmission Risk Interventions     No data to display

## 2023-02-17 NOTE — NC FL2 (Signed)
[place] MEDICAID FL2 LEVEL OF CARE FORM     IDENTIFICATION  Patient Name: Nicole Yoder Birthdate: 1936/06/19 Sex: female Admission Date (Current Location): 02/16/2023  [facility] and [place] Number:  [employer] and Address:  [facility],  [address] [place] [address]      Provider Number: [id number]  Attending Physician Name and Address:  Shon Hale, MD  Relative Name and Phone Number:       Current Level of Care: Hospital Recommended Level of Care: Skilled Nursing Facility Prior Approval Number:    Date Approved/Denied:   PASRR Number:    Discharge Plan: SNF    Current Diagnoses: Patient Active Problem List   Diagnosis Date Noted   Acute metabolic encephalopathy 02/16/2023   UTI (urinary tract infection) 02/16/2023   AKI (acute kidney injury) (HCC) 02/16/2023   Hypothyroidism 02/16/2023   HTN (hypertension) 02/16/2023   Spinal stenosis of lumbar region 05/29/2020   Cervical stenosis of spine 05/29/2020   Other abnormalities of gait and mobility 02/27/2020   Neck pain 02/27/2020   Gait abnormality 10/31/2019   Chronic bilateral low back pain with sciatica 10/31/2019   Urinary incontinence 10/31/2019   Paresthesia 10/31/2019   Primary osteoarthritis of right shoulder 03/31/2017   Complete rotator cuff tear or rupture of right shoulder, not specified as traumatic 03/31/2017   Trigger middle finger of left hand 01/22/2016   Bilateral hand pain 01/22/2016   Dysphagia, pharyngoesophageal phase 07/30/2014   Spinal stenosis in cervical region 01/08/2014   Abnormality of gait 03/08/2013   Morbid obesity (HCC) 03/08/2013   Numbness 03/08/2013    Orientation RESPIRATION BLADDER Height & Weight     Self, Situation, Place  O2 (see dc summary) Incontinent Weight: 149 lb 4 oz (67.7 kg) Height:  5\' 1"  (154.9 cm)  BEHAVIORAL SYMPTOMS/MOOD NEUROLOGICAL BOWEL NUTRITION STATUS      Continent Diet (see dc summary)  AMBULATORY STATUS  COMMUNICATION OF NEEDS Skin   Extensive Assist Verbally Normal                       Personal Care Assistance Level of Assistance  Bathing, Feeding, Dressing Bathing Assistance: Limited assistance Feeding assistance: Independent Dressing Assistance: Limited assistance     Functional Limitations Info  Sight, Hearing, Speech Sight Info: Adequate Hearing Info: Adequate Speech Info: Adequate    SPECIAL CARE FACTORS FREQUENCY                       Contractures Contractures Info: Not present    Additional Factors Info  Code Status, Allergies Code Status Info: DNR Allergies Info: Prednisone, Celebrex (Celecoxib), Codeine, Lyrica (Pregabalin), Neurontin (Gabapentin), Penicillins, Statins, Sulfa Antibiotics, Tramadol, Ivp Dye (Iodinated Contrast Media)           Current Medications (02/17/2023):  This is the current hospital active medication list Current Facility-Administered Medications  Medication Dose Route Frequency Provider Last Rate Last Admin   0.9 %  sodium chloride infusion   Intravenous Continuous Emokpae, Ejiroghene E, MD 75 mL/hr at 02/17/23 0950 New Bag at 02/17/23 0950   acetaminophen (TYLENOL) tablet 650 mg  650 mg Oral Q6H PRN Emokpae, Ejiroghene E, MD       Or   acetaminophen (TYLENOL) suppository 650 mg  650 mg Rectal Q6H PRN Emokpae, Ejiroghene E, MD       cefTRIAXone (ROCEPHIN) 1 g in sodium chloride 0.9 % 100 mL IVPB  1 g Intravenous Q24H Emokpae,  Ejiroghene E, MD 200 mL/hr at 02/17/23 0952 1 g at 02/17/23 0952   heparin injection 5,000 Units  5,000 Units Subcutaneous Q8H Emokpae, Ejiroghene E, MD   5,000 Units at 02/17/23 0605   levothyroxine (SYNTHROID) tablet 75 mcg  75 mcg Oral Q0600 Shon Hale, MD   75 mcg at 02/17/23 0948   ondansetron (ZOFRAN) tablet 4 mg  4 mg Oral Q6H PRN Emokpae, Ejiroghene E, MD       Or   ondansetron (ZOFRAN) injection 4 mg  4 mg Intravenous Q6H PRN Emokpae, Ejiroghene E, MD       polyethylene glycol (MIRALAX /  GLYCOLAX) packet 17 g  17 g Oral Daily PRN Emokpae, Ejiroghene E, MD         Discharge Medications: Please see discharge summary for a list of discharge medications.  Relevant Imaging Results:  Relevant Lab Results:   Additional Information    Elliot Gault, LCSW

## 2023-02-18 DIAGNOSIS — H26493 Other secondary cataract, bilateral: Secondary | ICD-10-CM | POA: Diagnosis present

## 2023-02-18 DIAGNOSIS — B9620 Unspecified Escherichia coli [E. coli] as the cause of diseases classified elsewhere: Secondary | ICD-10-CM | POA: Diagnosis present

## 2023-02-18 DIAGNOSIS — N390 Urinary tract infection, site not specified: Secondary | ICD-10-CM | POA: Diagnosis present

## 2023-02-18 DIAGNOSIS — R1312 Dysphagia, oropharyngeal phase: Secondary | ICD-10-CM | POA: Diagnosis present

## 2023-02-18 DIAGNOSIS — F331 Major depressive disorder, recurrent, moderate: Secondary | ICD-10-CM | POA: Diagnosis present

## 2023-02-18 DIAGNOSIS — E7800 Pure hypercholesterolemia, unspecified: Secondary | ICD-10-CM | POA: Diagnosis present

## 2023-02-18 DIAGNOSIS — Z886 Allergy status to analgesic agent status: Secondary | ICD-10-CM | POA: Diagnosis not present

## 2023-02-18 DIAGNOSIS — Z880 Allergy status to penicillin: Secondary | ICD-10-CM | POA: Diagnosis not present

## 2023-02-18 DIAGNOSIS — E860 Dehydration: Secondary | ICD-10-CM | POA: Diagnosis present

## 2023-02-18 DIAGNOSIS — Z91041 Radiographic dye allergy status: Secondary | ICD-10-CM | POA: Diagnosis not present

## 2023-02-18 DIAGNOSIS — S43014D Anterior dislocation of right humerus, subsequent encounter: Secondary | ICD-10-CM | POA: Diagnosis not present

## 2023-02-18 DIAGNOSIS — H40051 Ocular hypertension, right eye: Secondary | ICD-10-CM | POA: Diagnosis present

## 2023-02-18 DIAGNOSIS — Z8249 Family history of ischemic heart disease and other diseases of the circulatory system: Secondary | ICD-10-CM | POA: Diagnosis not present

## 2023-02-18 DIAGNOSIS — E441 Mild protein-calorie malnutrition: Secondary | ICD-10-CM | POA: Diagnosis present

## 2023-02-18 DIAGNOSIS — I10 Essential (primary) hypertension: Secondary | ICD-10-CM | POA: Diagnosis present

## 2023-02-18 DIAGNOSIS — N3281 Overactive bladder: Secondary | ICD-10-CM | POA: Diagnosis present

## 2023-02-18 DIAGNOSIS — R112 Nausea with vomiting, unspecified: Secondary | ICD-10-CM | POA: Diagnosis present

## 2023-02-18 DIAGNOSIS — Z1152 Encounter for screening for COVID-19: Secondary | ICD-10-CM | POA: Diagnosis not present

## 2023-02-18 DIAGNOSIS — G629 Polyneuropathy, unspecified: Secondary | ICD-10-CM | POA: Diagnosis present

## 2023-02-18 DIAGNOSIS — Z888 Allergy status to other drugs, medicaments and biological substances status: Secondary | ICD-10-CM | POA: Diagnosis not present

## 2023-02-18 DIAGNOSIS — K30 Functional dyspepsia: Secondary | ICD-10-CM | POA: Diagnosis present

## 2023-02-18 DIAGNOSIS — M6281 Muscle weakness (generalized): Secondary | ICD-10-CM | POA: Diagnosis present

## 2023-02-18 DIAGNOSIS — R569 Unspecified convulsions: Secondary | ICD-10-CM | POA: Diagnosis present

## 2023-02-18 DIAGNOSIS — Z7401 Bed confinement status: Secondary | ICD-10-CM | POA: Diagnosis not present

## 2023-02-18 DIAGNOSIS — Z79899 Other long term (current) drug therapy: Secondary | ICD-10-CM | POA: Diagnosis not present

## 2023-02-18 DIAGNOSIS — S43014A Anterior dislocation of right humerus, initial encounter: Secondary | ICD-10-CM | POA: Diagnosis present

## 2023-02-18 DIAGNOSIS — Z66 Do not resuscitate: Secondary | ICD-10-CM | POA: Diagnosis present

## 2023-02-18 DIAGNOSIS — D649 Anemia, unspecified: Secondary | ICD-10-CM | POA: Diagnosis present

## 2023-02-18 DIAGNOSIS — R410 Disorientation, unspecified: Secondary | ICD-10-CM | POA: Diagnosis not present

## 2023-02-18 DIAGNOSIS — R2689 Other abnormalities of gait and mobility: Secondary | ICD-10-CM | POA: Diagnosis present

## 2023-02-18 DIAGNOSIS — Z91013 Allergy to seafood: Secondary | ICD-10-CM | POA: Diagnosis not present

## 2023-02-18 DIAGNOSIS — F419 Anxiety disorder, unspecified: Secondary | ICD-10-CM | POA: Diagnosis present

## 2023-02-18 DIAGNOSIS — E785 Hyperlipidemia, unspecified: Secondary | ICD-10-CM | POA: Diagnosis present

## 2023-02-18 DIAGNOSIS — D631 Anemia in chronic kidney disease: Secondary | ICD-10-CM | POA: Diagnosis present

## 2023-02-18 DIAGNOSIS — Z4789 Encounter for other orthopedic aftercare: Secondary | ICD-10-CM | POA: Diagnosis not present

## 2023-02-18 DIAGNOSIS — Z885 Allergy status to narcotic agent status: Secondary | ICD-10-CM | POA: Diagnosis not present

## 2023-02-18 DIAGNOSIS — S43084A Other dislocation of right shoulder joint, initial encounter: Secondary | ICD-10-CM | POA: Diagnosis not present

## 2023-02-18 DIAGNOSIS — F0390 Unspecified dementia without behavioral disturbance: Secondary | ICD-10-CM | POA: Diagnosis present

## 2023-02-18 DIAGNOSIS — M25521 Pain in right elbow: Secondary | ICD-10-CM | POA: Diagnosis not present

## 2023-02-18 DIAGNOSIS — Z882 Allergy status to sulfonamides status: Secondary | ICD-10-CM | POA: Diagnosis not present

## 2023-02-18 DIAGNOSIS — N179 Acute kidney failure, unspecified: Secondary | ICD-10-CM | POA: Diagnosis present

## 2023-02-18 DIAGNOSIS — G9341 Metabolic encephalopathy: Secondary | ICD-10-CM | POA: Diagnosis present

## 2023-02-18 DIAGNOSIS — Z79890 Hormone replacement therapy (postmenopausal): Secondary | ICD-10-CM | POA: Diagnosis not present

## 2023-02-18 DIAGNOSIS — R1319 Other dysphagia: Secondary | ICD-10-CM | POA: Diagnosis present

## 2023-02-18 DIAGNOSIS — E039 Hypothyroidism, unspecified: Secondary | ICD-10-CM | POA: Diagnosis present

## 2023-02-18 DIAGNOSIS — K219 Gastro-esophageal reflux disease without esophagitis: Secondary | ICD-10-CM | POA: Diagnosis present

## 2023-02-18 DIAGNOSIS — X58XXXA Exposure to other specified factors, initial encounter: Secondary | ICD-10-CM | POA: Diagnosis present

## 2023-02-18 DIAGNOSIS — S43004A Unspecified dislocation of right shoulder joint, initial encounter: Secondary | ICD-10-CM | POA: Diagnosis not present

## 2023-02-18 LAB — URINE CULTURE

## 2023-02-18 LAB — BASIC METABOLIC PANEL
Anion gap: 8 (ref 5–15)
BUN: 27 mg/dL — ABNORMAL HIGH (ref 8–23)
CO2: 27 mmol/L (ref 22–32)
Calcium: 8.3 mg/dL — ABNORMAL LOW (ref 8.9–10.3)
Chloride: 102 mmol/L (ref 98–111)
Creatinine, Ser: 1.3 mg/dL — ABNORMAL HIGH (ref 0.44–1.00)
GFR, Estimated: 40 mL/min — ABNORMAL LOW (ref >60)
Glucose, Bld: 87 mg/dL (ref 70–99)
Potassium: 3.9 mmol/L (ref 3.5–5.1)
Sodium: 137 mmol/L (ref 135–145)

## 2023-02-18 LAB — CULTURE, BLOOD (ROUTINE X 2): Culture: NO GROWTH

## 2023-02-18 MED ORDER — HYDROXYZINE HCL 10 MG PO TABS
10.0000 mg | ORAL_TABLET | Freq: Once | ORAL | Status: AC | PRN
  Administered 2023-02-18: 10 mg via ORAL
  Filled 2023-02-18: qty 1

## 2023-02-18 NOTE — Progress Notes (Signed)
PROGRESS NOTE     Nicole Yoder, is a 87 y.o. female, DOB - 03/12/1936, ZOX:[id number]  Admit date - 02/16/2023   Admitting Physician Bernadette Armijo Mariea Clonts, MD  Outpatient Primary MD for the patient is [name], MD  LOS - 0  Chief Complaint  Patient presents with   Altered Mental Status      Brief Narrative:  87 y.o. female with medical history significant for neuropathy, anxiety, blood pressure, hypothyroidism admitted with acute metabolic encephalopathy due to presumed UTI from SNF facility    -Assessment and Plan: * Acute metabolic encephalopathy --CT no acute intracranial abnormality, suggests chronic fungal sinusitis -Chest x-ray nonacute- 02/18/23 -E. coli UTI noted --As per family members at bedside patient's mentation is Not back to baseline -Intermittently confused and disoriented -Possible discharge to SNF on 02/19/2023 if mentation improves further and renal function improves     AKI (acute kidney injury) (HCC) -Creatinine on admission was 1.89 currently trending down to 1.3  continue hydration - renally adjust medications, avoid nephrotoxic agents / dehydration  / hypotension  E. coli UTI (urinary tract infection) -Continue Rocephin per urine sensitivity report  HTN (hypertension) -Continue atenolol  Hypothyroidism -Continue Synthroid  Chronic fungal sinusitis----outpatient follow-up with ID and ENT advised  Chronic anemia----Hgb stable above 10   Disposition: The patient is from: SNF              Anticipated d/c is to: SNF              Anticipated d/c date is: 2 days              Patient currently is not medically stable to d/c. Barriers: Not Clinically Stable-   Code Status :  -  Code Status: DNR   Family Communication: None at bedside  DVT Prophylaxis  :   - SCDs  heparin injection 5,000 Units Start: 02/16/23 2200   Lab Results  Component Value Date   PLT 226 02/17/2023    Inpatient Medications  Scheduled Meds:  heparin  5,000 Units  Subcutaneous Q8H   levothyroxine  75 mcg Oral Q0600   Continuous Infusions:  cefTRIAXone (ROCEPHIN)  IV 1 g (02/18/23 0940)   PRN Meds:.acetaminophen **OR** acetaminophen, ondansetron **OR** ondansetron (ZOFRAN) IV, polyethylene glycol   Anti-infectives (From admission, onward)    Start     Dose/Rate Route Frequency Ordered Stop   02/17/23 1100  cefTRIAXone (ROCEPHIN) 1 g in sodium chloride 0.9 % 100 mL IVPB        1 g 200 mL/hr over 30 Minutes Intravenous Every 24 hours 02/16/23 1920     02/16/23 1115  cefTRIAXone (ROCEPHIN) 2 g in sodium chloride 0.9 % 100 mL IVPB        2 g 200 mL/hr over 30 Minutes Intravenous  Once 02/16/23 1106 02/16/23 1158      Subjective: Nicole Yoder today has no fevers, no emesis,  No chest pain,   - -As per family members at bedside patient's mentation is Not back to baseline -Treatment confused and disoriented -Having difficulty chewing due to dental issues - -Possible discharge to SNF on 02/19/2023 if mentation improves further and renal function improves  Objective: Vitals:   02/17/23 1340 02/17/23 2049 02/18/23 0351 02/18/23 1216  BP: 126/64 (!) 130/59 (!) 143/71 124/67  Pulse: (!) 59 (!) 58 60 62  Resp: 17 18 16 20   Temp: 98.7 F (37.1 C) 99 F (37.2 C) 98.4 F (36.9 C) 98.7 F (37.1 C)  TempSrc: Oral Oral  SpO2: 98% 97% 97% 96%  Weight:      Height:        Intake/Output Summary (Last 24 hours) at 02/18/2023 1438 Last data filed at 02/18/2023 1429 Gross per 24 hour  Intake 1485.76 ml  Output 1800 ml  Net -314.24 ml   Filed Weights   02/16/23 1045  Weight: 67.7 kg   Physical Exam  Gen:- Awake Alert, pleasantly confused HEENT:- [place].AT, No sclera icterus Neck-Supple Neck,No JVD,.  Lungs-  CTAB , fair symmetrical air movement CV- S1, S2 normal, regular  Abd-  +ve B.Sounds, Abd Soft, No tenderness, no CVA area tenderness    Extremity/Skin:- No  edema, pedal pulses present  Psych-  cognitive, memory deficits and confusion  episodes noted Neuro-generalized weakness, no additional new focal deficits, no tremors  Data Reviewed: I have personally reviewed following labs and imaging studies  CBC: Recent Labs  Lab 02/16/23 1117 02/17/23 0405  WBC 4.5 4.5  NEUTROABS 2.6  --   HGB 10.9* 10.8*  HCT 34.3* 34.8*  MCV 90.0 89.7  PLT 223 226   Basic Metabolic Panel: Recent Labs  Lab 02/16/23 1117 02/17/23 0405 02/18/23 0439  NA 136 137 137  K 4.0 3.9 3.9  CL 95* 101 102  CO2 25 27 27   GLUCOSE 92 76 87  BUN 47* 36* 27*  CREATININE 1.89* 1.49* 1.30*  CALCIUM 8.6* 8.5* 8.3*   GFR: Estimated Creatinine Clearance: 27.9 mL/min (A) (by C-G formula based on SCr of 1.3 mg/dL (H)). Liver Function Tests: Recent Labs  Lab 02/16/23 1117  AST 14*  ALT 13  ALKPHOS 77  BILITOT 0.8  PROT 6.2*  ALBUMIN 3.0*   Recent Results (from the past 240 hour(s))  Blood Culture (routine x 2)     Status: None (Preliminary result)   Collection Time: 02/16/23 11:17 AM   Specimen: BLOOD  Result Value Ref Range Status   Specimen Description BLOOD RIGHT ANTECUBITAL  Final   Special Requests   Final    BOTTLES DRAWN AEROBIC AND ANAEROBIC Blood Culture adequate volume   Culture   Final    NO GROWTH 2 DAYS Performed at [facility], [address] [place], [place] [address]    Report Status PENDING  Incomplete  Blood Culture (routine x 2)     Status: None (Preliminary result)   Collection Time: 02/16/23 11:26 AM   Specimen: BLOOD  Result Value Ref Range Status   Specimen Description BLOOD LEFT ANTECUBITAL  Final   Special Requests   Final    BOTTLES DRAWN AEROBIC AND ANAEROBIC Blood Culture results may not be optimal due to an excessive volume of blood received in culture bottles   Culture   Final    NO GROWTH 2 DAYS Performed at [facility], [address], [place], [place] [address]    Report Status PENDING  Incomplete  Resp panel by RT-PCR (RSV, Flu A&B, Covid) Anterior Nasal Swab     Status: None   Collection  Time: 02/16/23 11:28 AM   Specimen: Anterior Nasal Swab  Result Value Ref Range Status   SARS Coronavirus 2 by RT PCR NEGATIVE NEGATIVE Final    Comment: (NOTE) SARS-CoV-2 target nucleic acids are NOT DETECTED.  The SARS-CoV-2 RNA is generally detectable in upper respiratory specimens during the acute phase of infection. The lowest concentration of SARS-CoV-2 viral copies this assay can detect is 138 copies/mL. A negative result does not preclude SARS-Cov-2 infection and should not be used as the sole basis  for treatment or other patient management decisions. A negative result may occur with  improper specimen collection/handling, submission of specimen other than nasopharyngeal swab, presence of viral mutation(s) within the areas targeted by this assay, and inadequate number of viral copies(<138 copies/mL). A negative result must be combined with clinical observations, patient history, and epidemiological information. The expected result is Negative.  Fact Sheet for Patients:  BloggerCourse.com  Fact Sheet for Healthcare Providers:  SeriousBroker.it  This test is no t yet approved or cleared by the [place] FDA and  has been authorized for detection and/or diagnosis of SARS-CoV-2 by FDA under an Emergency Use Authorization (EUA). This EUA will remain  in effect (meaning this test can be used) for the duration of the COVID-19 declaration under Section 564(b)(1) of the Act, 21 U.S.C.section 360bbb-3(b)(1), unless the authorization is terminated  or revoked sooner.       Influenza A by PCR NEGATIVE NEGATIVE Final   Influenza B by PCR NEGATIVE NEGATIVE Final    Comment: (NOTE) The Xpert Xpress SARS-CoV-2/FLU/RSV plus assay is intended as an aid in the diagnosis of influenza from Nasopharyngeal swab specimens and should not be used as a sole basis for treatment. Nasal washings and aspirates are unacceptable for Xpert Xpress  SARS-CoV-2/FLU/RSV testing.  Fact Sheet for Patients: BloggerCourse.com  Fact Sheet for Healthcare Providers: SeriousBroker.it  This test is not yet approved or cleared by the [place] FDA and has been authorized for detection and/or diagnosis of SARS-CoV-2 by FDA under an Emergency Use Authorization (EUA). This EUA will remain in effect (meaning this test can be used) for the duration of the COVID-19 declaration under Section 564(b)(1) of the Act, 21 U.S.C. section 360bbb-3(b)(1), unless the authorization is terminated or revoked.     Resp Syncytial Virus by PCR NEGATIVE NEGATIVE Final    Comment: (NOTE) Fact Sheet for Patients: BloggerCourse.com  Fact Sheet for Healthcare Providers: SeriousBroker.it  This test is not yet approved or cleared by the [place] FDA and has been authorized for detection and/or diagnosis of SARS-CoV-2 by FDA under an Emergency Use Authorization (EUA). This EUA will remain in effect (meaning this test can be used) for the duration of the COVID-19 declaration under Section 564(b)(1) of the Act, 21 U.S.C. section 360bbb-3(b)(1), unless the authorization is terminated or revoked.  Performed at [facility], [address], [place], [place] [address]   Urine Culture     Status: Abnormal   Collection Time: 02/16/23 11:37 AM   Specimen: Urine, Random  Result Value Ref Range Status   Specimen Description   Final    URINE, RANDOM Performed at [facility], [address], [place], [place] [address]    Special Requests   Final    NONE Reflexed from [id number] Performed at [facility], [address] [place], [place] [address]    Culture >=100,000 COLONIES/mL ESCHERICHIA COLI (A)  Final   Report Status 02/18/2023 FINAL  Final   Organism ID, Bacteria ESCHERICHIA COLI (A)  Final      Susceptibility   Escherichia coli - MIC*    AMPICILLIN 4  SENSITIVE Sensitive     CEFAZOLIN <=4 SENSITIVE Sensitive     CEFEPIME <=0.12 SENSITIVE Sensitive     CEFTRIAXONE <=0.25 SENSITIVE Sensitive     CIPROFLOXACIN >=4 RESISTANT Resistant     GENTAMICIN <=1 SENSITIVE Sensitive     IMIPENEM <=0.25 SENSITIVE Sensitive     NITROFURANTOIN <=16 SENSITIVE Sensitive     TRIMETH/SULFA <=20 SENSITIVE Sensitive  AMPICILLIN/SULBACTAM <=2 SENSITIVE Sensitive     PIP/TAZO <=4 SENSITIVE Sensitive     * >=100,000 COLONIES/mL ESCHERICHIA COLI  MRSA Next Gen by PCR, Nasal     Status: None   Collection Time: 02/16/23  6:45 PM   Specimen: Nasal Mucosa; Nasal Swab  Result Value Ref Range Status   MRSA by PCR Next Gen NOT DETECTED NOT DETECTED Final    Comment: (NOTE) The GeneXpert MRSA Assay (FDA approved for NASAL specimens only), is one component of a comprehensive MRSA colonization surveillance program. It is not intended to diagnose MRSA infection nor to guide or monitor treatment for MRSA infections. Test performance is not FDA approved in patients less than 37 years old. Performed at [facility], [address], [place], [place] [address]     Radiology Studies: CT HEAD WO CONTRAST ( )  Result Date: 02/16/2023 CLINICAL DATA:  Altered mental status EXAM: CT HEAD WITHOUT CONTRAST TECHNIQUE: Contiguous axial images were obtained from the base of the skull through the vertex without intravenous contrast. RADIATION DOSE REDUCTION: This exam was performed according to the departmental dose-optimization program which includes automated exposure control, adjustment of the mA and/or kV according to patient size and/or use of iterative reconstruction technique. COMPARISON:  Images of previous study done on 08/04/2021 are not available for comparison. Report for the previous study was revealed. FINDINGS: Brain: No acute intracranial findings are seen. Cortical sulci are prominent. There are no signs of bleeding within the cranium. There is decreased density in  periventricular and subcortical white matter. Possible small old lacunar infarcts are seen in basal ganglia. There is 1.4 cm dense calcification inseparable from the falx suggesting possible meningioma. There is no adjacent edema or mass effect. Vascular: Scattered arterial calcifications are seen. Skull: No acute findings are seen. Sinuses/Orbits: There is opacification of left maxillary sinus. There are coarse calcifications in the left maxillary antrum. There is medial bulging of medial wall of the left maxillary sinus. There is mucosal thickening in ethmoid sinuses. Other: There is increased amount of CSF in the sella suggesting partial empty sella. IMPRESSION: No acute intracranial findings are seen. Atrophy. Small vessel disease. There is 1.4 cm dense calcification inseparable from the right side of the falx suggesting possible meningioma. There is no adjacent edema or mass effect. There is opacification in the left maxillary antrum. There are coarse calcifications in the left maxillary antrum. Findings suggest possible chronic fungal sinusitis. Chronic ethmoid sinusitis. Electronically Signed   By: Ernie Avena M.D.   On: 02/16/2023 17:58    Scheduled Meds:  heparin  5,000 Units Subcutaneous Q8H   levothyroxine  75 mcg Oral Q0600   Continuous Infusions:  cefTRIAXone (ROCEPHIN)  IV 1 g (02/18/23 0940)    LOS: 0 days   Shon Hale M.D on 02/18/2023 at 2:38 PM  Go to [email or web address] - for contact info  [facility] - Office  [phone]  If 7PM-7AM, please contact night-coverage [email or web address] 02/18/2023, 2:38 PM

## 2023-02-19 ENCOUNTER — Inpatient Hospital Stay (HOSPITAL_COMMUNITY): Payer: Medicare Other

## 2023-02-19 DIAGNOSIS — S43014A Anterior dislocation of right humerus, initial encounter: Secondary | ICD-10-CM

## 2023-02-19 DIAGNOSIS — S43084A Other dislocation of right shoulder joint, initial encounter: Secondary | ICD-10-CM

## 2023-02-19 DIAGNOSIS — S43004A Unspecified dislocation of right shoulder joint, initial encounter: Secondary | ICD-10-CM | POA: Diagnosis present

## 2023-02-19 DIAGNOSIS — G9341 Metabolic encephalopathy: Secondary | ICD-10-CM | POA: Diagnosis not present

## 2023-02-19 MED ORDER — ATENOLOL 25 MG PO TABS
25.0000 mg | ORAL_TABLET | Freq: Two times a day (BID) | ORAL | Status: DC
  Administered 2023-02-19 – 2023-02-21 (×4): 25 mg via ORAL
  Filled 2023-02-19 (×5): qty 1

## 2023-02-19 MED ORDER — CLONAZEPAM 0.5 MG PO TABS
0.5000 mg | ORAL_TABLET | Freq: Two times a day (BID) | ORAL | Status: DC | PRN
  Administered 2023-02-21: 0.5 mg via ORAL
  Filled 2023-02-19: qty 1

## 2023-02-19 MED ORDER — HYDRALAZINE HCL 20 MG/ML IJ SOLN: 10.0000 mg | Freq: Four times a day (QID) | INTRAMUSCULAR | Status: DC | PRN

## 2023-02-19 MED ORDER — CITALOPRAM HYDROBROMIDE 10 MG PO TABS
10.0000 mg | ORAL_TABLET | Freq: Every day | ORAL | Status: DC
  Administered 2023-02-19 – 2023-02-21 (×3): 10 mg via ORAL
  Filled 2023-02-19 (×3): qty 1

## 2023-02-19 MED ORDER — LAMOTRIGINE 25 MG PO TABS
25.0000 mg | ORAL_TABLET | Freq: Two times a day (BID) | ORAL | Status: DC
  Administered 2023-02-19 – 2023-02-21 (×5): 25 mg via ORAL
  Filled 2023-02-19 (×5): qty 1

## 2023-02-19 NOTE — Progress Notes (Signed)
PROGRESS NOTE     Nicole Yoder, is a 87 y.o. female, DOB - March 06, 1936, ZOX:[id number]  Admit date - 02/16/2023   Admitting Physician Marquisha Nikolov Mariea Clonts, MD  Outpatient Primary MD for the patient is [name], MD  LOS - 1  Chief Complaint  Patient presents with   Altered Mental Status      Brief Narrative:  87 y.o. female with medical history significant for neuropathy, anxiety, blood pressure, hypothyroidism admitted with acute metabolic encephalopathy due to presumed UTI from SNF facility    -Assessment and Plan: 1) Acute metabolic encephalopathy due to E. coli UTI --CT no acute intracranial abnormality, suggests chronic fungal sinusitis -Chest x-ray nonacute- -mentation mostly back to baseline at this time  2) right shoulder dislocation--POA No injury recalled,  -According to patient's healthcare power of attorney Ms. [name]--patient has had right shoulder pain and discomfort for a while -On admission patient was a poor historian due to metabolic encephalopathy as above #1 -Additional history obtained from HCPOA---it appears patient had x-rays done at [facility] rehab facility with concerns about fracture dislocation at that time... However when patient got here she was too confused due to UTI to tell us much about the shoulder -The patient is Neurologically/mentation wise is much better Now , so she is able to relay concerns about right shoulder pain -overnight and this morning patient complained of right shoulder pain, on exam limited range of motion and discomfort with right upper extremity/shoulder range of motion -X-rays consistent with anterior glenohumeral dislocation -Discussed with orthopedic surgeon Dr. Dallas Schimke plans for or closed reduction under anesthesia on 02/21/2023  3)AKI (acute kidney injury) (HCC) -Creatinine on admission was 1.89 currently trending down to 1.3  continue hydration - renally adjust medications, avoid nephrotoxic agents / dehydration  /  hypotension  4)E. coli UTI (urinary tract infection) -Currently on IV Rocephin for pansensitive E. Coli  HTN (hypertension) -Continue atenolol  Hypothyroidism -Continue Synthroid  Chronic fungal sinusitis----outpatient follow-up with ID and ENT advised  Chronic anemia----Hgb stable above 10   Disposition: The patient is from: SNF              Anticipated d/c is to: SNF              Anticipated d/c date is: 2 days              Patient currently is not medically stable to d/c. Barriers: Not Clinically Stable-   Code Status :  -  Code Status: DNR   Family Communication: -Ms. [name]-HCPOA  DVT Prophylaxis  :   - SCDs  heparin injection 5,000 Units Start: 02/16/23 2200   Lab Results  Component Value Date   PLT 226 02/17/2023    Inpatient Medications  Scheduled Meds:  atenolol  25 mg Oral BID   citalopram  10 mg Oral Daily   heparin  5,000 Units Subcutaneous Q8H   lamoTRIgine  25 mg Oral BID   levothyroxine  75 mcg Oral Q0600   Continuous Infusions:  cefTRIAXone (ROCEPHIN)  IV 1 g (02/19/23 0958)   PRN Meds:.acetaminophen **OR** acetaminophen, clonazePAM, hydrALAZINE, ondansetron **OR** ondansetron (ZOFRAN) IV, polyethylene glycol   Anti-infectives (From admission, onward)    Start     Dose/Rate Route Frequency Ordered Stop   02/17/23 1100  cefTRIAXone (ROCEPHIN) 1 g in sodium chloride 0.9 % 100 mL IVPB        1 g 200 mL/hr over 30 Minutes Intravenous Every 24 hours 02/16/23 1920  02/16/23 1115  cefTRIAXone (ROCEPHIN) 2 g in sodium chloride 0.9 % 100 mL IVPB        2 g 200 mL/hr over 30 Minutes Intravenous  Once 02/16/23 1106 02/16/23 1158      Subjective: Nicole Yoder today has no fevers, no emesis,  No chest pain,   - -Mentation has improved =-Complains of right shoulder pain with attempts at range of motion  Objective: Vitals:   02/18/23 2008 02/19/23 0435 02/19/23 1307 02/19/23 1349  BP: (!) 152/69 (!) 153/76 (!) 150/67 (!)  152/68  Pulse: 68 62 63 65  Resp: 19 18  18   Temp: 98.7 F (37.1 C) 98.4 F (36.9 C)  98.2 F (36.8 C)  TempSrc:  Axillary  Oral  SpO2: 97% 94%  95%  Weight:      Height:        Intake/Output Summary (Last 24 hours) at 02/19/2023 1525 Last data filed at 02/19/2023 1315 Gross per 24 hour  Intake 480 ml  Output 2100 ml  Net -1620 ml   Filed Weights   02/16/23 1045  Weight: 67.7 kg   Physical Exam  Gen:- Awake Alert, more coherent, in no acute distress  HEENT:- [place].AT, No sclera icterus Neck-Supple Neck,No JVD,.  Lungs-  CTAB , fair symmetrical air movement CV- S1, S2 normal, regular  Abd-  +ve B.Sounds, Abd Soft, No tenderness, no CVA area tenderness    Extremity/Skin:- No  edema, pedal pulses present  Psych-appropriate affect, some memory and cognitive concerns persist,  neuro-generalized weakness, no additional new focal deficits, no tremors MSK-limited range of motion of right shoulder with discomfort with attempted range of motion, radial pulses is good  Data Reviewed: I have personally reviewed following labs and imaging studies  CBC: Recent Labs  Lab 02/16/23 1117 02/17/23 0405  WBC 4.5 4.5  NEUTROABS 2.6  --   HGB 10.9* 10.8*  HCT 34.3* 34.8*  MCV 90.0 89.7  PLT 223 226   Basic Metabolic Panel: Recent Labs  Lab 02/16/23 1117 02/17/23 0405 02/18/23 0439  NA 136 137 137  K 4.0 3.9 3.9  CL 95* 101 102  CO2 25 27 27   GLUCOSE 92 76 87  BUN 47* 36* 27*  CREATININE 1.89* 1.49* 1.30*  CALCIUM 8.6* 8.5* 8.3*   GFR: Estimated Creatinine Clearance: 27.9 mL/min (A) (by C-G formula based on SCr of 1.3 mg/dL (H)). Liver Function Tests: Recent Labs  Lab 02/16/23 1117  AST 14*  ALT 13  ALKPHOS 77  BILITOT 0.8  PROT 6.2*  ALBUMIN 3.0*   Recent Results (from the past 240 hour(s))  Blood Culture (routine x 2)     Status: None (Preliminary result)   Collection Time: 02/16/23 11:17 AM   Specimen: BLOOD  Result Value Ref Range Status   Specimen  Description BLOOD RIGHT ANTECUBITAL  Final   Special Requests   Final    BOTTLES DRAWN AEROBIC AND ANAEROBIC Blood Culture adequate volume   Culture   Final    NO GROWTH 3 DAYS Performed at [facility], [address] [place], [place] [address]    Report Status PENDING  Incomplete  Blood Culture (routine x 2)     Status: None (Preliminary result)   Collection Time: 02/16/23 11:26 AM   Specimen: BLOOD  Result Value Ref Range Status   Specimen Description BLOOD LEFT ANTECUBITAL  Final   Special Requests   Final    BOTTLES DRAWN AEROBIC AND ANAEROBIC Blood Culture results may not be optimal  due to an excessive volume of blood received in culture bottles   Culture   Final    NO GROWTH 3 DAYS Performed at [facility], [address] [place], [place] [address]    Report Status PENDING  Incomplete  Resp panel by RT-PCR (RSV, Flu A&B, Covid) Anterior Nasal Swab     Status: None   Collection Time: 02/16/23 11:28 AM   Specimen: Anterior Nasal Swab  Result Value Ref Range Status   SARS Coronavirus 2 by RT PCR NEGATIVE NEGATIVE Final    Comment: (NOTE) SARS-CoV-2 target nucleic acids are NOT DETECTED.  The SARS-CoV-2 RNA is generally detectable in upper respiratory specimens during the acute phase of infection. The lowest concentration of SARS-CoV-2 viral copies this assay can detect is 138 copies/mL. A negative result does not preclude SARS-Cov-2 infection and should not be used as the sole basis for treatment or other patient management decisions. A negative result may occur with  improper specimen collection/handling, submission of specimen other than nasopharyngeal swab, presence of viral mutation(s) within the areas targeted by this assay, and inadequate number of viral copies(<138 copies/mL). A negative result must be combined with clinical observations, patient history, and epidemiological information. The expected result is Negative.  Fact Sheet for Patients:   BloggerCourse.com  Fact Sheet for Healthcare Providers:  SeriousBroker.it  This test is no t yet approved or cleared by the [place] FDA and  has been authorized for detection and/or diagnosis of SARS-CoV-2 by FDA under an Emergency Use Authorization (EUA). This EUA will remain  in effect (meaning this test can be used) for the duration of the COVID-19 declaration under Section 564(b)(1) of the Act, 21 U.S.C.section 360bbb-3(b)(1), unless the authorization is terminated  or revoked sooner.       Influenza A by PCR NEGATIVE NEGATIVE Final   Influenza B by PCR NEGATIVE NEGATIVE Final    Comment: (NOTE) The Xpert Xpress SARS-CoV-2/FLU/RSV plus assay is intended as an aid in the diagnosis of influenza from Nasopharyngeal swab specimens and should not be used as a sole basis for treatment. Nasal washings and aspirates are unacceptable for Xpert Xpress SARS-CoV-2/FLU/RSV testing.  Fact Sheet for Patients: BloggerCourse.com  Fact Sheet for Healthcare Providers: SeriousBroker.it  This test is not yet approved or cleared by the [place] FDA and has been authorized for detection and/or diagnosis of SARS-CoV-2 by FDA under an Emergency Use Authorization (EUA). This EUA will remain in effect (meaning this test can be used) for the duration of the COVID-19 declaration under Section 564(b)(1) of the Act, 21 U.S.C. section 360bbb-3(b)(1), unless the authorization is terminated or revoked.     Resp Syncytial Virus by PCR NEGATIVE NEGATIVE Final    Comment: (NOTE) Fact Sheet for Patients: BloggerCourse.com  Fact Sheet for Healthcare Providers: SeriousBroker.it  This test is not yet approved or cleared by the [place] FDA and has been authorized for detection and/or diagnosis of SARS-CoV-2 by FDA under an Emergency Use  Authorization (EUA). This EUA will remain in effect (meaning this test can be used) for the duration of the COVID-19 declaration under Section 564(b)(1) of the Act, 21 U.S.C. section 360bbb-3(b)(1), unless the authorization is terminated or revoked.  Performed at [facility], [address], [place], [place] [address]   Urine Culture     Status: Abnormal   Collection Time: 02/16/23 11:37 AM   Specimen: Urine, Random  Result Value Ref Range Status   Specimen Description   Final    URINE, RANDOM  Performed at [facility], [address], [place], [place] [address]    Special Requests   Final    NONE Reflexed from [id number] Performed at [facility], [address] [place], [place] [address]    Culture >=100,000 COLONIES/mL ESCHERICHIA COLI (A)  Final   Report Status 02/18/2023 FINAL  Final   Organism ID, Bacteria ESCHERICHIA COLI (A)  Final      Susceptibility   Escherichia coli - MIC*    AMPICILLIN 4 SENSITIVE Sensitive     CEFAZOLIN <=4 SENSITIVE Sensitive     CEFEPIME <=0.12 SENSITIVE Sensitive     CEFTRIAXONE <=0.25 SENSITIVE Sensitive     CIPROFLOXACIN >=4 RESISTANT Resistant     GENTAMICIN <=1 SENSITIVE Sensitive     IMIPENEM <=0.25 SENSITIVE Sensitive     NITROFURANTOIN <=16 SENSITIVE Sensitive     TRIMETH/SULFA <=20 SENSITIVE Sensitive     AMPICILLIN/SULBACTAM <=2 SENSITIVE Sensitive     PIP/TAZO <=4 SENSITIVE Sensitive     * >=100,000 COLONIES/mL ESCHERICHIA COLI  MRSA Next Gen by PCR, Nasal     Status: None   Collection Time: 02/16/23  6:45 PM   Specimen: Nasal Mucosa; Nasal Swab  Result Value Ref Range Status   MRSA by PCR Next Gen NOT DETECTED NOT DETECTED Final    Comment: (NOTE) The GeneXpert MRSA Assay (FDA approved for NASAL specimens only), is one component of a comprehensive MRSA colonization surveillance program. It is not intended to diagnose MRSA infection nor to guide or monitor treatment for MRSA infections. Test performance is not FDA approved in  patients less than 73 years old. Performed at [facility], [address], [place], [place] [address]     Radiology Studies: DG Elbow 2 Views Right  Result Date: 02/19/2023 CLINICAL DATA:  144615 Pain 144615 EXAM: RIGHT ELBOW - 2 VIEW COMPARISON:  04/21/2017 FINDINGS: There is no evidence of fracture, dislocation, or joint effusion. The joint is not well profiled on the AP view however. There is no evidence of arthropathy or other focal bone abnormality. Soft tissues are unremarkable. IMPRESSION: Negative. Electronically Signed   By: Corlis Leak M.D.   On: 02/19/2023 10:10   DG Shoulder Right  Result Date: 02/19/2023 CLINICAL DATA:  144615 Pain 144615 EXAM: RIGHT SHOULDER - 2+ VIEW COMPARISON:  None available FINDINGS: Anterior glenohumeral dislocation. No definite fracture. Spurring from the humeral head. Acromioclavicular DJD. IMPRESSION: Anterior glenohumeral dislocation. Electronically Signed   By: Corlis Leak M.D.   On: 02/19/2023 10:09    Scheduled Meds:  atenolol  25 mg Oral BID   citalopram  10 mg Oral Daily   heparin  5,000 Units Subcutaneous Q8H   lamoTRIgine  25 mg Oral BID   levothyroxine  75 mcg Oral Q0600   Continuous Infusions:  cefTRIAXone (ROCEPHIN)  IV 1 g (02/19/23 0958)    LOS: 1 day   Shon Hale M.D on 02/19/2023 at 3:25 PM  Go to [email or web address] - for contact info  [facility] - Office  [phone]  If 7PM-7AM, please contact night-coverage [email or web address] 02/19/2023, 3:25 PM

## 2023-02-19 NOTE — Progress Notes (Signed)
Reported to MD Courage that patient started complaining of pain to right shoulder last night. New orders placed.

## 2023-02-19 NOTE — Consult Note (Signed)
ORTHOPAEDIC CONSULTATION  REQUESTING PHYSICIAN: Shon Hale, MD  ASSESSMENT AND PLAN: 87 y.o. female with the following: Right shoulder dislocation; unknown chronicity  Patient has been admitted for few days.  She was admitted following a UTI.  She exhibited confusion, and was unable to verbalize pain in her right shoulder.  However, this morning she mentioned that she had pain in the shoulder.  Subsequent x-rays demonstrated a right shoulder dislocation.  Chest x-ray from admission is not conclusive, but limited views of the right shoulder demonstrate the possibility of it being dislocated at the time of admission.  In discussing her care with her niece, who is her healthcare power of attorney, she has complained of right shoulder pain in the past.  It is possible, that the shoulder has been dislocated for at least the past week.  - Weight Bearing Status/Activity: Nonweightbearing right arm  - Additional recommended labs/tests: None  -VTE Prophylaxis: At the discretion of the primary team  - Pain control: As needed  - Follow-up plan: To be determined  -Procedures: Will plan to proceed to the operating room 02/20/2023 for attempted closed reduction of the right shoulder.  In discussing her care with her niece, I explicitly stated that no surgery will be performed, if we are unable to close reduce her shoulder.  In the event that the shoulder does not reduce in the operating room, and we do have to consider surgery, the procedure will be scheduled for later date.  All questions have been answered.  She will be consented for closed reduction of the right shoulder.  Risks and benefits have been discussed.  All questions have been answered.  Chief Complaint: Right shoulder pain  HPI: Nicole Yoder is a 87 y.o. female with past medical history as listed below who has been admitted following a UTI, and first complaint of right shoulder pain this morning.  Radiographs that demonstrate  a right shoulder dislocation.  Chest x-ray from the time of admission, although limited, does suggest the shoulder was dislocated at the time of admission.  The patient demonstrates confusion.  She was disoriented earlier in the week, which is part of the reason why she was admitted to the hospital.  She has subsequently been treated for UTI.  The patient is unsure how long her shoulder has been bothering her.  She cannot confirm if she has fallen, stumble or if there was another episode which caused her pain.  According to her niece, she has had pain in the right shoulder for several years.  She saw my partner 2018, and had glenohumeral arthritis on x-ray.  It is possible that the shoulder was dislocated a week ago, possibly more.  She has no numbness or tingling in the right upper extremity.  She has pain with attempted movement.  She has had some difficulty trying to feed herself due to the pain in her right arm.  Past Medical History:  Diagnosis Date   Anxiety    Gait abnormality    High blood pressure    High cholesterol    Neuropathy    Urinary frequency    Past Surgical History:  Procedure Laterality Date   broken ribs     broken wrist     CARPAL TUNNEL RELEASE Bilateral    EYE SURGERY     GANGLION CYST EXCISION     TONSILLECTOMY AND ADENOIDECTOMY     Social History   Socioeconomic History   Marital status: Single    Spouse name:  Not on file   Number of children: 0   Years of education: 12   Highest education level: Not on file  Occupational History   Occupation: Retired    Comment: retired  Tobacco Use   Smoking status: Never   Smokeless tobacco: Never  Substance and Sexual Activity   Alcohol use: No   Drug use: No   Sexual activity: Not on file  Other Topics Concern   Not on file  Social History Narrative   Patient is retired and lives at home with her friend. Patient has a high school education. Patient is from [place].      Right handed.   Caffeine- sometimes  tea.   Social Determinants of Health   Financial Resource Strain: Not on file  Food Insecurity: No Food Insecurity (02/16/2023)   Hunger Vital Sign    Worried About Running Out of Food in the Last Year: Never true    Ran Out of Food in the Last Year: Never true  Transportation Needs: No Transportation Needs (02/16/2023)   PRAPARE - Administrator, Civil Service (Medical): No    Lack of Transportation (Non-Medical): No  Physical Activity: Not on file  Stress: Not on file  Social Connections: Not on file   Family History  Problem Relation Age of Onset   Heart attack Mother    Lung cancer Father    Allergies  Allergen Reactions   Prednisone Other (See Comments)    High blood pressure    Ace Inhibitors Other (See Comments)    No reaction listed on MAR.   Celebrex [Celecoxib]    Codeine    Lyrica [Pregabalin] Other (See Comments)    No reaction listed on MAR.   Neurontin [Gabapentin]    Niacin And Related Other (See Comments)    No reaction listed on MAR   Penicillins Other (See Comments)    No reaction listed on MAR.   Shellfish Allergy Other (See Comments)    No reaction listed on MAR.   Shrimp (Diagnostic) Other (See Comments)    No reaction listed on MAR.   Statins Other (See Comments)    Muscle aches    Sulfa Antibiotics    Tramadol Other (See Comments)    hallucinations   Zoloft [Sertraline] Other (See Comments)    No reaction listed on MAR.   Ivp Dye [Iodinated Contrast Media] Rash   Prior to Admission medications   Medication Sig Start Date End Date Taking? Authorizing Provider  acetaminophen (TYLENOL) 325 MG tablet Take 650 mg by mouth every 8 (eight) hours as needed for mild pain.   Yes [provider]  amLODipine (NORVASC) 5 MG tablet Take 5 mg by mouth daily.   Yes [provider]  atenolol (TENORMIN) 50 MG tablet Take 50 mg by mouth daily.   Yes [provider]  citalopram (CELEXA) 10 MG tablet Take 10 mg by mouth  daily.   Yes [provider]  clonazePAM (KLONOPIN) 0.5 MG tablet Take 0.5 mg by mouth 2 (two) times daily.   Yes [provider]  Dextromethorphan-guaiFENesin (ROBITUSSIN PEAK COLD DM) 10-100 MG/5ML liquid Take 10 mLs by mouth every 4 (four) hours as needed (cough).   Yes [provider]  doxycycline (VIBRA-TABS) 100 MG tablet Take 100 mg by mouth 2 (two) times daily. 02/13/23  Yes [provider]  furosemide (LASIX) 20 MG tablet Take 20 mg by mouth every other day. 02/01/23  Yes [provider]  lamoTRIgine (LAMICTAL) 25 MG tablet Take 25 mg by mouth 2 (two) times daily.   Yes [provider]  levothyroxine (SYNTHROID) 88 MCG tablet Take 88 mcg by mouth daily before breakfast.   Yes [provider]  LOKELMA 5 g packet Take 5 g by mouth See admin instructions. Give 5mg  by mouth one time a day every Monday, Tuesday, Wednesday and Thursday.   Yes [provider]  meclizine (ANTIVERT) 12.5 MG tablet Take 25 mg by mouth every morning.   Yes [provider]  memantine (NAMENDA XR) 28 MG CP24 24 hr capsule Take 28 mg by mouth at bedtime.   Yes [provider]  Menthol, Topical Analgesic, (BIOFREEZE) 4 % GEL Apply 1 application  topically every 8 (eight) hours as needed (right shoulder pain).   Yes [provider]  ondansetron (ZOFRAN) 4 MG tablet Take 4 mg by mouth every morning.   Yes [provider]  OXYGEN Inhale 2 L/min into the lungs 3 (three) times daily. 2 LPM via nasal cannula per standing orders every shift for hypoxia related to URI   Yes [provider]  pantoprazole (PROTONIX) 20 MG tablet Take 20 mg by mouth every morning. 01/23/23  Yes [provider]  RHOPRESSA 0.02 % SOLN Place 1 drop into the right eye at bedtime. 02/10/23  Yes [provider]  sodium bicarbonate 650 MG tablet Take 650 mg by mouth 3 (three) times daily.   Yes [provider]   DG  Elbow 2 Views Right  Result Date: 02/19/2023 CLINICAL DATA:  144615 Pain 144615 EXAM: RIGHT ELBOW - 2 VIEW COMPARISON:  04/21/2017 FINDINGS: There is no evidence of fracture, dislocation, or joint effusion. The joint is not well profiled on the AP view however. There is no evidence of arthropathy or other focal bone abnormality. Soft tissues are unremarkable. IMPRESSION: Negative. Electronically Signed   By: Corlis Leak M.D.   On: 02/19/2023 10:10   DG Shoulder Right  Result Date: 02/19/2023 CLINICAL DATA:  144615 Pain 144615 EXAM: RIGHT SHOULDER - 2+ VIEW COMPARISON:  None available FINDINGS: Anterior glenohumeral dislocation. No definite fracture. Spurring from the humeral head. Acromioclavicular DJD. IMPRESSION: Anterior glenohumeral dislocation. Electronically Signed   By: Corlis Leak M.D.   On: 02/19/2023 10:09    Family History Reviewed and non-contributory, no pertinent history of problems with bleeding or anesthesia    Review of Systems No fevers or chills No numbness or tingling No chest pain No shortness of breath No bowel or bladder dysfunction No GI distress No headaches + confusion Limited appetite   OBJECTIVE  Vitals:Patient Vitals for the past 8 hrs:  BP Temp Temp src Pulse Resp SpO2  02/19/23 2030 (!) 154/73 98.3 F (36.8 C) -- 61 17 94 %  02/19/23 1349 (!) 152/68 98.2 F (36.8 C) Oral 65 18 95 %  02/19/23 1307 (!) 150/67 -- -- 63 -- --   General: Alert, no acute distress Cardiovascular: Warm extremities noted Respiratory: No cyanosis, no use of accessory musculature GI: No organomegaly, abdomen is soft and non-tender Skin: No lesions in the area of chief complaint other than those listed below in MSK exam.  Neurologic: Sensation intact distally save for the below mentioned MSK exam Psychiatric: Patient demonstrates confusion.  She is unaware of the date. Lymphatic: No swelling obvious and reported other than the area involved in the exam below Extremities  RUE:  Mild deformity of the right shoulder.  Tenderness to palpation.  She demonstrates  some muscle spasm.  She has intact sensation in the axillary nerve distribution.  Intact sensation throughout the right hand.  2+ radial pulse.  Warm and well-perfused     Test Results Imaging  3 views of the right shoulder demonstrates an anterior glenohumeral joint dislocation.  Labs cbc Recent Labs    02/17/23 0405  WBC 4.5  HGB 10.8*  HCT 34.8*  PLT 226     Recent Labs    02/17/23 0405 02/18/23 0439  NA 137 137  K 3.9 3.9  CL 101 102  CO2 27 27  GLUCOSE 76 87  BUN 36* 27*  CREATININE 1.49* 1.30*  CALCIUM 8.5* 8.3*

## 2023-02-19 NOTE — Progress Notes (Signed)
Patient alert with confusion, repositioned in bed during shift. Patient tolerated medications whole with sips of water.

## 2023-02-20 ENCOUNTER — Inpatient Hospital Stay (HOSPITAL_COMMUNITY): Payer: Medicare Other | Admitting: Anesthesiology

## 2023-02-20 ENCOUNTER — Inpatient Hospital Stay (HOSPITAL_COMMUNITY): Payer: Medicare Other

## 2023-02-20 ENCOUNTER — Encounter (HOSPITAL_COMMUNITY)
Admission: EM | Disposition: A | Discharge status: Skilled Nursing Facility | Payer: Self-pay | Source: Skilled Nursing Facility | Attending: Family Medicine

## 2023-02-20 DIAGNOSIS — G9341 Metabolic encephalopathy: Secondary | ICD-10-CM | POA: Diagnosis not present

## 2023-02-20 DIAGNOSIS — S43014A Anterior dislocation of right humerus, initial encounter: Secondary | ICD-10-CM | POA: Diagnosis not present

## 2023-02-20 DIAGNOSIS — S43084A Other dislocation of right shoulder joint, initial encounter: Secondary | ICD-10-CM | POA: Diagnosis not present

## 2023-02-20 HISTORY — PX: SHOULDER CLOSED REDUCTION: SHX1051

## 2023-02-20 LAB — BASIC METABOLIC PANEL
Anion gap: 9 (ref 5–15)
BUN: 19 mg/dL (ref 8–23)
CO2: 28 mmol/L (ref 22–32)
Calcium: 8.8 mg/dL — ABNORMAL LOW (ref 8.9–10.3)
Chloride: 99 mmol/L (ref 98–111)
Creatinine, Ser: 1.12 mg/dL — ABNORMAL HIGH (ref 0.44–1.00)
GFR, Estimated: 48 mL/min — ABNORMAL LOW (ref >60)
Glucose, Bld: 85 mg/dL (ref 70–99)
Potassium: 3.9 mmol/L (ref 3.5–5.1)
Sodium: 136 mmol/L (ref 135–145)

## 2023-02-20 LAB — CULTURE, BLOOD (ROUTINE X 2)

## 2023-02-20 LAB — CBC
HCT: 38.8 % (ref 36.0–46.0)
Hemoglobin: 12.5 g/dL (ref 12.0–15.0)
MCH: 28 pg (ref 26.0–34.0)
MCHC: 32.2 g/dL (ref 30.0–36.0)
MCV: 87 fL (ref 80.0–100.0)
Platelets: 274 10*3/uL (ref 150–400)
RBC: 4.46 MIL/uL (ref 3.87–5.11)
RDW: 13.8 % (ref 11.5–15.5)
WBC: 5.1 10*3/uL (ref 4.0–10.5)
nRBC: 0 % (ref 0.0–0.2)

## 2023-02-20 SURGERY — CLOSED REDUCTION, SHOULDER
Anesthesia: General | Site: Shoulder | Laterality: Right

## 2023-02-20 MED ORDER — ONDANSETRON HCL 4 MG/2ML IJ SOLN: 4.0000 mg | Freq: Once | INTRAMUSCULAR | Status: DC | PRN

## 2023-02-20 MED ORDER — FENTANYL CITRATE PF 50 MCG/ML IJ SOSY
25.0000 ug | PREFILLED_SYRINGE | INTRAMUSCULAR | Status: DC | PRN
  Administered 2023-02-20: 50 ug via INTRAVENOUS
  Filled 2023-02-20: qty 1

## 2023-02-20 MED ORDER — LACTATED RINGERS IV SOLN: INTRAVENOUS | Status: DC | PRN

## 2023-02-20 MED ORDER — PROPOFOL 10 MG/ML IV BOLUS
INTRAVENOUS | Status: DC | PRN
  Administered 2023-02-20: 120 mg via INTRAVENOUS

## 2023-02-20 MED ORDER — CHLORHEXIDINE GLUCONATE 0.12 % MT SOLN: 15.0000 mL | Freq: Once | OROMUCOSAL | Status: DC

## 2023-02-20 MED ORDER — LACTATED RINGERS IV SOLN: INTRAVENOUS | Status: DC

## 2023-02-20 MED ORDER — ORAL CARE MOUTH RINSE: 15.0000 mL | Freq: Once | OROMUCOSAL | Status: DC

## 2023-02-20 MED ORDER — DEXTROSE-SODIUM CHLORIDE 5-0.45 % IV SOLN: INTRAVENOUS | Status: AC

## 2023-02-20 MED ORDER — PROPOFOL 10 MG/ML IV BOLUS
INTRAVENOUS | Status: AC
  Filled 2023-02-20: qty 20

## 2023-02-20 MED ORDER — SUCCINYLCHOLINE CHLORIDE 200 MG/10ML IV SOSY
PREFILLED_SYRINGE | INTRAVENOUS | Status: AC
  Filled 2023-02-20: qty 10

## 2023-02-20 SURGICAL SUPPLY — 7 items
BNDG GAUZE ROLL STR 2.25X3YD (GAUZE/BANDAGES/DRESSINGS) IMPLANT
BNDG GZE SM 3X2.25 6 PLY (GAUZE/BANDAGES/DRESSINGS) ×2
CLOTH BEACON ORANGE TIMEOUT ST (SAFETY) ×1 IMPLANT
GAUZE XEROFORM 1X8 LF (GAUZE/BANDAGES/DRESSINGS) IMPLANT
KIT TURNOVER KIT A (KITS) IMPLANT
SLING ARM FOAM STRAP XLG (SOFTGOODS) ×1 IMPLANT
TAPE HYPAFIX 6X30 (GAUZE/BANDAGES/DRESSINGS) IMPLANT

## 2023-02-20 NOTE — Op Note (Signed)
Orthopaedic Surgery Operative Note (CSN: [id number])  Nicole Yoder  May 06, 1936 Date of Surgery: 02/16/2023 - 02/20/2023   Diagnoses:  Right shoulder dislocation  Procedure: Closed right shoulder reduction under anesthesia.     Operative Finding Successful completion of the planned procedure.  Glenohumeral joint was reduced once adequate anesthesia was achieved.  Shoulder was in better alignment.  Concern for recurrent instability.  Limited range of motion was attempted following reduction.  Skin tear on the dorsal aspect of the right hand.   Post-Op Diagnosis: Same Surgeons:Primary: Oliver Barre, MD Assistants: None Location: [facility] OR ROOM 4 Anesthesia: General Antibiotics:  N/A; no incision Tourniquet time: None Estimated Blood Loss: None Complications: None Specimens: None Implants: No implants  Indications for Surgery:   Nicole Yoder is a 87 y.o. female who was found to have a right shoulder dislocation.  No specific injury.  Patient was altered, and subsequently admitted for metabolic encephalopathy.  When she was more alert, she complained of right shoulder pain.  Full x-rays of the right shoulder demonstrated anterior glenohumeral joint dislocation.  Chronicity of the dislocation is unknown.  Patient is in a nursing home, and could have stumbled, falling or sustained a dislocation with changing positions in the bed.  She does have underlying arthritis.   Benefits and risks of operative and nonoperative management were discussed prior to surgery with the patient and her healthcare power of attorney and informed consent form was completed.  Specific risks including infection, need for additional surgery, persistent pain, recurrent dislocation, skin tears, damage to surrounding structures including nerves and/or blood vessels and more severe complications associate with anesthesia.  Patient's healthcare power of attorney elected proceed with manipulation under anesthesia.  All  questions were answered.   Procedure:   The patient was identified properly. Informed consent was obtained and the surgical site was marked. The patient was taken to the OR where general anesthesia was induced.  The patient was positioned supine.  Timeout was performed before the beginning of the case.  Once the patient was adequately anesthetized, we started by taking.  Reduction attempts x-rays.  These x-rays confirmed anterior glenohumeral joint dislocation.  We placed a sheet in the axilla, to provide countertraction.  I then provided some traction on the arm.  She did sustain a skin tear on the dorsal aspect of the right hand, which did start to bleed.  This was covered with a dressing.  I was able to palpate and manipulate the humeral head anterior to the glenohumeral joint.  With a combination of traction, countertraction and external rotation, as well as direct manipulation of the humeral head, we where able to reduce the glenohumeral joint.  She had improved range of motion.  There was improvement in the defect in the lateral shoulder area.  Due to concern for recurrent dislocation, limited range of motion was attempted.  She was placed in a sling.  Postreduction fluoroscopic images demonstrated reduction of the glenohumeral joint.  The skin tear, and the previous IV site were covered with a dressing.  She was placed in the sling.  She was awoken and taken to the PACU in stable condition.  Post-operative plan:  The patient will be NWB on the operative extremity, she is to remain in the sling She will be returned to the floor when she has recovered in the PACU  DVT prophylaxis per primary team, no orthopedic contraindications.    Pain control with PRN pain medication preferring oral medicines.   Follow  up plan will be scheduled in approximately 14 days for incision check and XR of the right shoulder.

## 2023-02-20 NOTE — Progress Notes (Signed)
PROGRESS NOTE     Nicole Yoder, is a 87 y.o. female, DOB - 1936-05-13, JYN:[id number]  Admit date - 02/16/2023   Admitting Physician Adasia Hoar Mariea Clonts, MD  Outpatient Primary MD for the patient is [name], MD  LOS - 2  Chief Complaint  Patient presents with   Altered Mental Status      Brief Narrative:  87 y.o. female with medical history significant for neuropathy, anxiety, blood pressure, hypothyroidism admitted with acute metabolic encephalopathy due to presumed UTI from SNF facility -Found to have anterior dislocation on the right shoulder -Plan is for closed reduction under sedation/anesthesia in OR on 02/20/2023   -Assessment and Plan: 1) Acute metabolic encephalopathy due to E. coli UTI --CT no acute intracranial abnormality, suggests chronic fungal sinusitis -Chest x-ray nonacute- -mentation mostly back to baseline at this time -At baseline patient has some cognitive and memory deficits as per HCPOA  2)Right shoulder dislocation--POA -On further review admission chest x-ray suggested the patient already had - dislocation of the right shoulder on admission--however due to altered mentation/encephalopathy on admission patient was Not able to give a good history at the time No injury recalled,  -According to patient's healthcare power of attorney Ms. Nicole Yoder--patient has had right shoulder pain and discomfort for a while -On admission patient was a poor historian due to metabolic encephalopathy as above #1 -Additional history obtained from HCPOA---it appears patient had x-rays done at [facility] rehab facility with concerns about fracture dislocation at that time... However when patient got here she was too confused due to UTI to tell us much about the shoulder -The patient is Neurologically/mentation wise is much better Now , so she is able to relay concerns about right shoulder pain -overnight and this morning patient complained of right shoulder pain, on exam  limited range of motion and discomfort with right upper extremity/shoulder range of motion -X-rays consistent with anterior glenohumeral dislocation -Discussed with orthopedic surgeon Dr. Dallas Schimke plans for or closed reduction under anesthesia on 02/20/2023  3)AKI (acute kidney injury) (HCC) -Creatinine on admission was 1.89 currently trending down to 1.3  continue hydration - renally adjust medications, avoid nephrotoxic agents / dehydration  / hypotension  4)E. coli UTI (urinary tract infection) -Okay to complete IV Rocephin for pansensitive E. Coli--last dose 02/20/2023  HTN (hypertension) -Continue atenolol -IV hydralazine as needed elevated BP  Hypothyroidism -Continue Synthroid  Chronic fungal sinusitis----outpatient follow-up with ID and ENT advised  Chronic anemia----Hgb stable above 10, Hgb may have dropped due to hydration/IV fluids   Disposition: The patient is from: SNF              Anticipated d/c is to: SNF--awaiting right shoulder closed reduction              Anticipated d/c date is: 1 day              Patient currently is not medically stable to d/c. Barriers: Not Clinically Stable-   Code Status :  -  Code Status: DNR   Family Communication: -Ms. Nicole Yoder-HCPOA  DVT Prophylaxis  :   - SCDs  heparin injection 5,000 Units Start: 02/16/23 2200   Lab Results  Component Value Date   PLT 226 02/17/2023    Inpatient Medications  Scheduled Meds:  atenolol  25 mg Oral BID   citalopram  10 mg Oral Daily   heparin  5,000 Units Subcutaneous Q8H   lamoTRIgine  25 mg Oral BID   levothyroxine  75 mcg Oral Q0600  Continuous Infusions:  cefTRIAXone (ROCEPHIN)  IV 1 g (02/19/23 0958)   dextrose 5 % and 0.45 % NaCl     PRN Meds:.acetaminophen **OR** acetaminophen, clonazePAM, hydrALAZINE, ondansetron **OR** ondansetron (ZOFRAN) IV, polyethylene glycol   Anti-infectives (From admission, onward)    Start     Dose/Rate Route Frequency Ordered Stop    02/17/23 1100  cefTRIAXone (ROCEPHIN) 1 g in sodium chloride 0.9 % 100 mL IVPB        1 g 200 mL/hr over 30 Minutes Intravenous Every 24 hours 02/16/23 1920 02/21/23 1059   02/16/23 1115  cefTRIAXone (ROCEPHIN) 2 g in sodium chloride 0.9 % 100 mL IVPB        2 g 200 mL/hr over 30 Minutes Intravenous  Once 02/16/23 1106 02/16/23 1158      Subjective: [name] today has no fevers, no emesis,  No chest pain,   - -Mentation has improved =-Complains of right shoulder pain with attempts at range of motion -Possible discharge back to SNF on 02/21/2023 after closed reduction of right Dislocation on 02/20/2023  Objective: Vitals:   02/19/23 1349 02/19/23 2030 02/20/23 0438 02/20/23 0804  BP: (!) 152/68 (!) 154/73 (!) 154/65 (!) 161/85  Pulse: 65 61 62 60  Resp: 18 17 18    Temp: 98.2 F (36.8 C) 98.3 F (36.8 C) 98.6 F (37 C)   TempSrc: Oral     SpO2: 95% 94% 92%   Weight:      Height:        Intake/Output Summary (Last 24 hours) at 02/20/2023 0834 Last data filed at 02/20/2023 0500 Gross per 24 hour  Intake 480 ml  Output 1300 ml  Net -820 ml   Filed Weights   02/16/23 1045  Weight: 67.7 kg   Physical Exam  Gen:- Awake Alert, more coherent, in no acute distress  HEENT:- [place].AT, No sclera icterus Neck-Supple Neck,No JVD,.  Lungs-  CTAB , fair symmetrical air movement CV- S1, S2 normal, regular  Abd-  +ve B.Sounds, Abd Soft, No tenderness, no CVA area tenderness    Extremity/Skin:- No  edema, pedal pulses present  Psych-appropriate affect, some memory and cognitive concerns persist,  neuro-generalized weakness, no additional new focal deficits, no tremors MSK-limited range of motion of right shoulder with discomfort with attempted range of motion, radial pulses is good  Data Reviewed: I have personally reviewed following labs and imaging studies  CBC: Recent Labs  Lab 02/16/23 1117 02/17/23 0405  WBC 4.5 4.5  NEUTROABS 2.6  --   HGB 10.9* 10.8*  HCT 34.3* 34.8*   MCV 90.0 89.7  PLT 223 226   Basic Metabolic Panel: Recent Labs  Lab 02/16/23 1117 02/17/23 0405 02/18/23 0439  NA 136 137 137  K 4.0 3.9 3.9  CL 95* 101 102  CO2 25 27 27   GLUCOSE 92 76 87  BUN 47* 36* 27*  CREATININE 1.89* 1.49* 1.30*  CALCIUM 8.6* 8.5* 8.3*   GFR: Estimated Creatinine Clearance: 27.9 mL/min (A) (by C-G formula based on SCr of 1.3 mg/dL (H)). Liver Function Tests: Recent Labs  Lab 02/16/23 1117  AST 14*  ALT 13  ALKPHOS 77  BILITOT 0.8  PROT 6.2*  ALBUMIN 3.0*   Recent Results (from the past 240 hour(s))  Blood Culture (routine x 2)     Status: None (Preliminary result)   Collection Time: 02/16/23 11:17 AM   Specimen: BLOOD  Result Value Ref Range Status   Specimen Description BLOOD RIGHT ANTECUBITAL  Final  Special Requests   Final    BOTTLES DRAWN AEROBIC AND ANAEROBIC Blood Culture adequate volume   Culture   Final    NO GROWTH 3 DAYS Performed at [facility], [address], [place], [place] [address]    Report Status PENDING  Incomplete  Blood Culture (routine x 2)     Status: None (Preliminary result)   Collection Time: 02/16/23 11:26 AM   Specimen: BLOOD  Result Value Ref Range Status   Specimen Description BLOOD LEFT ANTECUBITAL  Final   Special Requests   Final    BOTTLES DRAWN AEROBIC AND ANAEROBIC Blood Culture results may not be optimal due to an excessive volume of blood received in culture bottles   Culture   Final    NO GROWTH 3 DAYS Performed at [facility], [address], [place], [place] [address]    Report Status PENDING  Incomplete  Resp panel by RT-PCR (RSV, Flu A&B, Covid) Anterior Nasal Swab     Status: None   Collection Time: 02/16/23 11:28 AM   Specimen: Anterior Nasal Swab  Result Value Ref Range Status   SARS Coronavirus 2 by RT PCR NEGATIVE NEGATIVE Final    Comment: (NOTE) SARS-CoV-2 target nucleic acids are NOT DETECTED.  The SARS-CoV-2 RNA is generally detectable in upper respiratory specimens  during the acute phase of infection. The lowest concentration of SARS-CoV-2 viral copies this assay can detect is 138 copies/mL. A negative result does not preclude SARS-Cov-2 infection and should not be used as the sole basis for treatment or other patient management decisions. A negative result may occur with  improper specimen collection/handling, submission of specimen other than nasopharyngeal swab, presence of viral mutation(s) within the areas targeted by this assay, and inadequate number of viral copies(<138 copies/mL). A negative result must be combined with clinical observations, patient history, and epidemiological information. The expected result is Negative.  Fact Sheet for Patients:  BloggerCourse.com  Fact Sheet for Healthcare Providers:  SeriousBroker.it  This test is no t yet approved or cleared by the [place] FDA and  has been authorized for detection and/or diagnosis of SARS-CoV-2 by FDA under an Emergency Use Authorization (EUA). This EUA will remain  in effect (meaning this test can be used) for the duration of the COVID-19 declaration under Section 564(b)(1) of the Act, 21 U.S.C.section 360bbb-3(b)(1), unless the authorization is terminated  or revoked sooner.       Influenza A by PCR NEGATIVE NEGATIVE Final   Influenza B by PCR NEGATIVE NEGATIVE Final    Comment: (NOTE) The Xpert Xpress SARS-CoV-2/FLU/RSV plus assay is intended as an aid in the diagnosis of influenza from Nasopharyngeal swab specimens and should not be used as a sole basis for treatment. Nasal washings and aspirates are unacceptable for Xpert Xpress SARS-CoV-2/FLU/RSV testing.  Fact Sheet for Patients: BloggerCourse.com  Fact Sheet for Healthcare Providers: SeriousBroker.it  This test is not yet approved or cleared by the [place] FDA and has been authorized for detection  and/or diagnosis of SARS-CoV-2 by FDA under an Emergency Use Authorization (EUA). This EUA will remain in effect (meaning this test can be used) for the duration of the COVID-19 declaration under Section 564(b)(1) of the Act, 21 U.S.C. section 360bbb-3(b)(1), unless the authorization is terminated or revoked.     Resp Syncytial Virus by PCR NEGATIVE NEGATIVE Final    Comment: (NOTE) Fact Sheet for Patients: BloggerCourse.com  Fact Sheet for Healthcare Providers: SeriousBroker.it  This test is not yet approved or cleared  by the [place] and has been authorized for detection and/or diagnosis of SARS-CoV-2 by FDA under an Emergency Use Authorization (EUA). This EUA will remain in effect (meaning this test can be used) for the duration of the COVID-19 declaration under Section 564(b)(1) of the Act, 21 U.S.C. section 360bbb-3(b)(1), unless the authorization is terminated or revoked.  Performed at [facility], [address] [place], [place] [address]   Urine Culture     Status: Abnormal   Collection Time: 02/16/23 11:37 AM   Specimen: Urine, Random  Result Value Ref Range Status   Specimen Description   Final    URINE, RANDOM Performed at [facility], [address], [place], [place] [address]    Special Requests   Final    NONE Reflexed from [id number] Performed at [facility], [address] [place], [place] [address]    Culture >=100,000 COLONIES/mL ESCHERICHIA COLI (A)  Final   Report Status 02/18/2023 FINAL  Final   Organism ID, Bacteria ESCHERICHIA COLI (A)  Final      Susceptibility   Escherichia coli - MIC*    AMPICILLIN 4 SENSITIVE Sensitive     CEFAZOLIN <=4 SENSITIVE Sensitive     CEFEPIME <=0.12 SENSITIVE Sensitive     CEFTRIAXONE <=0.25 SENSITIVE Sensitive     CIPROFLOXACIN >=4 RESISTANT Resistant     GENTAMICIN <=1 SENSITIVE Sensitive     IMIPENEM <=0.25 SENSITIVE Sensitive     NITROFURANTOIN <=16  SENSITIVE Sensitive     TRIMETH/SULFA <=20 SENSITIVE Sensitive     AMPICILLIN/SULBACTAM <=2 SENSITIVE Sensitive     PIP/TAZO <=4 SENSITIVE Sensitive     * >=100,000 COLONIES/mL ESCHERICHIA COLI  MRSA Next Gen by PCR, Nasal     Status: None   Collection Time: 02/16/23  6:45 PM   Specimen: Nasal Mucosa; Nasal Swab  Result Value Ref Range Status   MRSA by PCR Next Gen NOT DETECTED NOT DETECTED Final    Comment: (NOTE) The GeneXpert MRSA Assay (FDA approved for NASAL specimens only), is one component of a comprehensive MRSA colonization surveillance program. It is not intended to diagnose MRSA infection nor to guide or monitor treatment for MRSA infections. Test performance is not FDA approved in patients less than 51 years old. Performed at [facility], [address] [place], [place] [address]     Radiology Studies: DG Elbow 2 Views Right  Result Date: 02/19/2023 CLINICAL DATA:  144615 Pain 144615 EXAM: RIGHT ELBOW - 2 VIEW COMPARISON:  04/21/2017 FINDINGS: There is no evidence of fracture, dislocation, or joint effusion. The joint is not well profiled on the AP view however. There is no evidence of arthropathy or other focal bone abnormality. Soft tissues are unremarkable. IMPRESSION: Negative. Electronically Signed   By: Corlis Leak M.D.   On: 02/19/2023 10:10   DG Shoulder Right  Result Date: 02/19/2023 CLINICAL DATA:  144615 Pain 144615 EXAM: RIGHT SHOULDER - 2+ VIEW COMPARISON:  None available FINDINGS: Anterior glenohumeral dislocation. No definite fracture. Spurring from the humeral head. Acromioclavicular DJD. IMPRESSION: Anterior glenohumeral dislocation. Electronically Signed   By: Corlis Leak M.D.   On: 02/19/2023 10:09    Scheduled Meds:  atenolol  25 mg Oral BID   citalopram  10 mg Oral Daily   heparin  5,000 Units Subcutaneous Q8H   lamoTRIgine  25 mg Oral BID   levothyroxine  75 mcg Oral Q0600   Continuous Infusions:  cefTRIAXone (ROCEPHIN)  IV 1 g (02/19/23 0958)    dextrose 5 %  and 0.45 % NaCl      LOS: 2 days   Shon Hale M.D on 02/20/2023 at 8:34 AM  Go to [email or web address] - for contact info  [facility] - Office  [phone]  If 7PM-7AM, please contact night-coverage [email or web address] 02/20/2023, 8:34 AM

## 2023-02-20 NOTE — Anesthesia Preprocedure Evaluation (Signed)
Anesthesia Evaluation  Patient identified by MRN, date of birth, ID band Patient awake    Reviewed: Allergy & Precautions, H&P , NPO status , Patient's Chart, lab work & pertinent test results, reviewed documented beta blocker date and time   Airway Mallampati: II  TM Distance: >3 FB Neck ROM: full    Dental no notable dental hx.    Pulmonary neg pulmonary ROS   Pulmonary exam normal breath sounds clear to auscultation       Cardiovascular Exercise Tolerance: Good hypertension, negative cardio ROS  Rhythm:regular Rate:Normal     Neuro/Psych   Anxiety      Neuromuscular disease negative neurological ROS  negative psych ROS   GI/Hepatic negative GI ROS, Neg liver ROS,,,  Endo/Other  negative endocrine ROSHypothyroidism    Renal/GU Renal diseasenegative Renal ROS  negative genitourinary   Musculoskeletal   Abdominal   Peds  Hematology negative hematology ROS (+)   Anesthesia Other Findings   Reproductive/Obstetrics negative OB ROS                             Anesthesia Physical Anesthesia Plan  ASA: 3 and emergent  Anesthesia Plan: General   Post-op Pain Management:    Induction:   PONV Risk Score and Plan: Propofol infusion  Airway Management Planned:   Additional Equipment:   Intra-op Plan:   Post-operative Plan:   Informed Consent: I have reviewed the patients History and Physical, chart, labs and discussed the procedure including the risks, benefits and alternatives for the proposed anesthesia with the patient or authorized representative who has indicated his/her understanding and acceptance.     Dental Advisory Given  Plan Discussed with: CRNA  Anesthesia Plan Comments:        Anesthesia Quick Evaluation

## 2023-02-20 NOTE — Progress Notes (Signed)
Pt this morning was A&O x4 she is currently confused only oriented to self states she "feels strange and like she is going to die" states "Im scared repeatedly" reassured patient where she was and that she is ok. Vitals stable. Charge informed. BP 161/60 HR 59 O2 94 RA R 16 Pt denies any pain present at this time.

## 2023-02-20 NOTE — Plan of Care (Signed)

## 2023-02-20 NOTE — Transfer of Care (Signed)
Immediate Anesthesia Transfer of Care Note  Patient: Nicole Yoder  Procedure(s) Performed: CLOSED REDUCTION SHOULDER (Right: Shoulder)  Patient Location: PACU  Anesthesia Type:General  Level of Consciousness: awake and alert   Airway & Oxygen Therapy: Patient Spontanous Breathing and Patient connected to nasal cannula oxygen  Post-op Assessment: Report given to RN and Post -op Vital signs reviewed and stable  Post vital signs: Reviewed and stable  Last Vitals:  Vitals Value Taken Time  BP    Temp    Pulse    Resp    SpO2      Last Pain:  Vitals:   02/19/23 2249  TempSrc:   PainSc: Asleep         Complications: No notable events documented.

## 2023-02-20 NOTE — Progress Notes (Addendum)
Pre Procedure note for inpatients:   Nicole Yoder has been scheduled for Closed reduction of right shoulder dislocation today. The various methods of treatment have been discussed with the patient. After consideration of the risks, benefits and treatment options the patient has consented to the planned procedure.   The patient has been seen and labs reviewed. There are no changes in the patient's condition to prevent proceeding with the planned procedure today.  We will attempt a closed reduction of the right shoulder.  Her shoulder has been dislocated for several days, which will make the reduction attempt more difficult.  There is an increased chance that the shoulder cannot be closed reduced.  If surgery is required in order to reduce the shoulder, this will be scheduled for another day.  We will not be making an incision today, if needed, in order to reduce the shoulder.   Recent labs:  Lab Results  Component Value Date   WBC 5.1 02/20/2023   HGB 12.5 02/20/2023   HCT 38.8 02/20/2023   PLT 274 02/20/2023   GLUCOSE 85 02/20/2023   ALT 13 02/16/2023   AST 14 (L) 02/16/2023   NA 136 02/20/2023   K 3.9 02/20/2023   CL 99 02/20/2023   CREATININE 1.12 (H) 02/20/2023   BUN 19 02/20/2023   CO2 28 02/20/2023   TSH 3.190 10/31/2019   INR 1.1 02/16/2023   HGBA1C 5.3 10/31/2019    Oliver Barre, MD 02/20/2023 8:59 AM

## 2023-02-20 NOTE — Anesthesia Postprocedure Evaluation (Signed)
Anesthesia Post Note  Patient: Nicole Yoder  Procedure(s) Performed: CLOSED REDUCTION SHOULDER (Right: Shoulder)  Patient location during evaluation: PACU Anesthesia Type: General Level of consciousness: awake and alert Pain management: pain level controlled Vital Signs Assessment: post-procedure vital signs reviewed and stable Respiratory status: spontaneous breathing, nonlabored ventilation, respiratory function stable and patient connected to nasal cannula oxygen Cardiovascular status: blood pressure returned to baseline and stable Postop Assessment: no apparent nausea or vomiting Anesthetic complications: no   No notable events documented.   Last Vitals:  Vitals:   02/20/23 0953 02/20/23 1000  BP: (!) 145/62 (!) 149/89  Pulse: 64 64  Resp: 13 12  Temp: 36.4 C   SpO2: 95% 96%    Last Pain:  Vitals:   02/20/23 1000  TempSrc:   PainSc: 0-No pain                 Windell Norfolk

## 2023-02-21 DIAGNOSIS — R1312 Dysphagia, oropharyngeal phase: Secondary | ICD-10-CM | POA: Diagnosis present

## 2023-02-21 DIAGNOSIS — N3281 Overactive bladder: Secondary | ICD-10-CM | POA: Diagnosis present

## 2023-02-21 DIAGNOSIS — I10 Essential (primary) hypertension: Secondary | ICD-10-CM | POA: Diagnosis not present

## 2023-02-21 DIAGNOSIS — R41841 Cognitive communication deficit: Secondary | ICD-10-CM | POA: Diagnosis not present

## 2023-02-21 DIAGNOSIS — S43006A Unspecified dislocation of unspecified shoulder joint, initial encounter: Secondary | ICD-10-CM | POA: Diagnosis not present

## 2023-02-21 DIAGNOSIS — F411 Generalized anxiety disorder: Secondary | ICD-10-CM | POA: Diagnosis not present

## 2023-02-21 DIAGNOSIS — R1319 Other dysphagia: Secondary | ICD-10-CM | POA: Diagnosis present

## 2023-02-21 DIAGNOSIS — G9341 Metabolic encephalopathy: Secondary | ICD-10-CM | POA: Diagnosis not present

## 2023-02-21 DIAGNOSIS — K30 Functional dyspepsia: Secondary | ICD-10-CM | POA: Diagnosis present

## 2023-02-21 DIAGNOSIS — Z4789 Encounter for other orthopedic aftercare: Secondary | ICD-10-CM | POA: Diagnosis not present

## 2023-02-21 DIAGNOSIS — F331 Major depressive disorder, recurrent, moderate: Secondary | ICD-10-CM | POA: Diagnosis not present

## 2023-02-21 DIAGNOSIS — R112 Nausea with vomiting, unspecified: Secondary | ICD-10-CM | POA: Diagnosis present

## 2023-02-21 DIAGNOSIS — M6281 Muscle weakness (generalized): Secondary | ICD-10-CM | POA: Diagnosis not present

## 2023-02-21 DIAGNOSIS — R569 Unspecified convulsions: Secondary | ICD-10-CM | POA: Diagnosis present

## 2023-02-21 DIAGNOSIS — Z7401 Bed confinement status: Secondary | ICD-10-CM | POA: Diagnosis not present

## 2023-02-21 DIAGNOSIS — R2689 Other abnormalities of gait and mobility: Secondary | ICD-10-CM | POA: Diagnosis not present

## 2023-02-21 DIAGNOSIS — N179 Acute kidney failure, unspecified: Secondary | ICD-10-CM | POA: Diagnosis present

## 2023-02-21 DIAGNOSIS — F0390 Unspecified dementia without behavioral disturbance: Secondary | ICD-10-CM | POA: Diagnosis not present

## 2023-02-21 DIAGNOSIS — F419 Anxiety disorder, unspecified: Secondary | ICD-10-CM | POA: Diagnosis present

## 2023-02-21 DIAGNOSIS — E441 Mild protein-calorie malnutrition: Secondary | ICD-10-CM | POA: Diagnosis present

## 2023-02-21 DIAGNOSIS — E039 Hypothyroidism, unspecified: Secondary | ICD-10-CM | POA: Diagnosis present

## 2023-02-21 DIAGNOSIS — R5381 Other malaise: Secondary | ICD-10-CM | POA: Diagnosis not present

## 2023-02-21 DIAGNOSIS — D631 Anemia in chronic kidney disease: Secondary | ICD-10-CM | POA: Diagnosis present

## 2023-02-21 DIAGNOSIS — R410 Disorientation, unspecified: Secondary | ICD-10-CM | POA: Diagnosis not present

## 2023-02-21 DIAGNOSIS — H40051 Ocular hypertension, right eye: Secondary | ICD-10-CM | POA: Diagnosis present

## 2023-02-21 DIAGNOSIS — E785 Hyperlipidemia, unspecified: Secondary | ICD-10-CM | POA: Diagnosis present

## 2023-02-21 DIAGNOSIS — K219 Gastro-esophageal reflux disease without esophagitis: Secondary | ICD-10-CM | POA: Diagnosis not present

## 2023-02-21 DIAGNOSIS — S43014D Anterior dislocation of right humerus, subsequent encounter: Secondary | ICD-10-CM | POA: Diagnosis not present

## 2023-02-21 DIAGNOSIS — N390 Urinary tract infection, site not specified: Secondary | ICD-10-CM | POA: Diagnosis not present

## 2023-02-21 DIAGNOSIS — R262 Difficulty in walking, not elsewhere classified: Secondary | ICD-10-CM | POA: Diagnosis not present

## 2023-02-21 DIAGNOSIS — H26493 Other secondary cataract, bilateral: Secondary | ICD-10-CM | POA: Diagnosis present

## 2023-02-21 MED ORDER — CEPHALEXIN 500 MG PO CAPS: 500.0000 mg | ORAL_CAPSULE | Freq: Three times a day (TID) | ORAL | 0 refills | Status: DC

## 2023-02-21 MED ORDER — PANTOPRAZOLE SODIUM 40 MG PO TBEC: 40.0000 mg | DELAYED_RELEASE_TABLET | Freq: Every day | ORAL | 1 refills | Status: AC

## 2023-02-21 MED ORDER — FUROSEMIDE 20 MG PO TABS: 20.0000 mg | ORAL_TABLET | ORAL | 1 refills | Status: AC

## 2023-02-21 MED ORDER — TRAMADOL HCL 50 MG PO TABS: 50.0000 mg | ORAL_TABLET | Freq: Four times a day (QID) | ORAL | 0 refills | Status: AC | PRN

## 2023-02-21 MED ORDER — SENNOSIDES-DOCUSATE SODIUM 8.6-50 MG PO TABS: 2.0000 | ORAL_TABLET | Freq: Two times a day (BID) | ORAL | 1 refills | Status: AC

## 2023-02-21 NOTE — Discharge Summary (Addendum)
Nicole Yoder, is a 87 y.o. female  DOB 26-Jun-1936  MRN [id number].  Admission date:  02/16/2023  Admitting Physician  Shon Hale, MD  Discharge Date:  02/21/2023   Primary MD  [name], MD  Recommendations for primary care physician for things to follow:  1)Avoid ibuprofen/Advil/Aleve/Motrin/Goody Powders/Naproxen/BC powders/Meloxicam/Diclofenac/Indomethacin and other Nonsteroidal anti-inflammatory medications as these will make you more likely to bleed and can cause stomach ulcers, can also cause Kidney problems.   2)Follow up with orthopedic surgeon Dr. Cathlean Sauer about 10 days--For Repeat Rt Shoulder Xray--   3)Repeat BMP in 4 to 5 days   4)Post-operative plan:  The patient will be NWB (Non -Weight Bearing) on the operative extremity, she is to remain in the sling Follow up plan will be scheduled in approximately 10 to14 days for incision check and XR of the right shoulder.  Admission Diagnosis  Acute metabolic encephalopathy [G93.41]   Discharge Diagnosis  Acute metabolic encephalopathy [G93.41]    Principal Problem:   Acute metabolic encephalopathy Active Problems:   UTI (urinary tract infection)   AKI (acute kidney injury) (HCC)   Anterior dislocation of right shoulder   Hypothyroidism   HTN (hypertension)     Past Medical History:  Diagnosis Date   Anxiety    Gait abnormality    High blood pressure    High cholesterol    Neuropathy    Urinary frequency     Past Surgical History:  Procedure Laterality Date   broken ribs     broken wrist     CARPAL TUNNEL RELEASE Bilateral    EYE SURGERY     GANGLION CYST EXCISION     TONSILLECTOMY AND ADENOIDECTOMY       HPI  from the history and physical done on the day of admission:   Chief Complaint: AMS   HPI: Nicole Yoder is a 87 y.o. female with medical history significant for neuropathy, anxiety, blood pressure,  hypothyroidism. Patient was brought to the ED from nursing home with reports of altered mental status-over the past 4 days.  At the time of my evaluation, patient is somnolent, arouses to vigorous touch stimulation, verbalizes, closes her eyes, some of which appears to be intentional.  Not answering questions.  History obtained from chart review and EDP. Nursing home also reported URI symptoms with cough, and hypoxia, started on a course of doxycycline.  EMS reports sats of 84 % room air.   ED Course: Sats 90 to 100% on 2L, on my evaluation sats 94% on room air.  Blood pressure systolic 10 3-140.  Heart rate 50s to 60s.  Respiratory rate 12-19. WBC 4.5.  Lactic acid 0.5.  Creatinine 1.89.  UA suggestive of UTI.  Chest x-ray-acute abnormality, shows bronchovascular crowding. IV ceftriaxone 2 g started.  1 liter bolus given.   Review of Systems: Unable to assess due to altered mental status    Hospital Course:   Brief Narrative:  87 y.o. female with medical history significant for neuropathy, anxiety, blood pressure, hypothyroidism admitted with  acute metabolic encephalopathy due to presumed UTI from SNF facility   -Assessment and Plan: 1) Acute metabolic encephalopathy due to E. coli UTI --CT no acute intracranial abnormality, suggests chronic fungal sinusitis -Chest x-ray nonacute- -mentation is back to baseline at this time -At baseline patient has some cognitive and memory deficits as per HCPOA   2)Right shoulder dislocation--POA -On further review admission chest x-ray suggested the patient already had - dislocation of the right shoulder on admission--however due to altered mentation/encephalopathy on admission patient was Not able to give a good history at the time No injury recalled,  -According to patient's healthcare power of attorney Ms. [name]--patient has had right shoulder pain and discomfort for a while -On admission patient was a poor historian due to metabolic  encephalopathy as above #1 -Additional history obtained from HCPOA---it appears patient had x-rays done at [facility] rehab facility with concerns about fracture dislocation at that time... However when patient got here she was too confused due to UTI to tell us much about the shoulder -The patient is Neurologically/mentation wise is much better Now , so she is able to relay concerns about right shoulder pain -overnight and this morning patient complained of right shoulder pain, on exam limited range of motion and discomfort with right upper extremity/shoulder range of motion -X-rays consistent with anterior glenohumeral dislocation -s/p  closed reduction of anterior Rt shoulder dislocation under anesthesia on 02/20/2023 by orthopedic surgeon Dr. Dallas Schimke -Post-operative plan:  The patient will be NWB (Non -Weight Bearing) on the operative extremity, she is to remain in the sling Follow up plan will be scheduled in approximately 10 to14 days for incision check and XR of the right shoulder.   3)AKI (acute kidney injury) (HCC) -Creatinine on admission was 1.89 currently trending down to 1.1 with hydration - renally adjust medications, avoid nephrotoxic agents / dehydration  / hypotension   4)E. coli UTI (urinary tract infection) -completed IV Rocephin for pansensitive E. Coli--last dose 02/20/2023 -No further antibiotics indicated   HTN (hypertension) -Continue atenolol and amlodipine -IV hydralazine as needed elevated BP   Hypothyroidism -Continue Synthroid   Chronic fungal sinusitis----outpatient follow-up with ID and ENT advised   Chronic anemia----Hgb stable above 12, Hgb initially dropped due to hydration/IV fluids   Disposition: The patient is from: SNF              Anticipated d/c is to: SNF--    Code Status :  -  Code Status: DNR    Family Communication: -Ms. [name]-HCPOA  Discharge Condition: Stable  Follow UP   Contact information for follow-up providers     [name], MD Follow up.   Specialty: Internal Medicine Why: Repeat BMP in 4 days Contact information: [address] [place] [address] [phone]         Oliver Barre, MD. Schedule an appointment as soon as possible for a visit in 12 day(s).   Specialties: Orthopedic Surgery, Sports Medicine Why: For Repeat Rt Shoulder Xray-- Contact information: [address] [place] [address] [phone]              Contact information for after-discharge care     Destination     [facility] .   Service: Skilled Nursing Contact information: [address] [place] [address] [phone]                     Consults obtained -orthopedics  Diet and Activity recommendation:  As advised  Discharge Instructions    Discharge Instructions     Call MD for:  difficulty breathing, headache or visual disturbances   Complete by: As directed    Call MD for:  persistant dizziness or light-headedness   Complete by: As directed    Call MD for:  persistant nausea and vomiting   Complete by: As directed    Call MD for:  temperature >100.4   Complete by: As directed    Diet - low sodium heart healthy   Complete by: As directed    Discharge instructions   Complete by: As directed    1)Avoid ibuprofen/Advil/Aleve/Motrin/Goody Powders/Naproxen/BC powders/Meloxicam/Diclofenac/Indomethacin and other Nonsteroidal anti-inflammatory medications as these will make you more likely to bleed and can cause stomach ulcers, can also cause Kidney problems.   2)Follow up with orthopedic surgeon Dr. Cathlean Sauer about 10 days  3)Repeat BMP in 4 to 5 days   4)Post-operative plan:  The patient will be NWB (Non -Weight Bearing) on the operative extremity, she is to remain in the sling Follow up plan will be scheduled in approximately 10 to14 days for incision check and XR of the right shoulder.   Increase activity slowly   Complete by: As  directed        Discharge Medications    Allergies as of 02/21/2023       Reactions   Prednisone Other (See Comments)   High blood pressure    Ace Inhibitors Other (See Comments)   No reaction listed on MAR.   Celebrex [celecoxib]    Codeine    Lyrica [pregabalin] Other (See Comments)   No reaction listed on MAR.   Neurontin [gabapentin]    Niacin And Related Other (See Comments)   No reaction listed on MAR   Penicillins Other (See Comments)   No reaction listed on MAR.   Shellfish Allergy Other (See Comments)   No reaction listed on MAR.   Shrimp (diagnostic) Other (See Comments)   No reaction listed on MAR.   Statins Other (See Comments)   Muscle aches   Sulfa Antibiotics    Tramadol Other (See Comments)   hallucinations   Zoloft [sertraline] Other (See Comments)   No reaction listed on MAR.   Ivp Dye [iodinated Contrast Media] Rash        Medication List     STOP taking these medications    doxycycline 100 MG tablet Commonly known as: VIBRA-TABS   Lokelma 5 g packet Generic drug: sodium zirconium cyclosilicate   sodium bicarbonate 650 MG tablet       TAKE these medications    acetaminophen 325 MG tablet Commonly known as: TYLENOL Take 650 mg by mouth every 8 (eight) hours as needed for mild pain.   amLODipine 5 MG tablet Commonly known as: NORVASC Take 5 mg by mouth daily.   atenolol 50 MG tablet Commonly known as: TENORMIN Take 50 mg by mouth daily.   Biofreeze 4 % Gel Generic drug: Menthol (Topical Analgesic) Apply 1 application  topically every 8 (eight) hours as needed (right shoulder pain).   citalopram 10 MG tablet Commonly known as: CELEXA Take 10 mg by mouth daily.   clonazePAM 0.5 MG tablet Commonly known as: KLONOPIN Take 0.5 mg by mouth 2 (two) times daily.   furosemide 20 MG tablet Commonly known as: LASIX Take 1 tablet (20 mg total) by mouth every Tuesday, Thursday, Saturday, and Sunday. Start taking on: February 22, 2023 What changed: when to take this  lamoTRIgine 25 MG tablet Commonly known as: LAMICTAL Take 25 mg by mouth 2 (two) times daily.   levothyroxine 88 MCG tablet Commonly known as: SYNTHROID Take 88 mcg by mouth daily before breakfast.   meclizine 12.5 MG tablet Commonly known as: ANTIVERT Take 25 mg by mouth every morning.   memantine 28 MG Cp24 24 hr capsule Commonly known as: NAMENDA XR Take 28 mg by mouth at bedtime.   ondansetron 4 MG tablet Commonly known as: ZOFRAN Take 4 mg by mouth every morning.   OXYGEN Inhale 2 L/min into the lungs 3 (three) times daily. 2 LPM via nasal cannula per standing orders every shift for hypoxia related to URI   pantoprazole 40 MG tablet Commonly known as: Protonix Take 1 tablet (40 mg total) by mouth daily. What changed:  medication strength how much to take when to take this   Rhopressa 0.02 % Soln Generic drug: Netarsudil Dimesylate Place 1 drop into the right eye at bedtime.   Robitussin Peak Cold DM 10-100 MG/5ML liquid Generic drug: Dextromethorphan-guaiFENesin Take 10 mLs by mouth every 4 (four) hours as needed (cough).   senna-docusate 8.6-50 MG tablet Commonly known as: Senokot-S Take 2 tablets by mouth 2 (two) times daily.   traMADol 50 MG tablet Commonly known as: Ultram Take 1 tablet (50 mg total) by mouth every 6 (six) hours as needed.       Major procedures and Radiology Reports - PLEASE review detailed and final reports for all details, in brief -   DG Shoulder Right Port  Result Date: 02/20/2023 CLINICAL DATA:  87 year old female with possible shoulder dislocation. EXAM: RIGHT SHOULDER - 1 VIEW COMPARISON:  02/19/2023. FINDINGS: AP in 2 attempted scapular Y-views of the right shoulder. Both of the attempted Y-views are oblique. On the 2nd view there is again evidence of anterior dislocation or subluxation of the humeral head. The humeral head appears severely degenerated, with bulky osteophytosis.  Subcoracoid appearance of the humeral head also on recent portable chest x-ray 02/16/2023. No acute fracture identified. Stable visible right chest. IMPRESSION: 1. Evidence of ongoing right shoulder glenohumeral anterior subluxation versus dislocation. 2. Superimposed severe degeneration of the humeral head with bulky osteophytosis. No acute fracture identified. Electronically Signed   By: Odessa Fleming M.D.   On: 02/20/2023 10:34   DG C-Arm 1-60 Min-No Report  Result Date: 02/20/2023 Fluoroscopy was utilized by the requesting physician.  No radiographic interpretation.   DG Elbow 2 Views Right  Result Date: 02/19/2023 CLINICAL DATA:  144615 Pain 144615 EXAM: RIGHT ELBOW - 2 VIEW COMPARISON:  04/21/2017 FINDINGS: There is no evidence of fracture, dislocation, or joint effusion. The joint is not well profiled on the AP view however. There is no evidence of arthropathy or other focal bone abnormality. Soft tissues are unremarkable. IMPRESSION: Negative. Electronically Signed   By: Corlis Leak M.D.   On: 02/19/2023 10:10   DG Shoulder Right  Result Date: 02/19/2023 CLINICAL DATA:  144615 Pain 144615 EXAM: RIGHT SHOULDER - 2+ VIEW COMPARISON:  None available FINDINGS: Anterior glenohumeral dislocation. No definite fracture. Spurring from the humeral head. Acromioclavicular DJD. IMPRESSION: Anterior glenohumeral dislocation. Electronically Signed   By: Corlis Leak M.D.   On: 02/19/2023 10:09   CT HEAD WO CONTRAST ( )  Result Date: 02/16/2023 CLINICAL DATA:  Altered mental status EXAM: CT HEAD WITHOUT CONTRAST TECHNIQUE: Contiguous axial images were obtained from the base of the skull through the vertex without intravenous contrast. RADIATION DOSE REDUCTION: This exam was performed  according to the departmental dose-optimization program which includes automated exposure control, adjustment of the mA and/or kV according to patient size and/or use of iterative reconstruction technique. COMPARISON:  Images of previous  study done on 08/04/2021 are not available for comparison. Report for the previous study was revealed. FINDINGS: Brain: No acute intracranial findings are seen. Cortical sulci are prominent. There are no signs of bleeding within the cranium. There is decreased density in periventricular and subcortical white matter. Possible small old lacunar infarcts are seen in basal ganglia. There is 1.4 cm dense calcification inseparable from the falx suggesting possible meningioma. There is no adjacent edema or mass effect. Vascular: Scattered arterial calcifications are seen. Skull: No acute findings are seen. Sinuses/Orbits: There is opacification of left maxillary sinus. There are coarse calcifications in the left maxillary antrum. There is medial bulging of medial wall of the left maxillary sinus. There is mucosal thickening in ethmoid sinuses. Other: There is increased amount of CSF in the sella suggesting partial empty sella. IMPRESSION: No acute intracranial findings are seen. Atrophy. Small vessel disease. There is 1.4 cm dense calcification inseparable from the right side of the falx suggesting possible meningioma. There is no adjacent edema or mass effect. There is opacification in the left maxillary antrum. There are coarse calcifications in the left maxillary antrum. Findings suggest possible chronic fungal sinusitis. Chronic ethmoid sinusitis. Electronically Signed   By: Ernie Avena M.D.   On: 02/16/2023 17:58   DG Chest Port 1 View  Result Date: 02/16/2023 CLINICAL DATA:  Four day history of altered mental status EXAM: PORTABLE CHEST 1 VIEW COMPARISON:  None Available. FINDINGS: Low lung volumes with bronchovascular crowding. No focal consolidations. No pleural effusion or pneumothorax. Enlarged cardiomediastinal silhouette is likely projectional. No acute osseous abnormality. IMPRESSION: Low lung volumes with bronchovascular crowding. No focal consolidations. Electronically Signed   By: Agustin Cree M.D.    On: 02/16/2023 11:34    Micro Results   Recent Results (from the past 240 hour(s))  Blood Culture (routine x 2)     Status: None (Preliminary result)   Collection Time: 02/16/23 11:17 AM   Specimen: BLOOD  Result Value Ref Range Status   Specimen Description BLOOD RIGHT ANTECUBITAL  Final   Special Requests   Final    BOTTLES DRAWN AEROBIC AND ANAEROBIC Blood Culture adequate volume   Culture   Final    NO GROWTH 4 DAYS Performed at [facility], [address] [place], [place] [address]    Report Status PENDING  Incomplete  Blood Culture (routine x 2)     Status: None (Preliminary result)   Collection Time: 02/16/23 11:26 AM   Specimen: BLOOD  Result Value Ref Range Status   Specimen Description BLOOD LEFT ANTECUBITAL  Final   Special Requests   Final    BOTTLES DRAWN AEROBIC AND ANAEROBIC Blood Culture results may not be optimal due to an excessive volume of blood received in culture bottles   Culture   Final    NO GROWTH 4 DAYS Performed at [facility], [address], [place], [place] [address]    Report Status PENDING  Incomplete  Resp panel by RT-PCR (RSV, Flu A&B, Covid) Anterior Nasal Swab     Status: None   Collection Time: 02/16/23 11:28 AM   Specimen: Anterior Nasal Swab  Result Value Ref Range Status   SARS Coronavirus 2 by RT PCR NEGATIVE NEGATIVE Final    Comment: (NOTE) SARS-CoV-2 target nucleic acids are NOT DETECTED.  The SARS-CoV-2 RNA is generally detectable in upper respiratory specimens during the acute phase of infection. The lowest concentration of SARS-CoV-2 viral copies this assay can detect is 138 copies/mL. A negative result does not preclude SARS-Cov-2 infection and should not be used as the sole basis for treatment or other patient management decisions. A negative result may occur with  improper specimen collection/handling, submission of specimen other than nasopharyngeal swab, presence of viral mutation(s) within the areas targeted by this  assay, and inadequate number of viral copies(<138 copies/mL). A negative result must be combined with clinical observations, patient history, and epidemiological information. The expected result is Negative.  Fact Sheet for Patients:  BloggerCourse.com  Fact Sheet for Healthcare Providers:  SeriousBroker.it  This test is no t yet approved or cleared by the [place] FDA and  has been authorized for detection and/or diagnosis of SARS-CoV-2 by FDA under an Emergency Use Authorization (EUA). This EUA will remain  in effect (meaning this test can be used) for the duration of the COVID-19 declaration under Section 564(b)(1) of the Act, 21 U.S.C.section 360bbb-3(b)(1), unless the authorization is terminated  or revoked sooner.       Influenza A by PCR NEGATIVE NEGATIVE Final   Influenza B by PCR NEGATIVE NEGATIVE Final    Comment: (NOTE) The Xpert Xpress SARS-CoV-2/FLU/RSV plus assay is intended as an aid in the diagnosis of influenza from Nasopharyngeal swab specimens and should not be used as a sole basis for treatment. Nasal washings and aspirates are unacceptable for Xpert Xpress SARS-CoV-2/FLU/RSV testing.  Fact Sheet for Patients: BloggerCourse.com  Fact Sheet for Healthcare Providers: SeriousBroker.it  This test is not yet approved or cleared by the [place] FDA and has been authorized for detection and/or diagnosis of SARS-CoV-2 by FDA under an Emergency Use Authorization (EUA). This EUA will remain in effect (meaning this test can be used) for the duration of the COVID-19 declaration under Section 564(b)(1) of the Act, 21 U.S.C. section 360bbb-3(b)(1), unless the authorization is terminated or revoked.     Resp Syncytial Virus by PCR NEGATIVE NEGATIVE Final    Comment: (NOTE) Fact Sheet for Patients: BloggerCourse.com  Fact Sheet for  Healthcare Providers: SeriousBroker.it  This test is not yet approved or cleared by the [place] FDA and has been authorized for detection and/or diagnosis of SARS-CoV-2 by FDA under an Emergency Use Authorization (EUA). This EUA will remain in effect (meaning this test can be used) for the duration of the COVID-19 declaration under Section 564(b)(1) of the Act, 21 U.S.C. section 360bbb-3(b)(1), unless the authorization is terminated or revoked.  Performed at [facility], [address] [place], [place] [address]   Urine Culture     Status: Abnormal   Collection Time: 02/16/23 11:37 AM   Specimen: Urine, Random  Result Value Ref Range Status   Specimen Description   Final    URINE, RANDOM Performed at [facility], [address] [place], [place] [address]    Special Requests   Final    NONE Reflexed from [id number] Performed at [facility], [address] [place], [place] [address]    Culture >=100,000 COLONIES/mL ESCHERICHIA COLI (A)  Final   Report Status 02/18/2023 FINAL  Final   Organism ID, Bacteria ESCHERICHIA COLI (A)  Final      Susceptibility   Escherichia coli - MIC*    AMPICILLIN 4 SENSITIVE Sensitive     CEFAZOLIN <=4 SENSITIVE Sensitive     CEFEPIME <=0.12 SENSITIVE Sensitive  CEFTRIAXONE <=0.25 SENSITIVE Sensitive     CIPROFLOXACIN >=4 RESISTANT Resistant     GENTAMICIN <=1 SENSITIVE Sensitive     IMIPENEM <=0.25 SENSITIVE Sensitive     NITROFURANTOIN <=16 SENSITIVE Sensitive     TRIMETH/SULFA <=20 SENSITIVE Sensitive     AMPICILLIN/SULBACTAM <=2 SENSITIVE Sensitive     PIP/TAZO <=4 SENSITIVE Sensitive     * >=100,000 COLONIES/mL ESCHERICHIA COLI  MRSA Next Gen by PCR, Nasal     Status: None   Collection Time: 02/16/23  6:45 PM   Specimen: Nasal Mucosa; Nasal Swab  Result Value Ref Range Status   MRSA by PCR Next Gen NOT DETECTED NOT DETECTED Final    Comment: (NOTE) The GeneXpert MRSA Assay (FDA approved for NASAL  specimens only), is one component of a comprehensive MRSA colonization surveillance program. It is not intended to diagnose MRSA infection nor to guide or monitor treatment for MRSA infections. Test performance is not FDA approved in patients less than 90 years old. Performed at [facility], [address] [place], [place] [address]    Today   Subjective   Nicole Yoder today has no new complaints No fever  Or chills   No Nausea, Vomiting or Diarrhea       Patient has been seen and examined prior to discharge   Objective   Blood pressure (!) 155/66, pulse (!) 56, temperature 98.8 F (37.1 C), temperature source Axillary, resp. rate 16, height 5\' 1"  (1.549 m), weight 67.7 kg, SpO2 96 %.   Intake/Output Summary (Last 24 hours) at 02/21/2023 1032 Last data filed at 02/21/2023 0447 Gross per 24 hour  Intake 166.57 ml  Output 400 ml  Net -233.43 ml    Exam Gen:- Awake Alert, no acute distress ,  HEENT:- [place].AT, No sclera icterus Neck-Supple Neck,No JVD,.  Lungs-  CTAB , good air movement bilaterally CV- S1, S2 normal, regular Abd-  +ve B.Sounds, Abd Soft, No tenderness,    Extremity/Skin:- No  edema,   good pulses Psych-affect is appropriate, oriented x3 Neuro-generalized weakness, no new focal deficits, no tremors  MSK- radial pulses is good , improved range of motion and improved discomfort after closed reduction on 02/20/2023 -Right upper extremities is in a sling    Data Review   CBC w Diff:  Lab Results  Component Value Date   WBC 5.1 02/20/2023   HGB 12.5 02/20/2023   HGB 12.5 10/31/2019   HCT 38.8 02/20/2023   HCT 38.6 10/31/2019   PLT 274 02/20/2023   PLT 216 10/31/2019   LYMPHOPCT 29 02/16/2023   MONOPCT 11 02/16/2023   EOSPCT 3 02/16/2023   BASOPCT 0 02/16/2023    CMP:  Lab Results  Component Value Date   NA 136 02/20/2023   NA 142 10/31/2019   K 3.9 02/20/2023   CL 99 02/20/2023   CO2 28 02/20/2023   BUN 19 02/20/2023   BUN 32 (H)  10/31/2019   CREATININE 1.12 (H) 02/20/2023   PROT 6.2 (L) 02/16/2023   PROT 6.6 10/31/2019   ALBUMIN 3.0 (L) 02/16/2023   ALBUMIN 4.3 10/31/2019   BILITOT 0.8 02/16/2023   BILITOT 0.4 10/31/2019   ALKPHOS 77 02/16/2023   AST 14 (L) 02/16/2023   ALT 13 02/16/2023  .  Total Discharge time is about 33 minutes  Shon Hale M.D on 02/21/2023 at 10:32 AM  Go to [email or web address] -  for contact info  [facility] - Office  [phone]

## 2023-02-21 NOTE — Care Management Important Message (Signed)
Important Message  Patient Details  Name: Nicole Yoder MRN: [id number] Date of Birth: 1935/10/02   Medicare Important Message Given:  Yes     Corey Harold 02/21/2023, 10:51 AM

## 2023-02-21 NOTE — TOC Transition Note (Signed)
Transition of Care [facility]) - CM/SW Discharge Note   Patient Details  Name: Nicole Yoder MRN: [id number] Date of Birth: 30-Nov-1935  Transition of Care [facility]) CM/SW Contact:  Annice Needy, LCSW Phone Number: 02/21/2023, 11:06 AM   Clinical Narrative:    D/c clinicals sent to facility. Message left for niece. Nurse to call report. [facility] contacted for transport. TOC signing off.    Final next level of care: Skilled Nursing Facility Barriers to Discharge: No Barriers Identified   Patient Goals and CMS Choice      Discharge Placement                Patient chooses bed at: [facility] Patient to be transferred to facility by: [facility] Name of family member notified: message left for [name] Patient and family notified of of transfer: 02/21/23  Discharge Plan and Services Additional resources added to the After Visit Summary for   In-house Referral: Clinical Social Work   Post Acute Care Choice: Resumption of Svcs/PTA Provider                               Social Determinants of Health (SDOH) Interventions SDOH Screenings   Food Insecurity: No Food Insecurity (02/16/2023)  Housing: Low Risk  (02/16/2023)  Transportation Needs: No Transportation Needs (02/16/2023)  Utilities: Not At Risk (02/16/2023)  Tobacco Use: Low Risk  (05/29/2020)     Readmission Risk Interventions     No data to display

## 2023-02-21 NOTE — Consult Note (Signed)
[facility] Customer service manager [facility]) Accountable Care Organization (ACO) [facility] Liaison Note  02/21/2023  Nicole Yoder 1936/06/29 [id number]  Location: [facility] RN Hospital Liaison screened the patient remotely at [facility].  Insurance: MCR ACO   Nicole Yoder is a 87 y.o. female who is a Primary Care Patient of [facility] Sherryll [facility] Internal Medicine). The patient was screened for  readmission hospitalization with noted low risk score for unplanned readmission risk with 1 IP in 6 months.  The patient was assessed for potential [facility]) Care Management service needs for post hospital transition for care coordination. Review of patient's electronic medical record reveals patient was admitted for Acute metabolic encephalopathy. Pt discharged to [facility] and this facility will continue to address the pt's needs.    [facility] Care Management/Population Health does not replace or interfere with any arrangements made by the Inpatient Transition of Care team.   For questions contact:   Elliot Cousin, RN, BSN [facility] Liaison [place]   [facility]  Population Health Office Hours MTWF  8:00 am-6:00 pm Off on Thursday [phone] mobile [phone] [Office toll free line][facility] Office Hours are M-F 8:30 - 5 pm 24 hour nurse advise line [phone] Concierge  [email or web address]

## 2023-02-21 NOTE — Discharge Instructions (Addendum)
1)Avoid ibuprofen/Advil/Aleve/Motrin/Goody Powders/Naproxen/BC powders/Meloxicam/Diclofenac/Indomethacin and other Nonsteroidal anti-inflammatory medications as these will make you more likely to bleed and can cause stomach ulcers, can also cause Kidney problems.   2)Follow up with orthopedic surgeon Dr. Cathlean Sauer about 10 days For Repeat Rt Shoulder Xray--   3)Repeat BMP in 4 to 5 days   4)Post-operative plan:  The patient will be NWB (Non -Weight Bearing) on the operative extremity, she is to remain in the sling Follow up plan will be scheduled in approximately 10 to14 days for incision check and XR of the right shoulder.

## 2023-02-21 NOTE — Progress Notes (Signed)
   ORTHOPAEDIC PROGRESS NOTE  s/p Procedure(s): Closed reduction right shoulder dislocation  DOS: 02/21/2023  SUBJECTIVE: No issues overnight.  She still has pain in her right shoulder.  She has been able to use her elbow and her hand.  She is getting assistance with her breakfast this morning.  After updating the patient's healthcare power of attorney, and having a discussion in regards to the right shoulder, there is a good chance that this is chronic.  Patient does have limited use of the right shoulder for couple of years.  However, there is no confirmation that shoulder was previously located.  In complaining of bilateral shoulder pain, she may have acute worsening of this pain.  Nonetheless, I am happy to follow her in clinic.  We could proceed with advanced imaging, but any further treatment could be extensive, and we will have to weigh the risks and benefits.  OBJECTIVE: PE:  Elderly female.  Alert and pleasant  Right shoulder in a sling.  She has some ecchymosis over the anterior shoulder.  Sensation is intact in the axillary nerve distribution.  Sensation intact throughout the right hand.  She will wiggle her fingers.  She has some tenderness throughout the right shoulder.  Vitals:   02/20/23 2144 02/21/23 0444  BP: (!) 155/62 (!) 155/66  Pulse: (!) 52 (!) 56  Resp:  16  Temp:  98.8 F (37.1 C)  SpO2:  96%     ASSESSMENT: Nicole Yoder is a 87 y.o. female stable following anesthesia for closed reduction of the right shoulder.   Intraoperative fluoroscopic views of the right shoulder demonstrates reduction of the shoulder.  However, the postreduction x-rays in the PACU were obtained which did demonstrate subluxation versus recurrent dislocation.  Shoulder is very unstable, and may be a chronic deformity.  PLAN: Weightbearing: NWB RUE Insicional and dressing care: Reinforce dressings as needed on the dorsal hand skin tears Orthopedic device(s):  Sling to the right  shoulder at all times VTE prophylaxis: At the discretion of the hospitalist, no orthopedic contraindications Pain control: As needed Follow - up plan:   Happy to see patient in follow-up, to discuss further treatment for her right shoulder.   Contact information:     Buck Mcaffee A. Dallas Schimke, MD MS [facility] [address] [place],  [place]  [address] Phone: [phone] Fax: [phone]

## 2023-02-21 NOTE — Progress Notes (Signed)
Patient discharged back to [facility], transported by [facility]. Discharge paperwork placed in discharge packet to give to facility. Belongings sent with patient.

## 2023-02-22 ENCOUNTER — Ambulatory Visit (HOSPITAL_COMMUNITY): Admission: RE | Admit: 2023-02-22 | Payer: Medicare Other | Source: Ambulatory Visit

## 2023-02-22 ENCOUNTER — Encounter (HOSPITAL_COMMUNITY): Payer: Self-pay | Admitting: Orthopedic Surgery

## 2023-02-22 DIAGNOSIS — R5381 Other malaise: Secondary | ICD-10-CM | POA: Diagnosis not present

## 2023-02-22 DIAGNOSIS — N390 Urinary tract infection, site not specified: Secondary | ICD-10-CM | POA: Diagnosis not present

## 2023-02-22 LAB — CULTURE, BLOOD (ROUTINE X 2): Culture: NO GROWTH

## 2023-02-23 ENCOUNTER — Telehealth: Payer: Self-pay | Admitting: Orthopedic Surgery

## 2023-02-23 DIAGNOSIS — S43006A Unspecified dislocation of unspecified shoulder joint, initial encounter: Secondary | ICD-10-CM | POA: Diagnosis not present

## 2023-02-23 DIAGNOSIS — F0390 Unspecified dementia without behavioral disturbance: Secondary | ICD-10-CM | POA: Diagnosis not present

## 2023-02-23 DIAGNOSIS — I10 Essential (primary) hypertension: Secondary | ICD-10-CM | POA: Diagnosis not present

## 2023-02-23 DIAGNOSIS — K219 Gastro-esophageal reflux disease without esophagitis: Secondary | ICD-10-CM | POA: Diagnosis not present

## 2023-02-23 DIAGNOSIS — R41841 Cognitive communication deficit: Secondary | ICD-10-CM | POA: Diagnosis not present

## 2023-02-23 DIAGNOSIS — F331 Major depressive disorder, recurrent, moderate: Secondary | ICD-10-CM | POA: Diagnosis not present

## 2023-02-23 DIAGNOSIS — F411 Generalized anxiety disorder: Secondary | ICD-10-CM | POA: Diagnosis not present

## 2023-02-23 DIAGNOSIS — R262 Difficulty in walking, not elsewhere classified: Secondary | ICD-10-CM | POA: Diagnosis not present

## 2023-02-23 NOTE — Telephone Encounter (Signed)
Dr. Dallas Schimke spoke with pt niece about concerns. No further questions at this time.

## 2023-02-23 NOTE — Telephone Encounter (Signed)
Dr. Dallas Schimke pt - Nicole Yoder, pt niece lvm stating she has POA [phone] and is requesting to speak to someone about the patient's surgery.  She stated that the patient isn't wanting to wear the sling.  Judeth Cornfield would like a call back.

## 2023-02-26 DIAGNOSIS — F411 Generalized anxiety disorder: Secondary | ICD-10-CM | POA: Diagnosis not present

## 2023-02-26 DIAGNOSIS — F331 Major depressive disorder, recurrent, moderate: Secondary | ICD-10-CM | POA: Diagnosis not present

## 2023-03-02 DIAGNOSIS — F411 Generalized anxiety disorder: Secondary | ICD-10-CM | POA: Diagnosis not present

## 2023-03-02 DIAGNOSIS — F0390 Unspecified dementia without behavioral disturbance: Secondary | ICD-10-CM | POA: Diagnosis not present

## 2023-03-02 DIAGNOSIS — F331 Major depressive disorder, recurrent, moderate: Secondary | ICD-10-CM | POA: Diagnosis not present

## 2023-03-04 ENCOUNTER — Encounter: Payer: Medicare Other | Admitting: Orthopedic Surgery

## 2023-03-08 ENCOUNTER — Other Ambulatory Visit: Payer: Self-pay | Admitting: *Deleted

## 2023-03-08 NOTE — Patient Outreach (Signed)
Per [facility] Nicole Yoder resides in [facility] skilled nursing facility. Screening for potential [facility] care coordination services as benefit of health plan.  Collaboration with [facility] social worker. Nicole Yoder is a long term resident.   No identifiable care coordination needs.   Raiford Noble, MSN, RN,BSN [facility] Post Acute Care Coordinator [phone] (Direct dial)

## 2023-03-09 ENCOUNTER — Telehealth: Payer: Self-pay | Admitting: Orthopedic Surgery

## 2023-03-09 NOTE — Telephone Encounter (Signed)
Nicole Yoder from [facility]  [phone] called to schedule the patient.  I returned the call and got her voicemail, left message to call our office to schedule appt.

## 2023-03-16 ENCOUNTER — Ambulatory Visit (INDEPENDENT_AMBULATORY_CARE_PROVIDER_SITE_OTHER): Payer: Medicare Other | Admitting: Orthopedic Surgery

## 2023-03-16 ENCOUNTER — Encounter: Payer: Self-pay | Admitting: Orthopedic Surgery

## 2023-03-16 VITALS — BP 138/69 | HR 59

## 2023-03-16 DIAGNOSIS — F0390 Unspecified dementia without behavioral disturbance: Secondary | ICD-10-CM | POA: Diagnosis not present

## 2023-03-16 DIAGNOSIS — F411 Generalized anxiety disorder: Secondary | ICD-10-CM | POA: Diagnosis not present

## 2023-03-16 DIAGNOSIS — M24411 Recurrent dislocation, right shoulder: Secondary | ICD-10-CM

## 2023-03-16 DIAGNOSIS — F331 Major depressive disorder, recurrent, moderate: Secondary | ICD-10-CM | POA: Diagnosis not present

## 2023-03-16 NOTE — Progress Notes (Signed)
Orthopaedic Clinic Return  Assessment: ELEXA KIVI is a 87 y.o. female with the following: Chronic right shoulder dislocation  Plan: Mrs. Scotti has a chronic right shoulder dislocation.  This is limiting her function.  Radiographs confirmed reduction of the shoulder in the operating room.  However, it quickly dislocated once again.  This was discussed with the patient, and I previously discussed this with her family.  Unclear how long her shoulder has been dislocated.  If interested, we could consider proceeding with an open reduction, possible reconstruction.  She states she is not interested in surgery.  She does not need a sling.  She can continue use of her right arm as tolerated.  Medicines as needed.  If she changes her mind, and would like to consider surgery, she will contact clinic.  If she has further issues, she will return to clinic.   Follow-up: Return if symptoms worsen or fail to improve.   Subjective:  Chief Complaint  Patient presents with   Post-op Problem    R shoulder dislocation DOS 02/20/23, pt shoulder has come back out of place since surgery.     History of Present Illness: TAKIRA SHERRIN is a 87 y.o. female who returns to clinic for repeat evaluation of right shoulder pain.  I saw her in the hospital.  She was noted to have a right shoulder dislocation.  No specific injury.  Unclear how long it was dislocated.  In the operating room, we were able to reduce the shoulder, and have x-rays confirming appropriate reduction.  However, between the operating room and the recovery room, her shoulder has dislocated again.  This is most likely indicative of a chronic dislocation.  She does continue to have some pain.  She is able to use her arms below the level of her shoulder, with difficulty getting her arm to her head.  Pain is in the anterior aspect of the shoulder.  She is not interested in surgery.  Review of Systems: No fevers or chills No numbness or  tingling No chest pain No shortness of breath No bowel or bladder dysfunction No GI distress No headaches   Objective: BP 138/69   Pulse (!) 59   Physical Exam:  Elderly female.  Seated in wheelchair.  Alert and oriented.  Right shoulder with mild deformity.  She has pain with range of motion at about the level of her shoulder.  No bruising.  No swelling.  Fingers are warm and well-perfused.  Sensation intact throughout the right upper extremity.  IMAGING: I personally ordered and reviewed the following images:  No new imaging obtained today.  Oliver Barre, MD 03/16/2023 11:20 AM

## 2023-03-30 ENCOUNTER — Ambulatory Visit: Payer: Medicare Other | Admitting: Orthopedic Surgery

## 2023-03-30 DIAGNOSIS — F411 Generalized anxiety disorder: Secondary | ICD-10-CM | POA: Diagnosis not present

## 2023-03-30 DIAGNOSIS — F0390 Unspecified dementia without behavioral disturbance: Secondary | ICD-10-CM | POA: Diagnosis not present

## 2023-03-30 DIAGNOSIS — F331 Major depressive disorder, recurrent, moderate: Secondary | ICD-10-CM | POA: Diagnosis not present

## 2023-04-07 ENCOUNTER — Ambulatory Visit (HOSPITAL_COMMUNITY)
Admission: RE | Admit: 2023-04-07 | Discharge: 2023-04-07 | Disposition: A | Discharge status: Home / Self Care | Payer: Medicare Other | Source: Ambulatory Visit | Attending: Nephrology | Admitting: Nephrology

## 2023-04-07 DIAGNOSIS — R600 Localized edema: Secondary | ICD-10-CM | POA: Insufficient documentation

## 2023-04-07 LAB — ECHOCARDIOGRAM COMPLETE
Area-P 1/2: 2.74 cm2
MV M vel: 2.76 m/s
MV Peak grad: 30.5 mmHg
S' Lateral: 3.5 cm

## 2023-04-07 NOTE — Progress Notes (Signed)
  Echocardiogram 2D Echocardiogram has been performed.  Nicole Yoder 04/07/2023, 9:21 AM

## 2023-04-13 DIAGNOSIS — F331 Major depressive disorder, recurrent, moderate: Secondary | ICD-10-CM | POA: Diagnosis not present

## 2023-04-13 DIAGNOSIS — F411 Generalized anxiety disorder: Secondary | ICD-10-CM | POA: Diagnosis not present

## 2023-04-13 DIAGNOSIS — F0390 Unspecified dementia without behavioral disturbance: Secondary | ICD-10-CM | POA: Diagnosis not present

## 2023-04-19 DIAGNOSIS — F411 Generalized anxiety disorder: Secondary | ICD-10-CM | POA: Diagnosis not present

## 2023-04-19 DIAGNOSIS — F331 Major depressive disorder, recurrent, moderate: Secondary | ICD-10-CM | POA: Diagnosis not present

## 2023-04-20 DIAGNOSIS — F411 Generalized anxiety disorder: Secondary | ICD-10-CM | POA: Diagnosis not present

## 2023-04-20 DIAGNOSIS — F331 Major depressive disorder, recurrent, moderate: Secondary | ICD-10-CM | POA: Diagnosis not present

## 2023-04-22 DIAGNOSIS — Z961 Presence of intraocular lens: Secondary | ICD-10-CM | POA: Diagnosis not present

## 2023-04-22 DIAGNOSIS — H40051 Ocular hypertension, right eye: Secondary | ICD-10-CM | POA: Diagnosis not present

## 2023-04-25 DIAGNOSIS — E039 Hypothyroidism, unspecified: Secondary | ICD-10-CM | POA: Diagnosis not present

## 2023-04-25 DIAGNOSIS — R569 Unspecified convulsions: Secondary | ICD-10-CM | POA: Diagnosis not present

## 2023-04-25 DIAGNOSIS — I10 Essential (primary) hypertension: Secondary | ICD-10-CM | POA: Diagnosis not present

## 2023-04-25 DIAGNOSIS — F411 Generalized anxiety disorder: Secondary | ICD-10-CM | POA: Diagnosis not present

## 2023-04-25 DIAGNOSIS — F331 Major depressive disorder, recurrent, moderate: Secondary | ICD-10-CM | POA: Diagnosis not present

## 2023-04-26 DIAGNOSIS — M6281 Muscle weakness (generalized): Secondary | ICD-10-CM | POA: Diagnosis not present

## 2023-04-27 DIAGNOSIS — M6281 Muscle weakness (generalized): Secondary | ICD-10-CM | POA: Diagnosis not present

## 2023-04-27 DIAGNOSIS — F331 Major depressive disorder, recurrent, moderate: Secondary | ICD-10-CM | POA: Diagnosis not present

## 2023-04-27 DIAGNOSIS — F411 Generalized anxiety disorder: Secondary | ICD-10-CM | POA: Diagnosis not present

## 2023-04-27 DIAGNOSIS — F0390 Unspecified dementia without behavioral disturbance: Secondary | ICD-10-CM | POA: Diagnosis not present

## 2023-04-28 DIAGNOSIS — M6281 Muscle weakness (generalized): Secondary | ICD-10-CM | POA: Diagnosis not present

## 2023-04-29 DIAGNOSIS — M6281 Muscle weakness (generalized): Secondary | ICD-10-CM | POA: Diagnosis not present

## 2023-04-30 DIAGNOSIS — I10 Essential (primary) hypertension: Secondary | ICD-10-CM | POA: Diagnosis not present

## 2023-04-30 DIAGNOSIS — D649 Anemia, unspecified: Secondary | ICD-10-CM | POA: Diagnosis not present

## 2023-05-02 DIAGNOSIS — M6281 Muscle weakness (generalized): Secondary | ICD-10-CM | POA: Diagnosis not present

## 2023-05-03 DIAGNOSIS — H6120 Impacted cerumen, unspecified ear: Secondary | ICD-10-CM | POA: Diagnosis not present

## 2023-05-03 DIAGNOSIS — U071 COVID-19: Secondary | ICD-10-CM | POA: Diagnosis not present

## 2023-05-04 DIAGNOSIS — E209 Hypoparathyroidism, unspecified: Secondary | ICD-10-CM | POA: Diagnosis not present

## 2023-05-04 DIAGNOSIS — I10 Essential (primary) hypertension: Secondary | ICD-10-CM | POA: Diagnosis not present

## 2023-05-04 DIAGNOSIS — E559 Vitamin D deficiency, unspecified: Secondary | ICD-10-CM | POA: Diagnosis not present

## 2023-05-11 DIAGNOSIS — F331 Major depressive disorder, recurrent, moderate: Secondary | ICD-10-CM | POA: Diagnosis not present

## 2023-05-11 DIAGNOSIS — E039 Hypothyroidism, unspecified: Secondary | ICD-10-CM | POA: Diagnosis not present

## 2023-05-11 DIAGNOSIS — F411 Generalized anxiety disorder: Secondary | ICD-10-CM | POA: Diagnosis not present

## 2023-05-11 DIAGNOSIS — E559 Vitamin D deficiency, unspecified: Secondary | ICD-10-CM | POA: Diagnosis not present

## 2023-05-11 DIAGNOSIS — F0390 Unspecified dementia without behavioral disturbance: Secondary | ICD-10-CM | POA: Diagnosis not present

## 2023-05-16 DIAGNOSIS — R5381 Other malaise: Secondary | ICD-10-CM | POA: Diagnosis not present

## 2023-05-16 DIAGNOSIS — F411 Generalized anxiety disorder: Secondary | ICD-10-CM | POA: Diagnosis not present

## 2023-05-16 DIAGNOSIS — I10 Essential (primary) hypertension: Secondary | ICD-10-CM | POA: Diagnosis not present

## 2023-05-16 DIAGNOSIS — F331 Major depressive disorder, recurrent, moderate: Secondary | ICD-10-CM | POA: Diagnosis not present

## 2023-05-16 DIAGNOSIS — H6120 Impacted cerumen, unspecified ear: Secondary | ICD-10-CM | POA: Diagnosis not present

## 2023-05-25 DIAGNOSIS — M6281 Muscle weakness (generalized): Secondary | ICD-10-CM | POA: Diagnosis not present

## 2023-05-25 DIAGNOSIS — R41841 Cognitive communication deficit: Secondary | ICD-10-CM | POA: Diagnosis not present

## 2023-05-25 DIAGNOSIS — F411 Generalized anxiety disorder: Secondary | ICD-10-CM | POA: Diagnosis not present

## 2023-05-25 DIAGNOSIS — R278 Other lack of coordination: Secondary | ICD-10-CM | POA: Diagnosis not present

## 2023-05-25 DIAGNOSIS — F331 Major depressive disorder, recurrent, moderate: Secondary | ICD-10-CM | POA: Diagnosis not present

## 2023-05-26 DIAGNOSIS — R41841 Cognitive communication deficit: Secondary | ICD-10-CM | POA: Diagnosis not present

## 2023-05-26 DIAGNOSIS — R278 Other lack of coordination: Secondary | ICD-10-CM | POA: Diagnosis not present

## 2023-05-26 DIAGNOSIS — M6281 Muscle weakness (generalized): Secondary | ICD-10-CM | POA: Diagnosis not present

## 2023-05-27 DIAGNOSIS — R41841 Cognitive communication deficit: Secondary | ICD-10-CM | POA: Diagnosis not present

## 2023-05-27 DIAGNOSIS — R278 Other lack of coordination: Secondary | ICD-10-CM | POA: Diagnosis not present

## 2023-05-27 DIAGNOSIS — M6281 Muscle weakness (generalized): Secondary | ICD-10-CM | POA: Diagnosis not present

## 2023-05-30 DIAGNOSIS — R278 Other lack of coordination: Secondary | ICD-10-CM | POA: Diagnosis not present

## 2023-05-30 DIAGNOSIS — M6281 Muscle weakness (generalized): Secondary | ICD-10-CM | POA: Diagnosis not present

## 2023-05-30 DIAGNOSIS — R41841 Cognitive communication deficit: Secondary | ICD-10-CM | POA: Diagnosis not present

## 2023-05-31 DIAGNOSIS — R278 Other lack of coordination: Secondary | ICD-10-CM | POA: Diagnosis not present

## 2023-05-31 DIAGNOSIS — R41841 Cognitive communication deficit: Secondary | ICD-10-CM | POA: Diagnosis not present

## 2023-05-31 DIAGNOSIS — M6281 Muscle weakness (generalized): Secondary | ICD-10-CM | POA: Diagnosis not present

## 2023-06-01 DIAGNOSIS — R41841 Cognitive communication deficit: Secondary | ICD-10-CM | POA: Diagnosis not present

## 2023-06-01 DIAGNOSIS — M6281 Muscle weakness (generalized): Secondary | ICD-10-CM | POA: Diagnosis not present

## 2023-06-01 DIAGNOSIS — R278 Other lack of coordination: Secondary | ICD-10-CM | POA: Diagnosis not present

## 2023-06-02 DIAGNOSIS — M6281 Muscle weakness (generalized): Secondary | ICD-10-CM | POA: Diagnosis not present

## 2023-06-02 DIAGNOSIS — R41841 Cognitive communication deficit: Secondary | ICD-10-CM | POA: Diagnosis not present

## 2023-06-02 DIAGNOSIS — R278 Other lack of coordination: Secondary | ICD-10-CM | POA: Diagnosis not present

## 2023-06-03 DIAGNOSIS — R41841 Cognitive communication deficit: Secondary | ICD-10-CM | POA: Diagnosis not present

## 2023-06-03 DIAGNOSIS — M6281 Muscle weakness (generalized): Secondary | ICD-10-CM | POA: Diagnosis not present

## 2023-06-03 DIAGNOSIS — R278 Other lack of coordination: Secondary | ICD-10-CM | POA: Diagnosis not present

## 2023-06-06 DIAGNOSIS — R41841 Cognitive communication deficit: Secondary | ICD-10-CM | POA: Diagnosis not present

## 2023-06-06 DIAGNOSIS — M6281 Muscle weakness (generalized): Secondary | ICD-10-CM | POA: Diagnosis not present

## 2023-06-06 DIAGNOSIS — R278 Other lack of coordination: Secondary | ICD-10-CM | POA: Diagnosis not present

## 2023-06-07 DIAGNOSIS — M6281 Muscle weakness (generalized): Secondary | ICD-10-CM | POA: Diagnosis not present

## 2023-06-07 DIAGNOSIS — R41841 Cognitive communication deficit: Secondary | ICD-10-CM | POA: Diagnosis not present

## 2023-06-07 DIAGNOSIS — R278 Other lack of coordination: Secondary | ICD-10-CM | POA: Diagnosis not present

## 2023-06-08 DIAGNOSIS — R278 Other lack of coordination: Secondary | ICD-10-CM | POA: Diagnosis not present

## 2023-06-08 DIAGNOSIS — M6281 Muscle weakness (generalized): Secondary | ICD-10-CM | POA: Diagnosis not present

## 2023-06-08 DIAGNOSIS — F411 Generalized anxiety disorder: Secondary | ICD-10-CM | POA: Diagnosis not present

## 2023-06-08 DIAGNOSIS — R41841 Cognitive communication deficit: Secondary | ICD-10-CM | POA: Diagnosis not present

## 2023-06-08 DIAGNOSIS — F331 Major depressive disorder, recurrent, moderate: Secondary | ICD-10-CM | POA: Diagnosis not present

## 2023-06-08 DIAGNOSIS — F0390 Unspecified dementia without behavioral disturbance: Secondary | ICD-10-CM | POA: Diagnosis not present

## 2023-06-09 DIAGNOSIS — R41841 Cognitive communication deficit: Secondary | ICD-10-CM | POA: Diagnosis not present

## 2023-06-09 DIAGNOSIS — R278 Other lack of coordination: Secondary | ICD-10-CM | POA: Diagnosis not present

## 2023-06-09 DIAGNOSIS — M6281 Muscle weakness (generalized): Secondary | ICD-10-CM | POA: Diagnosis not present

## 2023-06-10 DIAGNOSIS — R278 Other lack of coordination: Secondary | ICD-10-CM | POA: Diagnosis not present

## 2023-06-10 DIAGNOSIS — R41841 Cognitive communication deficit: Secondary | ICD-10-CM | POA: Diagnosis not present

## 2023-06-10 DIAGNOSIS — M6281 Muscle weakness (generalized): Secondary | ICD-10-CM | POA: Diagnosis not present

## 2023-06-13 DIAGNOSIS — R41841 Cognitive communication deficit: Secondary | ICD-10-CM | POA: Diagnosis not present

## 2023-06-13 DIAGNOSIS — M6281 Muscle weakness (generalized): Secondary | ICD-10-CM | POA: Diagnosis not present

## 2023-06-13 DIAGNOSIS — R278 Other lack of coordination: Secondary | ICD-10-CM | POA: Diagnosis not present

## 2023-06-14 DIAGNOSIS — R41841 Cognitive communication deficit: Secondary | ICD-10-CM | POA: Diagnosis not present

## 2023-06-14 DIAGNOSIS — R278 Other lack of coordination: Secondary | ICD-10-CM | POA: Diagnosis not present

## 2023-06-14 DIAGNOSIS — F411 Generalized anxiety disorder: Secondary | ICD-10-CM | POA: Diagnosis not present

## 2023-06-14 DIAGNOSIS — F331 Major depressive disorder, recurrent, moderate: Secondary | ICD-10-CM | POA: Diagnosis not present

## 2023-06-14 DIAGNOSIS — M6281 Muscle weakness (generalized): Secondary | ICD-10-CM | POA: Diagnosis not present

## 2023-06-15 DIAGNOSIS — I10 Essential (primary) hypertension: Secondary | ICD-10-CM | POA: Diagnosis not present

## 2023-06-15 DIAGNOSIS — F0390 Unspecified dementia without behavioral disturbance: Secondary | ICD-10-CM | POA: Diagnosis not present

## 2023-06-15 DIAGNOSIS — E039 Hypothyroidism, unspecified: Secondary | ICD-10-CM | POA: Diagnosis not present

## 2023-06-15 DIAGNOSIS — F331 Major depressive disorder, recurrent, moderate: Secondary | ICD-10-CM | POA: Diagnosis not present

## 2023-06-15 DIAGNOSIS — F411 Generalized anxiety disorder: Secondary | ICD-10-CM | POA: Diagnosis not present

## 2023-06-15 DIAGNOSIS — R5381 Other malaise: Secondary | ICD-10-CM | POA: Diagnosis not present

## 2023-06-15 DIAGNOSIS — M6281 Muscle weakness (generalized): Secondary | ICD-10-CM | POA: Diagnosis not present

## 2023-06-15 DIAGNOSIS — N183 Chronic kidney disease, stage 3 unspecified: Secondary | ICD-10-CM | POA: Diagnosis not present

## 2023-06-15 DIAGNOSIS — R262 Difficulty in walking, not elsewhere classified: Secondary | ICD-10-CM | POA: Diagnosis not present

## 2023-06-15 DIAGNOSIS — R41841 Cognitive communication deficit: Secondary | ICD-10-CM | POA: Diagnosis not present

## 2023-06-15 DIAGNOSIS — R4182 Altered mental status, unspecified: Secondary | ICD-10-CM | POA: Diagnosis not present

## 2023-06-16 DIAGNOSIS — N390 Urinary tract infection, site not specified: Secondary | ICD-10-CM | POA: Diagnosis not present

## 2023-06-27 DIAGNOSIS — Z23 Encounter for immunization: Secondary | ICD-10-CM | POA: Diagnosis not present

## 2023-07-05 DIAGNOSIS — F411 Generalized anxiety disorder: Secondary | ICD-10-CM | POA: Diagnosis not present

## 2023-07-05 DIAGNOSIS — F331 Major depressive disorder, recurrent, moderate: Secondary | ICD-10-CM | POA: Diagnosis not present

## 2023-07-06 DIAGNOSIS — F331 Major depressive disorder, recurrent, moderate: Secondary | ICD-10-CM | POA: Diagnosis not present

## 2023-07-06 DIAGNOSIS — F411 Generalized anxiety disorder: Secondary | ICD-10-CM | POA: Diagnosis not present

## 2023-07-06 DIAGNOSIS — F0390 Unspecified dementia without behavioral disturbance: Secondary | ICD-10-CM | POA: Diagnosis not present

## 2023-07-11 DIAGNOSIS — F331 Major depressive disorder, recurrent, moderate: Secondary | ICD-10-CM | POA: Diagnosis not present

## 2023-07-11 DIAGNOSIS — F411 Generalized anxiety disorder: Secondary | ICD-10-CM | POA: Diagnosis not present

## 2023-07-19 DIAGNOSIS — F411 Generalized anxiety disorder: Secondary | ICD-10-CM | POA: Diagnosis not present

## 2023-07-19 DIAGNOSIS — F331 Major depressive disorder, recurrent, moderate: Secondary | ICD-10-CM | POA: Diagnosis not present

## 2023-07-25 DIAGNOSIS — F0390 Unspecified dementia without behavioral disturbance: Secondary | ICD-10-CM | POA: Diagnosis not present

## 2023-07-25 DIAGNOSIS — F329 Major depressive disorder, single episode, unspecified: Secondary | ICD-10-CM | POA: Diagnosis not present

## 2023-07-25 DIAGNOSIS — R569 Unspecified convulsions: Secondary | ICD-10-CM | POA: Diagnosis not present

## 2023-07-26 DIAGNOSIS — F331 Major depressive disorder, recurrent, moderate: Secondary | ICD-10-CM | POA: Diagnosis not present

## 2023-07-26 DIAGNOSIS — F411 Generalized anxiety disorder: Secondary | ICD-10-CM | POA: Diagnosis not present

## 2023-08-02 DIAGNOSIS — F411 Generalized anxiety disorder: Secondary | ICD-10-CM | POA: Diagnosis not present

## 2023-08-02 DIAGNOSIS — F331 Major depressive disorder, recurrent, moderate: Secondary | ICD-10-CM | POA: Diagnosis not present

## 2023-08-03 DIAGNOSIS — F0390 Unspecified dementia without behavioral disturbance: Secondary | ICD-10-CM | POA: Diagnosis not present

## 2023-08-03 DIAGNOSIS — F411 Generalized anxiety disorder: Secondary | ICD-10-CM | POA: Diagnosis not present

## 2023-08-03 DIAGNOSIS — F331 Major depressive disorder, recurrent, moderate: Secondary | ICD-10-CM | POA: Diagnosis not present

## 2023-08-08 DIAGNOSIS — F331 Major depressive disorder, recurrent, moderate: Secondary | ICD-10-CM | POA: Diagnosis not present

## 2023-08-08 DIAGNOSIS — F411 Generalized anxiety disorder: Secondary | ICD-10-CM | POA: Diagnosis not present

## 2023-08-11 DIAGNOSIS — H40051 Ocular hypertension, right eye: Secondary | ICD-10-CM | POA: Diagnosis not present

## 2023-08-11 DIAGNOSIS — Z961 Presence of intraocular lens: Secondary | ICD-10-CM | POA: Diagnosis not present

## 2023-08-16 DIAGNOSIS — F331 Major depressive disorder, recurrent, moderate: Secondary | ICD-10-CM | POA: Diagnosis not present

## 2023-08-16 DIAGNOSIS — M6259 Muscle wasting and atrophy, not elsewhere classified, multiple sites: Secondary | ICD-10-CM | POA: Diagnosis not present

## 2023-08-16 DIAGNOSIS — F411 Generalized anxiety disorder: Secondary | ICD-10-CM | POA: Diagnosis not present

## 2023-08-17 DIAGNOSIS — M6259 Muscle wasting and atrophy, not elsewhere classified, multiple sites: Secondary | ICD-10-CM | POA: Diagnosis not present

## 2023-08-18 DIAGNOSIS — M6259 Muscle wasting and atrophy, not elsewhere classified, multiple sites: Secondary | ICD-10-CM | POA: Diagnosis not present

## 2023-08-22 DIAGNOSIS — M6259 Muscle wasting and atrophy, not elsewhere classified, multiple sites: Secondary | ICD-10-CM | POA: Diagnosis not present

## 2023-08-22 DIAGNOSIS — F331 Major depressive disorder, recurrent, moderate: Secondary | ICD-10-CM | POA: Diagnosis not present

## 2023-08-22 DIAGNOSIS — F411 Generalized anxiety disorder: Secondary | ICD-10-CM | POA: Diagnosis not present

## 2023-08-23 DIAGNOSIS — M6259 Muscle wasting and atrophy, not elsewhere classified, multiple sites: Secondary | ICD-10-CM | POA: Diagnosis not present

## 2023-08-25 DIAGNOSIS — M6259 Muscle wasting and atrophy, not elsewhere classified, multiple sites: Secondary | ICD-10-CM | POA: Diagnosis not present

## 2023-08-25 DIAGNOSIS — R5381 Other malaise: Secondary | ICD-10-CM | POA: Diagnosis not present

## 2023-08-25 DIAGNOSIS — E039 Hypothyroidism, unspecified: Secondary | ICD-10-CM | POA: Diagnosis not present

## 2023-08-25 DIAGNOSIS — I10 Essential (primary) hypertension: Secondary | ICD-10-CM | POA: Diagnosis not present

## 2023-08-25 DIAGNOSIS — N183 Chronic kidney disease, stage 3 unspecified: Secondary | ICD-10-CM | POA: Diagnosis not present

## 2023-08-25 DIAGNOSIS — K219 Gastro-esophageal reflux disease without esophagitis: Secondary | ICD-10-CM | POA: Diagnosis not present

## 2023-08-26 DIAGNOSIS — M6259 Muscle wasting and atrophy, not elsewhere classified, multiple sites: Secondary | ICD-10-CM | POA: Diagnosis not present

## 2023-08-29 DIAGNOSIS — F331 Major depressive disorder, recurrent, moderate: Secondary | ICD-10-CM | POA: Diagnosis not present

## 2023-08-29 DIAGNOSIS — M6259 Muscle wasting and atrophy, not elsewhere classified, multiple sites: Secondary | ICD-10-CM | POA: Diagnosis not present

## 2023-08-29 DIAGNOSIS — F411 Generalized anxiety disorder: Secondary | ICD-10-CM | POA: Diagnosis not present

## 2023-08-30 DIAGNOSIS — M6259 Muscle wasting and atrophy, not elsewhere classified, multiple sites: Secondary | ICD-10-CM | POA: Diagnosis not present

## 2023-08-31 DIAGNOSIS — M6259 Muscle wasting and atrophy, not elsewhere classified, multiple sites: Secondary | ICD-10-CM | POA: Diagnosis not present

## 2023-08-31 DIAGNOSIS — F331 Major depressive disorder, recurrent, moderate: Secondary | ICD-10-CM | POA: Diagnosis not present

## 2023-08-31 DIAGNOSIS — F0390 Unspecified dementia without behavioral disturbance: Secondary | ICD-10-CM | POA: Diagnosis not present

## 2023-08-31 DIAGNOSIS — F411 Generalized anxiety disorder: Secondary | ICD-10-CM | POA: Diagnosis not present

## 2023-09-01 DIAGNOSIS — I10 Essential (primary) hypertension: Secondary | ICD-10-CM | POA: Diagnosis not present

## 2023-09-01 DIAGNOSIS — D649 Anemia, unspecified: Secondary | ICD-10-CM | POA: Diagnosis not present

## 2023-09-02 DIAGNOSIS — M6259 Muscle wasting and atrophy, not elsewhere classified, multiple sites: Secondary | ICD-10-CM | POA: Diagnosis not present

## 2023-09-03 DIAGNOSIS — M6259 Muscle wasting and atrophy, not elsewhere classified, multiple sites: Secondary | ICD-10-CM | POA: Diagnosis not present

## 2023-09-05 DIAGNOSIS — M6259 Muscle wasting and atrophy, not elsewhere classified, multiple sites: Secondary | ICD-10-CM | POA: Diagnosis not present

## 2023-09-06 DIAGNOSIS — R918 Other nonspecific abnormal finding of lung field: Secondary | ICD-10-CM | POA: Diagnosis not present

## 2023-09-06 DIAGNOSIS — M6259 Muscle wasting and atrophy, not elsewhere classified, multiple sites: Secondary | ICD-10-CM | POA: Diagnosis not present

## 2023-09-06 DIAGNOSIS — N390 Urinary tract infection, site not specified: Secondary | ICD-10-CM | POA: Diagnosis not present

## 2023-09-07 DIAGNOSIS — N184 Chronic kidney disease, stage 4 (severe): Secondary | ICD-10-CM | POA: Diagnosis not present

## 2023-09-07 DIAGNOSIS — M6259 Muscle wasting and atrophy, not elsewhere classified, multiple sites: Secondary | ICD-10-CM | POA: Diagnosis not present

## 2023-09-07 DIAGNOSIS — N390 Urinary tract infection, site not specified: Secondary | ICD-10-CM | POA: Diagnosis not present

## 2023-09-07 DIAGNOSIS — R7981 Abnormal blood-gas level: Secondary | ICD-10-CM | POA: Diagnosis not present

## 2023-09-08 DIAGNOSIS — M6259 Muscle wasting and atrophy, not elsewhere classified, multiple sites: Secondary | ICD-10-CM | POA: Diagnosis not present

## 2023-09-09 DIAGNOSIS — M6259 Muscle wasting and atrophy, not elsewhere classified, multiple sites: Secondary | ICD-10-CM | POA: Diagnosis not present

## 2023-09-12 DIAGNOSIS — M6259 Muscle wasting and atrophy, not elsewhere classified, multiple sites: Secondary | ICD-10-CM | POA: Diagnosis not present

## 2023-09-13 DIAGNOSIS — M6259 Muscle wasting and atrophy, not elsewhere classified, multiple sites: Secondary | ICD-10-CM | POA: Diagnosis not present

## 2023-09-16 DIAGNOSIS — I10 Essential (primary) hypertension: Secondary | ICD-10-CM | POA: Diagnosis not present

## 2023-09-16 DIAGNOSIS — R195 Other fecal abnormalities: Secondary | ICD-10-CM | POA: Diagnosis not present

## 2023-09-16 DIAGNOSIS — E039 Hypothyroidism, unspecified: Secondary | ICD-10-CM | POA: Diagnosis not present

## 2023-09-17 DIAGNOSIS — M6259 Muscle wasting and atrophy, not elsewhere classified, multiple sites: Secondary | ICD-10-CM | POA: Diagnosis not present

## 2023-09-17 DIAGNOSIS — A0471 Enterocolitis due to Clostridium difficile, recurrent: Secondary | ICD-10-CM | POA: Diagnosis not present

## 2023-09-19 DIAGNOSIS — M6259 Muscle wasting and atrophy, not elsewhere classified, multiple sites: Secondary | ICD-10-CM | POA: Diagnosis not present

## 2023-09-20 DIAGNOSIS — M6259 Muscle wasting and atrophy, not elsewhere classified, multiple sites: Secondary | ICD-10-CM | POA: Diagnosis not present
